# Patient Record
Sex: Male | Born: 1970
Health system: Southern US, Community
[De-identification: ages and names within clinical notes are randomized; demographics above are authoritative.]

## PROBLEM LIST (undated history)

## (undated) DIAGNOSIS — K5909 Other constipation: Secondary | ICD-10-CM

## (undated) DIAGNOSIS — R55 Syncope and collapse: Secondary | ICD-10-CM

## (undated) DIAGNOSIS — M109 Gout, unspecified: Secondary | ICD-10-CM

## (undated) DIAGNOSIS — E785 Hyperlipidemia, unspecified: Secondary | ICD-10-CM

## (undated) DIAGNOSIS — K219 Gastro-esophageal reflux disease without esophagitis: Secondary | ICD-10-CM

## (undated) DIAGNOSIS — G4733 Obstructive sleep apnea (adult) (pediatric): Secondary | ICD-10-CM

## (undated) DIAGNOSIS — Z9109 Other allergy status, other than to drugs and biological substances: Secondary | ICD-10-CM

## (undated) DIAGNOSIS — I10 Essential (primary) hypertension: Secondary | ICD-10-CM

## (undated) DIAGNOSIS — E669 Obesity, unspecified: Secondary | ICD-10-CM

## (undated) HISTORY — DX: Other constipation: K59.09

## (undated) HISTORY — DX: Other allergy status, other than to drugs and biological substances: Z91.09

## (undated) HISTORY — DX: Essential (primary) hypertension: I10

## (undated) HISTORY — DX: Obesity, unspecified: E66.9

## (undated) HISTORY — DX: Gastro-esophageal reflux disease without esophagitis: K21.9

## (undated) HISTORY — DX: Hyperlipidemia, unspecified: E78.5

## (undated) HISTORY — PX: VASECTOMY: SHX75

## (undated) HISTORY — DX: Syncope and collapse: R55

## (undated) HISTORY — DX: Obstructive sleep apnea (adult) (pediatric): G47.33

## (undated) HISTORY — DX: Gout, unspecified: M10.9

## (undated) HISTORY — PX: APPENDECTOMY: SHX54

## (undated) HISTORY — PX: OTHER SURGICAL HISTORY: SHX169

---

## 2005-01-08 ENCOUNTER — Emergency Department (HOSPITAL_COMMUNITY): Admission: EM | Admit: 2005-01-08 | Discharge: 2005-01-08 | Payer: Self-pay | Admitting: Family Medicine

## 2005-05-08 ENCOUNTER — Ambulatory Visit: Payer: Self-pay | Admitting: Internal Medicine

## 2005-07-24 ENCOUNTER — Ambulatory Visit: Payer: Self-pay | Admitting: Internal Medicine

## 2005-10-03 ENCOUNTER — Emergency Department (HOSPITAL_COMMUNITY): Admission: EM | Admit: 2005-10-03 | Discharge: 2005-10-03 | Payer: Self-pay | Admitting: Family Medicine

## 2006-05-11 ENCOUNTER — Ambulatory Visit: Payer: Self-pay | Admitting: Internal Medicine

## 2006-08-31 ENCOUNTER — Ambulatory Visit: Payer: Self-pay | Admitting: Internal Medicine

## 2006-09-16 ENCOUNTER — Emergency Department (HOSPITAL_COMMUNITY): Admission: EM | Admit: 2006-09-16 | Discharge: 2006-09-16 | Payer: Self-pay | Admitting: Emergency Medicine

## 2006-10-25 ENCOUNTER — Ambulatory Visit: Payer: Self-pay | Admitting: Internal Medicine

## 2006-12-16 ENCOUNTER — Emergency Department (HOSPITAL_COMMUNITY): Admission: EM | Admit: 2006-12-16 | Discharge: 2006-12-16 | Payer: Self-pay | Admitting: Family Medicine

## 2007-01-06 ENCOUNTER — Emergency Department (HOSPITAL_COMMUNITY): Admission: EM | Admit: 2007-01-06 | Discharge: 2007-01-06 | Payer: Self-pay | Admitting: Family Medicine

## 2007-06-01 ENCOUNTER — Ambulatory Visit: Payer: Self-pay | Admitting: Family Medicine

## 2007-07-04 ENCOUNTER — Encounter: Payer: Self-pay | Admitting: Internal Medicine

## 2007-07-04 DIAGNOSIS — I10 Essential (primary) hypertension: Secondary | ICD-10-CM | POA: Insufficient documentation

## 2007-07-04 DIAGNOSIS — E785 Hyperlipidemia, unspecified: Secondary | ICD-10-CM | POA: Insufficient documentation

## 2007-07-05 DIAGNOSIS — E669 Obesity, unspecified: Secondary | ICD-10-CM | POA: Insufficient documentation

## 2007-07-05 DIAGNOSIS — K219 Gastro-esophageal reflux disease without esophagitis: Secondary | ICD-10-CM | POA: Insufficient documentation

## 2008-01-09 ENCOUNTER — Ambulatory Visit: Payer: Self-pay | Admitting: Internal Medicine

## 2008-01-09 DIAGNOSIS — J309 Allergic rhinitis, unspecified: Secondary | ICD-10-CM | POA: Insufficient documentation

## 2008-01-11 ENCOUNTER — Telehealth (INDEPENDENT_AMBULATORY_CARE_PROVIDER_SITE_OTHER): Payer: Self-pay | Admitting: Family Medicine

## 2008-01-11 ENCOUNTER — Emergency Department (HOSPITAL_COMMUNITY): Admission: EM | Admit: 2008-01-11 | Discharge: 2008-01-11 | Payer: Self-pay | Admitting: Family Medicine

## 2008-01-19 ENCOUNTER — Emergency Department (HOSPITAL_COMMUNITY): Admission: EM | Admit: 2008-01-19 | Discharge: 2008-01-19 | Payer: Self-pay | Admitting: Emergency Medicine

## 2008-11-20 ENCOUNTER — Ambulatory Visit: Payer: Self-pay | Admitting: Internal Medicine

## 2008-11-20 DIAGNOSIS — R079 Chest pain, unspecified: Secondary | ICD-10-CM | POA: Insufficient documentation

## 2008-11-20 DIAGNOSIS — R0609 Other forms of dyspnea: Secondary | ICD-10-CM | POA: Insufficient documentation

## 2008-11-20 DIAGNOSIS — R0989 Other specified symptoms and signs involving the circulatory and respiratory systems: Secondary | ICD-10-CM

## 2008-11-26 ENCOUNTER — Encounter: Payer: Self-pay | Admitting: Internal Medicine

## 2008-11-30 ENCOUNTER — Ambulatory Visit: Payer: Self-pay

## 2008-11-30 ENCOUNTER — Encounter: Payer: Self-pay | Admitting: Internal Medicine

## 2008-12-01 ENCOUNTER — Emergency Department (HOSPITAL_COMMUNITY): Admission: EM | Admit: 2008-12-01 | Discharge: 2008-12-01 | Payer: Self-pay | Admitting: Emergency Medicine

## 2008-12-01 ENCOUNTER — Telehealth: Payer: Self-pay | Admitting: Internal Medicine

## 2009-02-23 ENCOUNTER — Ambulatory Visit: Payer: Self-pay | Admitting: Internal Medicine

## 2009-02-23 DIAGNOSIS — K5289 Other specified noninfective gastroenteritis and colitis: Secondary | ICD-10-CM | POA: Insufficient documentation

## 2009-02-26 ENCOUNTER — Ambulatory Visit: Payer: Self-pay | Admitting: Internal Medicine

## 2009-02-26 LAB — CONVERTED CEMR LAB
ALT: 39 units/L (ref 0–53)
AST: 24 units/L (ref 0–37)
Albumin: 3.8 g/dL (ref 3.5–5.2)
Alkaline Phosphatase: 59 units/L (ref 39–117)
BUN: 16 mg/dL (ref 6–23)
Basophils Absolute: 0 10*3/uL (ref 0.0–0.1)
Basophils Relative: 0.9 % (ref 0.0–3.0)
Bilirubin Urine: NEGATIVE
Bilirubin, Direct: 0.2 mg/dL (ref 0.0–0.3)
CO2: 30 meq/L (ref 19–32)
Calcium: 9.4 mg/dL (ref 8.4–10.5)
Chloride: 108 meq/L (ref 96–112)
Cholesterol: 170 mg/dL (ref 0–200)
Creatinine, Ser: 1 mg/dL (ref 0.4–1.5)
Eosinophils Absolute: 0.1 10*3/uL (ref 0.0–0.7)
Eosinophils Relative: 1.4 % (ref 0.0–5.0)
GFR calc non Af Amer: 89.3 mL/min (ref >60)
Glucose, Bld: 110 mg/dL — ABNORMAL HIGH (ref 70–99)
HCT: 38 % — ABNORMAL LOW (ref 39.0–52.0)
HDL: 29.5 mg/dL — ABNORMAL LOW (ref >39.00)
Hemoglobin: 13.2 g/dL (ref 13.0–17.0)
Ketones, ur: NEGATIVE mg/dL
LDL Cholesterol: 116 mg/dL — ABNORMAL HIGH (ref 0–99)
Leukocytes, UA: NEGATIVE
Lymphocytes Relative: 28.9 % (ref 12.0–46.0)
Lymphs Abs: 1.3 10*3/uL (ref 0.7–4.0)
MCHC: 34.8 g/dL (ref 30.0–36.0)
MCV: 87.3 fL (ref 78.0–100.0)
Monocytes Absolute: 0.7 10*3/uL (ref 0.1–1.0)
Monocytes Relative: 14.8 % — ABNORMAL HIGH (ref 3.0–12.0)
Neutro Abs: 2.3 10*3/uL (ref 1.4–7.7)
Neutrophils Relative %: 54 % (ref 43.0–77.0)
Nitrite: NEGATIVE
Platelets: 222 10*3/uL (ref 150.0–400.0)
Potassium: 4.3 meq/L (ref 3.5–5.1)
RBC: 4.35 M/uL (ref 4.22–5.81)
RDW: 12.7 % (ref 11.5–14.6)
Sodium: 143 meq/L (ref 135–145)
Specific Gravity, Urine: 1.025 (ref 1.000–1.030)
TSH: 1.8 microintl units/mL (ref 0.35–5.50)
Total Bilirubin: 0.8 mg/dL (ref 0.3–1.2)
Total CHOL/HDL Ratio: 6
Total Protein, Urine: NEGATIVE mg/dL
Total Protein: 7.1 g/dL (ref 6.0–8.3)
Triglycerides: 124 mg/dL (ref 0.0–149.0)
Urine Glucose: NEGATIVE mg/dL
Urobilinogen, UA: 0.2 (ref 0.0–1.0)
VLDL: 24.8 mg/dL (ref 0.0–40.0)
WBC: 4.4 10*3/uL — ABNORMAL LOW (ref 4.5–10.5)
pH: 5.5 (ref 5.0–8.0)

## 2009-05-02 ENCOUNTER — Emergency Department (HOSPITAL_COMMUNITY): Admission: EM | Admit: 2009-05-02 | Discharge: 2009-05-02 | Payer: Self-pay | Admitting: Family Medicine

## 2009-10-13 ENCOUNTER — Ambulatory Visit: Payer: Self-pay | Admitting: Internal Medicine

## 2009-10-13 DIAGNOSIS — M25569 Pain in unspecified knee: Secondary | ICD-10-CM | POA: Insufficient documentation

## 2009-10-24 ENCOUNTER — Ambulatory Visit (HOSPITAL_BASED_OUTPATIENT_CLINIC_OR_DEPARTMENT_OTHER): Admission: RE | Admit: 2009-10-24 | Discharge: 2009-10-24 | Payer: Self-pay | Admitting: Otolaryngology

## 2009-10-31 ENCOUNTER — Ambulatory Visit: Payer: Self-pay | Admitting: Internal Medicine

## 2009-12-27 ENCOUNTER — Ambulatory Visit: Payer: Self-pay | Admitting: Endocrinology

## 2010-04-01 ENCOUNTER — Ambulatory Visit: Payer: Self-pay | Admitting: Internal Medicine

## 2010-04-02 LAB — CONVERTED CEMR LAB
ALT: 40 units/L (ref 0–53)
AST: 25 units/L (ref 0–37)
Albumin: 4 g/dL (ref 3.5–5.2)
Alkaline Phosphatase: 44 units/L (ref 39–117)
BUN: 18 mg/dL (ref 6–23)
Basophils Absolute: 0 10*3/uL (ref 0.0–0.1)
Basophils Relative: 0.5 % (ref 0.0–3.0)
Bilirubin Urine: NEGATIVE
Bilirubin, Direct: 0.2 mg/dL (ref 0.0–0.3)
CO2: 31 meq/L (ref 19–32)
Calcium: 9.6 mg/dL (ref 8.4–10.5)
Chloride: 107 meq/L (ref 96–112)
Cholesterol: 208 mg/dL — ABNORMAL HIGH (ref 0–200)
Creatinine, Ser: 1 mg/dL (ref 0.4–1.5)
Direct LDL: 139.3 mg/dL
Eosinophils Absolute: 0.1 10*3/uL (ref 0.0–0.7)
Eosinophils Relative: 1.5 % (ref 0.0–5.0)
GFR calc non Af Amer: 86.77 mL/min (ref >60)
Glucose, Bld: 113 mg/dL — ABNORMAL HIGH (ref 70–99)
HCT: 38.7 % — ABNORMAL LOW (ref 39.0–52.0)
HDL: 41.7 mg/dL (ref >39.00)
Hemoglobin: 13.1 g/dL (ref 13.0–17.0)
Ketones, ur: NEGATIVE mg/dL
Leukocytes, UA: NEGATIVE
Lymphocytes Relative: 24.8 % (ref 12.0–46.0)
Lymphs Abs: 1.5 10*3/uL (ref 0.7–4.0)
MCHC: 33.9 g/dL (ref 30.0–36.0)
MCV: 86.8 fL (ref 78.0–100.0)
Monocytes Absolute: 0.6 10*3/uL (ref 0.1–1.0)
Monocytes Relative: 8.9 % (ref 3.0–12.0)
Neutro Abs: 4 10*3/uL (ref 1.4–7.7)
Neutrophils Relative %: 64.3 % (ref 43.0–77.0)
Nitrite: NEGATIVE
Platelets: 220 10*3/uL (ref 150.0–400.0)
Potassium: 4.4 meq/L (ref 3.5–5.1)
RBC: 4.45 M/uL (ref 4.22–5.81)
RDW: 13.7 % (ref 11.5–14.6)
Sodium: 143 meq/L (ref 135–145)
Specific Gravity, Urine: >=1.03 (ref 1.000–1.030)
TSH: 1.69 microintl units/mL (ref 0.35–5.50)
Total Bilirubin: 0.9 mg/dL (ref 0.3–1.2)
Total CHOL/HDL Ratio: 5
Total Protein, Urine: NEGATIVE mg/dL
Total Protein: 6.6 g/dL (ref 6.0–8.3)
Triglycerides: 178 mg/dL — ABNORMAL HIGH (ref 0.0–149.0)
Urine Glucose: NEGATIVE mg/dL
Urobilinogen, UA: 0.2 (ref 0.0–1.0)
VLDL: 35.6 mg/dL (ref 0.0–40.0)
WBC: 6.2 10*3/uL (ref 4.5–10.5)
pH: 5.5 (ref 5.0–8.0)

## 2010-04-08 ENCOUNTER — Ambulatory Visit: Payer: Self-pay | Admitting: Internal Medicine

## 2010-04-20 ENCOUNTER — Ambulatory Visit: Payer: Self-pay | Admitting: Internal Medicine

## 2010-07-22 ENCOUNTER — Telehealth: Payer: Self-pay | Admitting: Internal Medicine

## 2010-11-02 ENCOUNTER — Telehealth: Payer: Self-pay | Admitting: Internal Medicine

## 2010-12-08 NOTE — Assessment & Plan Note (Signed)
Summary: back/knee pain/#/cd   Vital Signs:  Patient profile:   40 year old male Height:      74 inches Weight:      267 pounds BMI:     34.40 O2 Sat:      97 % on Room air Temp:     98.9 degrees F oral Pulse rate:   70 / minute BP sitting:   130 / 90  (left arm) Cuff size:   large  Vitals Entered ByZella Ball Ewing (October 13, 2009 2:57 PM)  O2 Flow:  Room air CC: back and knee pain/RE   CC:  back and knee pain/RE.  History of Present Illness: here with gradually worsening now moderate chronic persistent right knee pain medially , worse with stooping and bending and standing;  no swelling , falls , fever or other trauma;  denies seeming unstable, or click or catch;  also c/o one year gradually worsening lower back pain now moderate as well with occasinal radiation to both legs but wihtout LE numbness or weakness or falls;  no bowel or bladder changes , fever, wt loss, night sweats.  Admits he thinks his wt overall up in past years may be an issue with his pain.  Back pain worse to bend, housework and lifting up to 50 lbs.  Also mentions the benicar is expensive and would like change if possible.  Pt denies CP, sob, doe, wheezing, orthopnea, pnd, worsening LE edema, palps, dizziness or syncope   Pt denies other new neuro symptoms such as headache, facial or extremity weakness   Problems Prior to Update: 1)  Knee Pain, Right  (ICD-719.46) 2)  Back Pain  (ICD-724.5) 3)  Gastroenteritis, Acute  (ICD-558.9) 4)  Syncope  (ICD-780.2) 5)  Preventive Health Care  (ICD-V70.0) 6)  Chest Pain  (ICD-786.50) 7)  Snoring  (ICD-786.09) 8)  Allergic Rhinitis  (ICD-477.9) 9)  Gerd  (ICD-530.81) 10)  Obesity  (ICD-278.00) 11)  Hypertension  (ICD-401.9) 12)  Hyperlipidemia  (ICD-272.4)  Medications Prior to Update: 1)  Adult Aspirin Ec Low Strength 81 Mg Tbec (Aspirin) .Marland Kitchen.. 1 Po Once Daily 2)  Benicar 40 Mg Tabs (Olmesartan Medoxomil) .Marland Kitchen.. 1 By Mouth Once Daily 3)  Lansoprazole 30 Mg Cpdr  (Lansoprazole) .Marland Kitchen.. 1 By Mouth Once Daily  Current Medications (verified): 1)  Adult Aspirin Ec Low Strength 81 Mg Tbec (Aspirin) .Marland Kitchen.. 1 Po Once Daily 2)  Losartan Potassium 50 Mg Tabs (Losartan Potassium) .Marland Kitchen.. 1po Once Daily 3)  Lansoprazole 30 Mg Cpdr (Lansoprazole) .Marland Kitchen.. 1 By Mouth Once Daily 4)  Naproxen 500 Mg Tabs (Naproxen) .Marland Kitchen.. 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   Head:  normocephalic and atraumatic.   Eyes:  vision grossly intact, pupils equal, and pupils round.   Ears:  R ear normal and L ear normal.   Nose:  no external deformity and no nasal discharge.   Mouth:  no gingival abnormalities and pharynx pink and moist.   Neck:  supple and no masses.   Lungs:  normal respiratory effort and normal breath sounds.   Heart:  normal rate and regular rhythm.   Msk:  no joint tenderness and no joint swelling., has diffuse lumbar paravertebral tender but no spine tenderness or swelling;  no buttock tender or post thigh tender; no rash or erythema ; right knee with mild joint effusion but NT and FROM, no click or catch apprec but has some crepitus Extremities:  no edema, no erythema    Impression & Recommendations:  Problem # 1:  BACK PAIN (ICD-724.5)  His updated medication list for this problem includes:    Adult Aspirin Ec Low Strength 81 Mg Tbec (Aspirin) .Marland Kitchen... 1 po once daily    Naproxen 500 Mg Tabs (Naproxen) .Marland Kitchen... 1 by mouth two times a day as  needed  Orders: Orthopedic Surgeon Referral (Ortho Surgeon) etiology unclear , treat as above, f/u any worsening signs or symptoms , refer ortho  Problem # 2:  KNEE PAIN, RIGHT (ICD-719.46)  His updated medication list for this problem includes:    Adult Aspirin Ec Low Strength 81 Mg Tbec (Aspirin) .Marland Kitchen... 1 po once daily    Naproxen 500 Mg Tabs (Naproxen) .Marland Kitchen... 1 by mouth two times a day as needed  Orders: Orthopedic Surgeon Referral (Ortho Surgeon) exact eiology unclear; treat as above, f/u any worsening signs or symptoms , refer ortho  Problem # 3:  HYPERTENSION (ICD-401.9)  His updated medication list for this problem includes:    Losartan Potassium 50 Mg Tabs (Losartan potassium) .Marland Kitchen... 1po once daily  BP today: 130/90 Prior BP: 110/70 (02/23/2009)  Labs Reviewed: K+: 4.3 (02/26/2009) Creat: : 1.0 (02/26/2009)   Chol: 170 (02/26/2009)   HDL: 29.50 (02/26/2009)   LDL: 116 (02/26/2009)   TG: 124.0 (02/26/2009) stable overall by hx and exam, ok to continue meds/tx as is   Complete Medication List: 1)  Adult Aspirin Ec Low Strength 81 Mg Tbec (Aspirin) .Marland Kitchen.. 1 po once daily 2)  Losartan Potassium 50 Mg Tabs (Losartan potassium) .Marland Kitchen.. 1po once daily 3)  Lansoprazole 30 Mg Cpdr (Lansoprazole) .Marland Kitchen.. 1 by mouth once daily 4)  Naproxen 500 Mg Tabs (Naproxen) .Marland Kitchen.. 1 by mouth two times a day as needed  Patient Instructions: 1)  change the benicar to losartan 50 mg per day 2)  Please take all new medications as prescribed 3)  Continue all previous medications as before this visit  4)  You will be contacted about the referral(s) to: Orthopedic 5)  Please schedule a follow-up appointment in 6 months with CPX labs Prescriptions: LOSARTAN POTASSIUM 50 MG TABS (LOSARTAN POTASSIUM) 1po once daily  #30 x 11   Entered and Authorized by:   Corwin Levins MD   Signed by:   Corwin Levins MD on 10/13/2009   Method used:   [facility] to        [facility]  [address] [phone][facility] (retail)       [address]       [place], [place]  [address]       Ph: [phone] or [phone]       Fax: [phone]   RxID:   [id number] NAPROXEN 500 MG TABS (NAPROXEN) 1 by mouth two times a day as needed  #60 x 2   Entered and Authorized by:   Corwin Levins MD   Signed by:   Corwin Levins MD on 10/13/2009   Method used:   [facility]  [address] [phone][facility] (retail)       [address]       [place], [place]  [address]       Ph: [phone] or [phone]       Fax: [phone]   RxID:   [id number]

## 2010-12-08 NOTE — Assessment & Plan Note (Signed)
Summary: WIFE STATES HE NEEDS A SLEEP STUDY/NWS   Vital Signs:  Patient Profile:   40 Years Old Male Weight:      270 pounds O2 Sat:      97 % O2 treatment:    Room Air Temp:     97.8 degrees F oral Pulse rate:   78 / minute BP sitting:   138 / 92  (left arm) Cuff size:   large  Vitals Entered By: Payton Spark CMA (November 20, 2008 8:45 AM)                 Chief Complaint:  wife requests sleep study.  History of Present Illness: BP at home and at the drug store usually over 140 to 150; admits to not following diet and non compliant with med but wife says he needs to get back on track with diet, excercise, meds and wt loss (wife is EMT); he has also had some mid and left chest discomfort, pressure, sharp and dull , not clearly worse with exertion and not pleuritic, has some left shoulder pain and not clear if associated; not assoc with sob, doe, diaphoresis, palp's or syncope; has quite a bit of snoring at night but not significant daytime somnolence per pt; intends to start with better diet, excercise and wt loss efforts    Prior Medications Reviewed Using: Patient Recall  Updated Prior Medication List: ADULT ASPIRIN EC LOW STRENGTH 81 MG TBEC (ASPIRIN) 1 po once daily BENICAR 40 MG TABS (OLMESARTAN MEDOXOMIL) 1 by mouth once daily  Current Allergies (reviewed today): No known allergies   Past Medical History:    Reviewed history from 01/09/2008 and no changes required:       Hyperlipidemia       Hypertension       Obesity       GERD       Allergic rhinitis       eczema  Past Surgical History:    Reviewed history from 07/04/2007 and no changes required:       Appendectomy   Family History:    Reviewed history from 01/09/2008 and no changes required:       stroke       DM       heart disease  Social History:    Reviewed history from 01/09/2008 and no changes required:       Married       Former Smoker       Alcohol use-yes       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   Head:     Normocephalic and atraumatic without obvious abnormalities. No apparent alopecia or balding. Eyes:     No corneal or conjunctival inflammation noted. EOMI. Perrla Ears:     External ear exam shows no significant lesions or deformities.  Otoscopic examination reveals clear canals, tympanic membranes are intact bilaterally without bulging, retraction, inflammation or discharge. Hearing is grossly normal bilaterally. Nose:     External nasal examination shows no deformity or inflammation. Nasal mucosa are pink and moist without lesions or exudates. Mouth:     Oral mucosa and oropharynx without lesions or exudates.  Teeth in good repair. Neck:     No deformities, masses, or tenderness noted. Lungs:     Normal respiratory effort, chest expands symmetrically. Lungs are  clear to auscultation, no crackles or wheezes. Heart:     Normal rate and regular rhythm. S1 and S2 normal without gallop, murmur, click, rub or other extra sounds. Abdomen:     Bowel sounds positive,abdomen soft and non-tender without masses, organomegaly or hernias noted. Msk:     no joint tenderness and no joint swelling.   Extremities:     no edema, no ulcers  Neurologic:     cranial nerves II-XII intact and strength normal in all extremities.      Impression & Recommendations:  Problem # 1:  CHEST PAIN (ICD-786.50) atypical pain - will refer for stress test Orders: Misc. Referral (Misc. Ref) EKG w/ Interpretation (93000)   Problem # 2:  SNORING (ICD-786.09) refer ENT - dr Ezzard Standing per pt request Orders: ENT Referral (ENT)   Problem # 3:  HYPERTENSION (ICD-401.9)  The following medications were removed from the medication list:    Benicar 40 Mg Tabs (Olmesartan medoxomil) .Marland Kitchen... Take 1 tablet by mouth once a day  His updated medication list for this  problem includes:    Benicar 40 Mg Tabs (Olmesartan medoxomil) .Marland Kitchen... 1 by mouth once daily to re-start med, check BP at home  Problem # 4:  HYPERLIPIDEMIA (ICD-272.4)  The following medications were removed from the medication list:    Simvastatin 40 Mg Tabs (Simvastatin) .Marland Kitchen... 1 by mouth qhs to hold statin for now, but re-check lipids with next visit  Complete Medication List: 1)  Adult Aspirin Ec Low Strength 81 Mg Tbec (Aspirin) .Marland Kitchen.. 1 po once daily 2)  Benicar 40 Mg Tabs (Olmesartan medoxomil) .Marland Kitchen.. 1 by mouth once daily   Patient Instructions: 1)  You will be contacted about the referral(s) to: ENT, and the stress test 2)  Take an Aspirin every day - 81 mg - 1 per day - COATED only 3)  re-start the benicar40 mg at HALF pill per day, but if blood pressure not downby at least 10 pts on the top number in 1 wk, you should increase to the 40 mg dose 4)  Please schedule a follow-up appointment in 2 months with CPX labs    Prescriptions: BENICAR 40 MG TABS (OLMESARTAN MEDOXOMIL) 1 by mouth once daily  #90 x 3   Entered and Authorized by:   Corwin Levins MD   Signed by:   Corwin Levins MD on 11/20/2008   Method used:   Print then Give to Patient   RxID:   [id number] BENICAR 40 MG TABS (OLMESARTAN MEDOXOMIL) 1 by mouth once daily  #30 x 11   Entered and Authorized by:   Corwin Levins MD   Signed by:   Corwin Levins MD on 11/20/2008   Method used:   [facility]  [address] [phone][facility] (retail)       [address]       [place], [place]  [address]       Ph: [phone] or [phone]       Fax: [phone]   RxID:   [id number]

## 2010-12-08 NOTE — Progress Notes (Signed)
Summary: Rx req  Phone Note Call from Patient Call back at Work Phone [phone]   Caller: Patient Summary of Call: Pt called stating he has hemorrhoids and has tried OTC creams with no relief. Pt is requesting Rx treatment to pharmacy, please advise Initial call taken by: Margaret Pyle, CMA,  November 02, 2010 3:05 PM  Follow-up for Phone Call        done per emr Follow-up by: Corwin Levins MD,  November 02, 2010 3:16 PM  Additional Follow-up for Phone Call Additional follow up Details #1::        pt advised Additional Follow-up by: Margaret Pyle, CMA,  November 02, 2010 3:54 PM    New/Updated Medications: PROCTOFOAM HC 1-1 % FOAM (HYDROCORTISONE ACE-PRAMOXINE) use asd two times a day as needed Prescriptions: PROCTOFOAM HC 1-1 % FOAM (HYDROCORTISONE ACE-PRAMOXINE) use asd two times a day as needed  #1 x 1   Entered and Authorized by:   Corwin Levins MD   Signed by:   Corwin Levins MD on 11/02/2010   Method used:   [facility]  [address] [phone]* (retail)       [address]       [place], [place]  [address]       Ph: [phone] or [phone]       Fax: [phone]   RxID:   [id number]

## 2010-12-08 NOTE — Progress Notes (Signed)
  Phone Note Call from Patient   Caller: Patient Call For: Corwin Levins MD Summary of Call: Patient called on triage at 9:57 am  requesting to get labs scheduled.  He had labs done in May of 2011. His call back number is [phone] and his home number is [phone].  Initial call taken by: Robin Ewing CMA Duncan Dull),  July 22, 2010 10:11 AM  Follow-up for Phone Call        we had not planned on repeat labs, but he did have slight elevate blood sugar and mild elev chol -   is this was he means for labs? Follow-up by: Corwin Levins MD,  July 22, 2010 10:37 AM  Additional Follow-up for Phone Call Additional follow up Details #1::        called pt. at both number left msg. to return call. Additional Follow-up by: Robin Ewing CMA Duncan Dull),  July 22, 2010 11:20 AM    Additional Follow-up for Phone Call Additional follow up Details #2::    called pt. and he is wanting Cholesterol panel and check BS. He has been excising and eating better wanted to have labs checked again. Also would like to schedule an OV after labs are completed to discuss. Follow-up by: Zella Ball Ewing CMA Duncan Dull),  July 22, 2010 12:09 PM  Additional Follow-up for Phone Call Additional follow up Details #3:: Details for Additional Follow-up Action Taken: ok for bmet and hgba1c - 790.2 , adn lipids 272.0 Additional Follow-up by: Corwin Levins MD,  July 22, 2010 1:04 PM   Appended Document:  called patient and scheduled pt. for labs the week of July 25, 2010. Patient will call back to schedule a followup OV for the lab results.

## 2010-12-08 NOTE — Assessment & Plan Note (Signed)
Summary: nausea/ear infection/lb   Vital Signs:  Patient profile:   40 year old male Height:      74 inches (187.96 cm) Weight:      275.38 pounds (125.17 kg) O2 Sat:      95 % on Room air Temp:     100.4 degrees F (38 degrees C) oral Pulse rate:   94 / minute BP sitting:   122 / 72  (left arm) Cuff size:   large  Vitals Entered By: Josph Macho RMA (December 27, 2009 4:05 PM)  O2 Flow:  Room air CC: pt states he is having right knee pain- would like a referral/ pt states he has his medication at home but doesn't take any of it/ pt also states he has sinus pressure, chills, and a little nauseous/ CF Is Patient Diabetic? No   CC:  pt states he is having right knee pain- would like a referral/ pt states he has his medication at home but doesn't take any of it/ pt also states he has sinus pressure, chills, and and a little nauseous/ CF.  History of Present Illness: pt states 1 day of fever and associated pain at left ear.  also has slight nausea. he also reports moderate pain at the right knee.  no recent injury.  it has recurred since dr dr Jonny Ruiz saw it in the past. he does not take bp meds as rx'ed.  Current Medications (verified): 1)  Adult Aspirin Ec Low Strength 81 Mg Tbec (Aspirin) .Marland Kitchen.. 1 Po Once Daily 2)  Losartan Potassium 50 Mg Tabs (Losartan Potassium) .Marland Kitchen.. 1po Once Daily 3)  Lansoprazole 30 Mg Cpdr (Lansoprazole) .Marland Kitchen.. 1 By Mouth Once Daily 4)  Naproxen 500 Mg Tabs (Naproxen) .Marland Kitchen.. 1 By Mouth Two Times A Day As Needed  Allergies (verified): No Known Drug Allergies  Past History:  Past Medical History: Last updated: 01/09/2008 Hyperlipidemia Hypertension Obesity GERD Allergic rhinitis eczema  Review of Systems       he has a slightly prod cough.  no vomiting  Physical Exam  General:  obese.  no distress  Head:  head: no deformity eyes: no periorbital swelling, no proptosis external nose and ears are normal mouth: no lesion seen.  the pharynx is red  and swollen, but the airway is patent Ears:  left tm is very red, and has fluid Neck:  supple Lungs:  Clear to auscultation bilaterally. Normal respiratory effort.  Msk:  right knee: normal, except for tenderness at the inferomedial aspect.   Impression & Recommendations:  Problem # 1:  uri new  Problem # 2:  HYPERTENSION (ICD-401.9) it is difficult to eval this when pt is sick  Problem # 3:  KNEE PAIN, RIGHT (ICD-719.46) recurrent  Medications Added to Medication List This Visit: 1)  Cefuroxime Axetil 250 Mg Tabs (Cefuroxime axetil) .Marland Kitchen.. 1 two times a day 2)  Benzonatate 200 Mg Caps (Benzonatate) .Marland Kitchen.. 1 three times a day as needed for cough  Other Orders: Orthopedic Surgeon Referral (Ortho Surgeon) Est. Patient Level IV ([address])  Patient Instructions: 1)  take tylenol and fluids 2)  cefuroxime 250 mg two times a day 3)  loratadine-d (non-prescription) as needed for congestion 4)  benzonatate 200 mg three times a day as needed cough 5)  refer orthpedics 6)  in view of your current illness, your blood pressure cannot be accurately evaluated.  therefore, stay-off the losartan for now, and have a blood pressure check in 2 weeks. Prescriptions: BENZONATATE 200 MG CAPS (  BENZONATATE) 1 three times a day as needed for cough  #30 x 1   Entered and Authorized by:   Minus Breeding MD   Signed by:   Minus Breeding MD on 12/27/2009   Method used:   [facility]        [facility] [address] [phone]* (retail)       [address]       [place], [place]  [id number]       Ph: [phone] or [phone]       Fax: [phone]   RxID:   [id number] CEFUROXIME AXETIL 250 MG TABS (CEFUROXIME AXETIL) 1 two times a day  #14 x 0   Entered and Authorized by:   Minus Breeding MD   Signed by:   Minus Breeding MD on 12/27/2009   Method used:   [facility]        [facility]  [address] [phone]* (retail)       [address]       [place], [place]  [address]       Ph: [phone] or [phone]       Fax: [phone]   RxID:   [id number]

## 2010-12-08 NOTE — Assessment & Plan Note (Signed)
Summary: FU-MED REFILL-PER PT-STC  Medications Added BENICAR 40 MG TABS (OLMESARTAN MEDOXOMIL) Take 1 tablet by mouth once a day SIMVASTATIN 40 MG  TABS (SIMVASTATIN) 1 by mouth qhs ECOTRIN LOW STRENGTH 81 MG  TBEC (ASPIRIN) 1 by mouth qd      Allergies Added: NKDA  Vital Signs:  Patient Profile:   40 Years Old Male Weight:      279 pounds Temp:     97.8 degrees F oral Pulse rate:   73 / minute BP sitting:   142 / 85  (left arm) Cuff size:   large  Pt. in pain?   no  Vitals Entered By: Maris Berger (January 09, 2008 8:33 AM)                  Chief Complaint:  med refill.  History of Present Illness: overall doing well, no complaints    Updated Prior Medication List: BENICAR 40 MG TABS (OLMESARTAN MEDOXOMIL) Take 1 tablet by mouth once a day SIMVASTATIN 40 MG  TABS (SIMVASTATIN) 1 by mouth qhs ECOTRIN LOW STRENGTH 81 MG  TBEC (ASPIRIN) 1 by mouth qd  Current Allergies (reviewed today): No known allergies   Past Medical History:    Reviewed history from 07/05/2007 and no changes required:       Hyperlipidemia       Hypertension       Obesity       GERD       Allergic rhinitis       eczema  Past Surgical History:    Reviewed history from 07/04/2007 and no changes required:       Appendectomy   Family History:    Reviewed history and no changes required:       stroke       DM       heart disease  Social History:    Reviewed history and no changes required:       Married       Former Smoker       Alcohol use-yes       2 children       Mining engineer   Risk Factors:  Tobacco use:  quit Alcohol use:  yes   Review of Systems  The patient denies anorexia, fever, weight loss, weight gain, vision loss, decreased hearing, hoarseness, chest pain, syncope, dyspnea on exhertion, peripheral edema, prolonged cough, hemoptysis, abdominal pain, melena, hematochezia, severe indigestion/heartburn, hematuria, incontinence, genital sores,  muscle weakness, suspicious skin lesions, transient blindness, difficulty walking, depression, unusual weight change, abnormal bleeding, enlarged lymph nodes, angioedema, breast masses, and testicular masses.     Physical Exam  General:     Well-developed,well-nourished,in no acute distress; alert,appropriate and cooperative throughout examination Head:     Normocephalic and atraumatic without obvious abnormalities. No apparent alopecia or balding. Eyes:     No corneal or conjunctival inflammation noted. EOMI. Perrla. Ears:     External ear exam shows no significant lesions or deformities.  Otoscopic examination reveals clear canals, tympanic membranes are intact bilaterally without bulging, retraction, inflammation or discharge. Hearing is grossly normal bilaterally. Nose:     External nasal examination shows no deformity or inflammation. Nasal mucosa are pink and moist without lesions or exudates. Mouth:     Oral mucosa and oropharynx without lesions or exudates.  Teeth in good repair. Neck:     No deformities, masses, or tenderness noted. Lungs:     Normal respiratory effort, chest expands symmetrically.  Lungs are clear to auscultation, no crackles or wheezes. Heart:     Normal rate and regular rhythm. S1 and S2 normal without gallop, murmur, click, rub or other extra sounds. Abdomen:     Bowel sounds positive,abdomen soft and non-tender without masses, organomegaly or hernias noted. Genitalia:     Testes bilaterally descended without nodularity, tenderness or masses. No scrotal masses or lesions. No penis lesions or urethral discharge. Msk:     No deformity or scoliosis noted of thoracic or lumbar spine.   Extremities:     No clubbing, cyanosis, edema, or deformity noted with normal full range of motion of all joints.   Neurologic:     No cranial nerve deficits noted. Station and gait are normal. Plantar reflexes are down-going bilaterally. DTRs are symmetrical throughout.  Sensory, motor and coordinative functions appear intact.    Impression & Recommendations:  Problem # 1:  Preventive Health Care (ICD-V70.0) Overall doing well, up to date, counseled on routine health concerns for screening and prevention, immunizations up to date or declined, labs ordered   Problem # 2:  HYPERTENSION (ICD-401.9)  His updated medication list for this problem includes:    Benicar 40 Mg Tabs (Olmesartan medoxomil) .Marland Kitchen... Take 1 tablet by mouth once a day  BP today: 142/85 Prior BP: 120/76 (06/01/2007) stable, cont meds as is  Problem # 3:  HYPERLIPIDEMIA (ICD-272.4)  His updated medication list for this problem includes:    Simvastatin 40 Mg Tabs (Simvastatin) .Marland Kitchen... 1 by mouth qhs to start med, f/u labs   Complete Medication List: 1)  Benicar 40 Mg Tabs (Olmesartan medoxomil) .... Take 1 tablet by mouth once a day 2)  Simvastatin 40 Mg Tabs (Simvastatin) .Marland Kitchen.. 1 by mouth qhs 3)  Ecotrin Low Strength 81 Mg Tbec (Aspirin) .Marland Kitchen.. 1 by mouth qd   Patient Instructions: 1)  continue all other medications that you may have been taking previously 2)  please return in 3 weeks for blood work only: 3)  CPX labs - v70.0    Prescriptions: SIMVASTATIN 40 MG  TABS (SIMVASTATIN) 1 by mouth qhs  #90 x 3   Entered and Authorized by:   Corwin Levins MD   Signed by:   Corwin Levins MD on 01/09/2008   Method used:   Print then Give to Patient   RxID:   [id number] BENICAR 40 MG TABS (OLMESARTAN MEDOXOMIL) Take 1 tablet by mouth once a day  #90 x 3   Entered and Authorized by:   Corwin Levins MD   Signed by:   Corwin Levins MD on 01/09/2008   Method used:   Print then Give to Patient   RxID:   [id number] SIMVASTATIN 40 MG  TABS (SIMVASTATIN) 1 by mouth qhs  #30 x 11   Entered and Authorized by:   Corwin Levins MD   Signed by:   Corwin Levins MD on 01/09/2008   Method used:   Electron[facility] sent to ...       [facility] [address] [address] [address] [phone]*       [address]        [place], [place]  [address]       Ph: [phone] or [phone]       Fax: [phone]   RxID:   [id number] BENICAR 40 MG TABS (OLMESARTAN MEDOXOMIL) Take 1 tablet by mouth once a day  #30 x 11  Entered and Authorized by:   Corwin Levins MD   Signed by:   Corwin Levins MD on 01/09/2008   Method used:   Electronically sent to ...       [facility] [address] [phone]*       [address]       [place], [place]  [address]       Ph: [phone] or [phone]       Fax: [phone]   RxID:   [id number]  ]

## 2010-12-08 NOTE — Assessment & Plan Note (Signed)
Summary: physical---stc   Vital Signs:  Patient profile:   40 year old male Height:      74 inches Weight:      273 pounds BMI:     35.18 O2 Sat:      97 % on Room air Temp:     98.4 degrees F oral Pulse rate:   59 / minute BP sitting:   130 / 78  (left arm) Cuff size:   large  Vitals Entered ByMarland Kitchen Zella Ball Ewing (April 08, 2010 10:37 AM)  O2 Flow:  Room air   History of Present Illness: here for general exam,  overall doing well, Pt denies CP, sob, doe, wheezing, orthopnea, pnd, worsening LE edema, palps, dizziness or syncope  Pt denies new neuro symptoms such as headache, facial or extremity weakness  Denies polydipsia, poluria, has been trying to follow better diet, excercsing at the gym four times per wk now, on top of family and work.  No wt loss yet, but pants seem bigger.  No OSA symptoms now with adequate treatment.    Preventive Screening-Counseling & Management      Drug Use:  no.    Problems Prior to Update: 1)  Knee Pain, Right  (ICD-719.46) 2)  Back Pain  (ICD-724.5) 3)  Gastroenteritis, Acute  (ICD-558.9) 4)  Syncope  (ICD-780.2) 5)  Preventive Health Care  (ICD-V70.0) 6)  Chest Pain  (ICD-786.50) 7)  Snoring  (ICD-786.09) 8)  Allergic Rhinitis  (ICD-477.9) 9)  Gerd  (ICD-530.81) 10)  Obesity  (ICD-278.00) 11)  Hypertension  (ICD-401.9) 12)  Hyperlipidemia  (ICD-272.4)  Medications Prior to Update: 1)  Adult Aspirin Ec Low Strength 81 Mg Tbec (Aspirin) .Marland Kitchen.. 1 Po Once Daily 2)  Lansoprazole 30 Mg Cpdr (Lansoprazole) .Marland Kitchen.. 1 By Mouth Once Daily 3)  Naproxen 500 Mg Tabs (Naproxen) .Marland Kitchen.. 1 By Mouth Two Times A Day As Needed 4)  Cefuroxime Axetil 250 Mg Tabs (Cefuroxime Axetil) .Marland Kitchen.. 1 Two Times A Day 5)  Benzonatate 200 Mg Caps (Benzonatate) .Marland Kitchen.. 1 Three Times A Day As Needed For Cough  Current Medications (verified): 1)  Adult Aspirin Ec Low Strength 81 Mg Tbec (Aspirin) .Marland Kitchen.. 1 Po Once Daily 2)  Fish Oil 1000 Mg Caps (Omega-3 Fatty Acids) .... 2 By Mouth Once  Daily 3)  Glucosamine-Chondroitin 500-400 Mg Caps (Glucosamine-Chondroitin) .Marland Kitchen.. 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 Hyperlipidemia Hypertension Obesity GERD Allergic rhinitis eczema  Past Surgical History: Appendectomy s/p right knee arthroscopy - [place] [facility]  Social History: Reviewed history from 01/09/2008 and no changes required. 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         all otherwise negative per pt -    Physical Exam  General:  alert and overweight-appearing.   Head:  normocephalic and atraumatic.   Eyes:  vision grossly intact, pupils equal, and pupils round.   Ears:  R ear normal and L ear normal.   Nose:  no external deformity and no nasal discharge.   Mouth:  no gingival abnormalities and pharynx pink and moist.   Neck:  supple and no masses.   Lungs:  normal respiratory effort and normal breath sounds.   Heart:  normal rate and regular rhythm.   Abdomen:  soft, non-tender, and normal bowel sounds.   Msk:  no joint tenderness and no joint swelling.    Extremities:  no edema, no erythema  Neurologic:  cranial nerves II-XII intact and strength normal in all extremities.   Skin:  color normal and no rashes.   Psych:  memory intact for recent and remote and normally interactive.     Impression & Recommendations:  Problem # 1:  Preventive Health Care (ICD-V70.0) Overall doing well, age appropriate education and counseling updated and referral for appropriate preventive services done unless declined, immunizations up to date or declined, diet counseling done if overweight, urged to quit smoking if smokes , most recent labs reviewed and current ordered if appropriate, ecg reviewed or declined (interpretation per ECG scanned in the EMR if done); information regarding Medicare Prevention requirements given if appropriate; speciality referrals updated as appropriate   Problem # 2:  HYPERTENSION (ICD-401.9)  BP today: 130/78 Prior BP: 122/72 (12/27/2009)  Labs Reviewed: K+: 4.4 (04/01/2010) Creat: : 1.0 (04/01/2010)   Chol: 208 (04/01/2010)   HDL: 41.70 (04/01/2010)   LDL: 116 (02/26/2009)   TG: 178.0 (04/01/2010) stable overall by hx and exam, ok to continue meds/tx as is - no meds needed at this time, cont low salt, better diet, wt loss  Problem # 3:  HYPERLIPIDEMIA (ICD-272.4)  Labs Reviewed: SGOT: 25 (04/01/2010)   SGPT: 40 (04/01/2010)   HDL:41.70 (04/01/2010), 29.50 (02/26/2009)  LDL:116 (02/26/2009)  Chol:208 (04/01/2010), 170 (02/26/2009)  Trig:178.0 (04/01/2010), 124.0 (02/26/2009) stable overall by hx and exam, ok to continue meds/tx as is - Pt to continue diet efforts,  - goal LDL less than 100  Complete Medication List: 1)  Adult Aspirin Ec Low Strength 81 Mg Tbec (Aspirin) .Marland Kitchen.. 1 po once daily 2)  Fish Oil 1000 Mg Caps (Omega-3 fatty acids) .... 2 by mouth once daily 3)  Glucosamine-chondroitin 500-400 Mg Caps (Glucosamine-chondroitin) .Marland Kitchen.. 1po two times a day  Other Orders: Tdap => 74yrs IM (66440) Admin 1st Vaccine  (34742)  Patient Instructions: 1)  Please take all new medications as prescribed 2)  Continue all previous medications as before this visit , as you need 3)  Please schedule a follow-up appointment in 1 year or sooner if needed Prescriptions: GLUCOSAMINE-CHONDROITIN 500-400 MG CAPS (GLUCOSAMINE-CHONDROITIN) 1po two times a day  #180 x 3   Entered and Authorized by:   Corwin Levins MD   Signed by:   Corwin Levins MD on 04/08/2010   Method used:   Print then Give to Patient   RxID:   [id number] FISH OIL 1000 MG CAPS (OMEGA-3 FATTY ACIDS) 2 by mouth once daily  #180 x 3   Entered and Authorized by:   Corwin Levins MD   Signed by:   Corwin Levins MD on 04/08/2010   Method used:   Print then Give to Patient   RxID:   [id number]    Immunizations Administered:  Tetanus Vaccine:    Vaccine Type: Tdap    Site: left deltoid    Mfr: GlaxoSmithKline  Dose: 0.5 ml    Route: IM    Given by: Robin Ewing    Exp. Date: 09/01/2010    Lot #: [id number]    VIS given: 09/24/07 version given April 08, 2010.

## 2010-12-08 NOTE — Assessment & Plan Note (Signed)
Summary: VOMIT'G-DIARRHEA-PASS OUT LAST WK/EMT-$50-STC   Vital Signs:  Patient profile:   41 year old male Height:      73.5 inches Weight:      263.13 pounds BMI:     34.37 O2 Sat:      97 % Temp:     98.0 degrees F oral Pulse rate:   70 / minute BP sitting:   110 / 70  (right arm) Cuff size:   large  Vitals Entered By: Windell Norfolk (February 23, 2009 4:24 PM) CC: pt passed out and had vomiting and diarrhea which have subsided now   CC:  pt passed out and had vomiting and diarrhea which have subsided now.  History of Present Illness: here after an episode of syncope after nausea wave came over him standing smoking 4 days ago, without CP, sob, doe, wheezing, orthopnea, pnd or LE edema, dizziness either prior or since.  Has also for some reason had 24 hours low grade temp, n/v and watery diarrhea that seemed to resolve just prior to coming in for OV today; also has increased reflux symtpoms recently without dysphagia, wt loss.  Problems Prior to Update: 1)  Gastroenteritis, Acute  (ICD-558.9) 2)  Syncope  (ICD-780.2) 3)  Preventive Health Care  (ICD-V70.0) 4)  Chest Pain  (ICD-786.50) 5)  Snoring  (ICD-786.09) 6)  Allergic Rhinitis  (ICD-477.9) 7)  Gerd  (ICD-530.81) 8)  Obesity  (ICD-278.00) 9)  Hypertension  (ICD-401.9) 10)  Hyperlipidemia  (ICD-272.4)  Medications Prior to Update: 1)  Adult Aspirin Ec Low Strength 81 Mg Tbec (Aspirin) .Marland Kitchen.. 1 Po Once Daily 2)  Benicar 40 Mg Tabs (Olmesartan Medoxomil) .Marland Kitchen.. 1 By Mouth Once Daily  Current Medications (verified): 1)  Adult Aspirin Ec Low Strength 81 Mg Tbec (Aspirin) .Marland Kitchen.. 1 Po Once Daily 2)  Benicar 40 Mg Tabs (Olmesartan Medoxomil) .Marland Kitchen.. 1 By Mouth Once Daily 3)  Lansoprazole 30 Mg Cpdr (Lansoprazole) .Marland Kitchen.. 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)  Risk Factors:    Alcohol Use: N/A    >5 drinks/d w/in last 3 months: N/A    Caffeine Use: N/A    Diet: N/A    Exercise: N/A  Risk Factors:    Smoking Status: quit (01/09/2008)    Packs/Day: N/A    Cigars/wk: N/A    Pipe Use/wk: N/A    Cans of tobacco/wk: N/A    Passive Smoke Exposure: N/A  Family History:    Reviewed history from 01/09/2008 and no changes required:       stroke       DM       heart disease  Social History:    Reviewed history from 01/09/2008 and no changes required:       Married       Former Smoker       Alcohol use-yes       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         all otherwise negative   Physical Exam  General:  alert and overweight-appearing.   Head:  normocephalic and atraumatic.   Eyes:  vision grossly intact, pupils equal, and pupils round.   Ears:  R ear normal and L ear normal.   Nose:  no external deformity and no nasal discharge.   Mouth:  pharyngeal erythema and fair dentition.   Neck:  supple and no masses.   Lungs:  normal respiratory effort and normal breath sounds.   Heart:  normal rate, regular rhythm, and no murmur.   Abdomen:  soft and normal bowel sounds.  with mild epigastric tender only, no guarding or rebound Msk:  no  joint tenderness and no joint swelling.   Extremities:  no edema, no ulcers  Neurologic:  cranial nerves II-XII intact and strength normal in all extremities.     Impression & Recommendations:  Problem # 1:  Preventive Health Care (ICD-V70.0)  Overall doing well, up to date, counseled on routine health concerns for screening and prevention, immunizations up to date or declined, labs reviewed, ecg reviewed , for labs later this week (not fasting today)  Orders: EKG w/ Interpretation (93000)  Problem # 2:  SYNCOPE (ICD-780.2) c/w vasovagal, ok to follow  Problem # 3:  GASTROENTERITIS, ACUTE (ICD-558.9) prob viral vs food poisoning  - ok to follow, seems near resolved  Problem # 4:  GERD (ICD-530.81)  treat as above, f/u any worsening signs or symptoms  -   His updated medication list for this problem includes:    Lansoprazole 30 Mg Cpdr (Lansoprazole) .Marland Kitchen... 1 by mouth once daily treat as above, f/u any worsening signs or symptoms   Complete Medication List: 1)  Adult Aspirin Ec Low Strength 81 Mg Tbec (Aspirin) .Marland Kitchen.. 1 po once daily 2)  Benicar 40 Mg Tabs (Olmesartan medoxomil) .Marland Kitchen.. 1 by mouth once daily 3)  Lansoprazole 30 Mg Cpdr (Lansoprazole) .Marland Kitchen.. 1 by mouth once daily  Patient Instructions: 1)  Please take all new medications as prescribed - the generic for prevacid 2)  Continue all medications that you may have been taking previously  3)  please return on Thursday AM for lab work (we will have it arranged in the computer to be done that day) 4)  Please schedule a follow-up appointment in 1 year or sooner if needed 5)  you are given the note for work today Prescriptions: LANSOPRAZOLE 30 MG CPDR (LANSOPRAZOLE) 1 by mouth once daily  #30 x 11   Entered and Authorized by:   Corwin Levins MD   Signed by:   Corwin Levins MD on 02/23/2009   Method used:   Print then Give to Patient   RxID:   [id number]

## 2010-12-08 NOTE — Letter (Signed)
Summary: Out of Work  [facility]  [address] [place], [place] [address]   Phone: [phone]  Fax: [phone]    February 23, 2009   Employee:  [name]    To Whom It May Concern:   For Medical reasons, please excuse the above named employee from work for the following dates:  Start:   February 22, 2009  End:   February 23, 1009   ---  to return to work without restriction February 24, 2009  If you need additional information, please feel free to contact our office.         Sincerely,    Corwin Levins MD

## 2010-12-08 NOTE — Progress Notes (Signed)
  Phone Note Call from Patient   Caller: wife: Luke Hunt Summary of Call: Called stating her husband is complaining of left sided chest pain and fatigue that started today.  She reports her husband has h/o HTN and hyperlipidemia. He took an aspirin today.  I advised she call 911 and have him transfer to the ED. She agreed. 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 Initial call taken by: Leanne Chang MD,  January 11, 2008

## 2010-12-08 NOTE — Consult Note (Signed)
Summary: OSA symptoms/Chris Newman,MD  OSA symptoms/Chris Newman,MD   Imported By: Lester [place] 12/10/2008 10:10:32  _____________________________________________________________________  External Attachment:    Type:   Image     Comment:   External Document

## 2010-12-08 NOTE — Progress Notes (Signed)
  Phone Note Outgoing Call   Summary of Call: LMOPT - labs negative, normal, or stable  - No Acute problem  - stress test normal Initial call taken by: Corwin Levins MD,  December 01, 2008 11:52 AM

## 2010-12-08 NOTE — Miscellaneous (Signed)
Summary: Orders Update  Clinical Lists Changes  Orders: Added new Service order of Est. Patient 18-39 years ([address]) - Signed

## 2010-12-22 ENCOUNTER — Telehealth: Payer: Self-pay | Admitting: Internal Medicine

## 2010-12-28 NOTE — Progress Notes (Signed)
Summary: Rx req  Phone Note Call from Patient Call back at Work Phone [phone]   Caller: Patient Summary of Call: Pt called requesting Rx for Viagra [facility] [address] Initial call taken by: Margaret Pyle, CMA,  December 22, 2010 3:46 PM    New/Updated Medications: VIAGRA 100 MG TABS (SILDENAFIL CITRATE) 1po every other day as needed Prescriptions: VIAGRA 100 MG TABS (SILDENAFIL CITRATE) 1po every other day as needed  #5 x 11   Entered and Authorized by:   Corwin Levins MD   Signed by:   Corwin Levins MD on 12/22/2010   Method used:   [facility]ronically [facility]        [facility] [facility] [address] [phone]* (retail)       [address]       [place], [place]  [id number]       Ph: [phone] or [phone]       Fax: [phone]   RxID:   [id number]  done per emr Corwin Levins MD  December 22, 2010 3:58 PM

## 2011-05-16 ENCOUNTER — Encounter: Payer: Self-pay | Admitting: Internal Medicine

## 2011-05-17 ENCOUNTER — Other Ambulatory Visit: Payer: Self-pay | Admitting: Internal Medicine

## 2011-05-17 ENCOUNTER — Other Ambulatory Visit (INDEPENDENT_AMBULATORY_CARE_PROVIDER_SITE_OTHER): Payer: Self-pay

## 2011-05-17 ENCOUNTER — Other Ambulatory Visit (INDEPENDENT_AMBULATORY_CARE_PROVIDER_SITE_OTHER): Payer: Self-pay | Admitting: Internal Medicine

## 2011-05-17 DIAGNOSIS — Z0000 Encounter for general adult medical examination without abnormal findings: Secondary | ICD-10-CM

## 2011-05-17 LAB — CBC WITH DIFFERENTIAL/PLATELET
Basophils Absolute: 0 10*3/uL (ref 0.0–0.1)
Basophils Relative: 0.6 % (ref 0.0–3.0)
Eosinophils Absolute: 0.1 10*3/uL (ref 0.0–0.7)
Eosinophils Relative: 0.8 % (ref 0.0–5.0)
HCT: 41.9 % (ref 39.0–52.0)
Hemoglobin: 14.5 g/dL (ref 13.0–17.0)
Lymphocytes Relative: 23.3 % (ref 12.0–46.0)
Lymphs Abs: 1.7 10*3/uL (ref 0.7–4.0)
MCHC: 34.7 g/dL (ref 30.0–36.0)
MCV: 87.2 fl (ref 78.0–100.0)
Monocytes Absolute: 0.5 10*3/uL (ref 0.1–1.0)
Monocytes Relative: 6.6 % (ref 3.0–12.0)
Neutro Abs: 5.1 10*3/uL (ref 1.4–7.7)
Neutrophils Relative %: 68.7 % (ref 43.0–77.0)
Platelets: 206 10*3/uL (ref 150.0–400.0)
RBC: 4.81 Mil/uL (ref 4.22–5.81)
RDW: 13.6 % (ref 11.5–14.6)
WBC: 7.4 10*3/uL (ref 4.5–10.5)

## 2011-05-17 LAB — URINALYSIS, ROUTINE W REFLEX MICROSCOPIC
Bilirubin Urine: NEGATIVE
Ketones, ur: NEGATIVE
Leukocytes, UA: NEGATIVE
Nitrite: NEGATIVE
Specific Gravity, Urine: >=1.03 (ref 1.000–1.030)
Total Protein, Urine: NEGATIVE
Urine Glucose: NEGATIVE
Urobilinogen, UA: 0.2 (ref 0.0–1.0)
pH: 5.5 (ref 5.0–8.0)

## 2011-05-17 LAB — BASIC METABOLIC PANEL
BUN: 23 mg/dL (ref 6–23)
CO2: 29 mEq/L (ref 19–32)
Calcium: 9.6 mg/dL (ref 8.4–10.5)
Chloride: 107 mEq/L (ref 96–112)
Creatinine, Ser: 1.2 mg/dL (ref 0.4–1.5)
GFR: 70.16 mL/min (ref >60.00)
Glucose, Bld: 92 mg/dL (ref 70–99)
Potassium: 4.6 mEq/L (ref 3.5–5.1)
Sodium: 140 mEq/L (ref 135–145)

## 2011-05-17 LAB — URINALYSIS
Bilirubin Urine: NEGATIVE
Ketones, ur: NEGATIVE
Leukocytes, UA: NEGATIVE
Nitrite: NEGATIVE
Specific Gravity, Urine: >=1.03 (ref 1.000–1.030)
Total Protein, Urine: NEGATIVE
Urine Glucose: NEGATIVE
Urobilinogen, UA: 0.2 (ref 0.0–1.0)
pH: 5.5 (ref 5.0–8.0)

## 2011-05-17 LAB — HEPATIC FUNCTION PANEL
ALT: 28 U/L (ref 0–53)
AST: 26 U/L (ref 0–37)
Albumin: 4.6 g/dL (ref 3.5–5.2)
Alkaline Phosphatase: 52 U/L (ref 39–117)
Bilirubin, Direct: 0.1 mg/dL (ref 0.0–0.3)
Total Bilirubin: 0.8 mg/dL (ref 0.3–1.2)
Total Protein: 7.5 g/dL (ref 6.0–8.3)

## 2011-05-17 LAB — TSH: TSH: 3.12 u[IU]/mL (ref 0.35–5.50)

## 2011-05-17 LAB — LIPID PANEL
Cholesterol: 187 mg/dL (ref 0–200)
HDL: 45 mg/dL (ref >39.00)
LDL Cholesterol: 124 mg/dL — ABNORMAL HIGH (ref 0–99)
Total CHOL/HDL Ratio: 4
Triglycerides: 89 mg/dL (ref 0.0–149.0)
VLDL: 17.8 mg/dL (ref 0.0–40.0)

## 2011-05-22 ENCOUNTER — Encounter: Payer: Self-pay | Admitting: Internal Medicine

## 2011-05-22 DIAGNOSIS — Z0000 Encounter for general adult medical examination without abnormal findings: Secondary | ICD-10-CM | POA: Insufficient documentation

## 2011-05-23 ENCOUNTER — Encounter: Payer: Self-pay | Admitting: Internal Medicine

## 2011-05-23 ENCOUNTER — Ambulatory Visit (INDEPENDENT_AMBULATORY_CARE_PROVIDER_SITE_OTHER): Payer: BC Managed Care – PPO | Admitting: Internal Medicine

## 2011-05-23 VITALS — BP 120/82 | HR 75 | Temp 98.0°F | Ht 73.0 in | Wt 252.5 lb

## 2011-05-23 DIAGNOSIS — Z0000 Encounter for general adult medical examination without abnormal findings: Secondary | ICD-10-CM

## 2011-05-23 NOTE — Assessment & Plan Note (Signed)

## 2011-05-23 NOTE — Progress Notes (Signed)
Subjective:    Patient ID: Luke Hunt, male    DOB: [birth year], 40 y.o.   MRN: [id number]  HPI  Here for wellness and f/u;  Overall doing ok;  Pt denies CP, worsening SOB, DOE, wheezing, orthopnea, PND, worsening LE edema, palpitations, dizziness or syncope.  Pt denies neurological change such as new Headache, facial or extremity weakness.  Pt denies polydipsia, polyuria, or low sugar symptoms. Pt states overall good compliance with treatment and medications, good tolerability, and trying to follow lower cholesterol diet.  Pt denies worsening depressive symptoms, suicidal ideation or panic. No fever,  night sweats, loss of appetite, or other constitutional symptoms.  Pt states good ability with ADL's, low fall risk, home safety reviewed and adequate, no significant changes in hearing or vision, and has been more active with exercise, lost 21 lbs since last yr. Still uses CPAP nightly with good results. Past Medical History  Diagnosis Date  . ALLERGIC RHINITIS 01/09/2008  . BACK PAIN 10/13/2009  . CHEST PAIN 11/20/2008  . GASTROENTERITIS, ACUTE 02/23/2009  . GERD 07/05/2007  . HYPERLIPIDEMIA 07/04/2007  . HYPERTENSION 07/04/2007  . KNEE PAIN, RIGHT 10/13/2009  . OBESITY 07/05/2007  . SNORING 11/20/2008  . SYNCOPE 02/23/2009   Past Surgical History  Procedure Date  . Appendectomy   . Rigth knee arthroscopy     @ [place] [facility]    reports that he has quit smoking. He does not have any smokeless tobacco history on file. He reports that he drinks alcohol. His drug history not on file. family history includes Diabetes in his other; Heart disease in his other; and Stroke in his other. No Known Allergies No current outpatient prescriptions on file prior to visit.   Review of Systems Review of Systems  Constitutional: Negative for diaphoresis, activity change, appetite change and unexpected weight change.  HENT: Negative for hearing loss, ear pain, facial swelling, mouth sores and neck stiffness.   Eyes:  Negative for pain, redness and visual disturbance.  Respiratory: Negative for shortness of breath and wheezing.   Cardiovascular: Negative for chest pain and palpitations.  Gastrointestinal: Negative for diarrhea, blood in stool, abdominal distention and rectal pain.  Genitourinary: Negative for hematuria, flank pain and decreased urine volume.  Musculoskeletal: Negative for myalgias and joint swelling.  Skin: Negative for color change and wound.  Neurological: Negative for syncope and numbness.  Hematological: Negative for adenopathy.  Psychiatric/Behavioral: Negative for hallucinations, self-injury, decreased concentration and agitation.      Objective:   Physical Exam BP 120/82  Pulse 75  Temp(Src) 98 F (36.7 C) (Oral)  Ht 6\' 1"  (1.854 m)  Wt 252 lb 8 oz (114.533 kg)  BMI 33.31 kg/m2  SpO2 98% Physical Exam  VS noted Constitutional: Pt is oriented to person, place, and time. Appears well-developed and well-nourished.  HENT:  Head: Normocephalic and atraumatic.  Right Ear: External ear normal.  Left Ear: External ear normal.  Nose: Nose normal.  Mouth/Throat: Oropharynx is clear and moist.  Eyes: Conjunctivae and EOM are normal. Pupils are equal, round, and reactive to light.  Neck: Normal range of motion. Neck supple. No JVD present. No tracheal deviation present.  Cardiovascular: Normal rate, regular rhythm, normal heart sounds and intact distal pulses.   Pulmonary/Chest: Effort normal and breath sounds normal.  Abdominal: Soft. Bowel sounds are normal. There is no tenderness.  Musculoskeletal: Normal range of motion. Exhibits no edema.  Lymphadenopathy:  Has no cervical adenopathy.  Neurological: Pt is alert and oriented to person,  place, and time. Pt has normal reflexes. No cranial nerve deficit.  Skin: Skin is warm and dry. No rash noted.  Psychiatric:  Has  normal mood and affect. Behavior is normal. 1+ nervous        Assessment & Plan:

## 2011-05-23 NOTE — Patient Instructions (Signed)
Please continue your efforts at wt loss and diet No new medications today Please return in 1 year for your yearly visit, or sooner if needed, with Lab testing done 3-5 days before

## 2011-06-02 ENCOUNTER — Telehealth: Payer: Self-pay

## 2011-06-02 NOTE — Telephone Encounter (Signed)
Left message on machine for pt to return my call  

## 2011-06-02 NOTE — Telephone Encounter (Signed)
Pt called requesting referral to Dr Kinnie Scales for colonoscopy. Pt specifically is requesting this MD and does not want appt with [facility]

## 2011-06-02 NOTE — Telephone Encounter (Signed)
Colonoscopy not needed for routine screening until age 40

## 2011-06-05 NOTE — Telephone Encounter (Signed)
Left message on machine for pt to return my call  

## 2011-06-06 NOTE — Telephone Encounter (Signed)
Left message on machine for pt to return my call, closing phone note until further contact from pt 

## 2011-07-03 ENCOUNTER — Telehealth: Payer: Self-pay

## 2011-07-03 NOTE — Telephone Encounter (Signed)
Pt called requesting referral to Urologist for discomfort in the testes.

## 2011-07-03 NOTE — Telephone Encounter (Signed)
I think we could probably handle that here,  Ok for OV if he is ok with that  O/w let me know

## 2011-07-04 NOTE — Telephone Encounter (Signed)
Pt advised and agreed, stated he will call back to schedule appt at a more convenient time.

## 2011-07-31 LAB — POCT RAPID STREP A: Streptococcus, Group A Screen (Direct): POSITIVE — AB

## 2011-08-01 ENCOUNTER — Telehealth: Payer: Self-pay

## 2011-08-01 NOTE — Telephone Encounter (Signed)
The patient called and needs a referral to [facility] to schedule a fitting for a new CPAP mask. The patient is currently using a full mask and it is not working.The patient would like.  to be fitted for a mask with nose inserts. Insurance required PCP to send referral to [facility] to order, fax [phone].

## 2011-08-01 NOTE — Telephone Encounter (Signed)
rx hardcopy for this  - to [facility]

## 2011-08-02 NOTE — Telephone Encounter (Signed)
Faxed order for fitting to [facility] and put rx up front for patient to pickup as well. Called the patient informed of information.

## 2011-08-29 ENCOUNTER — Telehealth: Payer: Self-pay

## 2011-08-29 MED ORDER — LOSARTAN POTASSIUM 50 MG PO TABS: 50.0000 mg | ORAL_TABLET | Freq: Every day | ORAL | Status: DC

## 2011-08-29 NOTE — Telephone Encounter (Signed)
Pt called stating that for the last 2 months his BP has been elevated - 160-85. Pt is requesting to restart Losartan 50 he was previously taking. Please advise.

## 2011-08-29 NOTE — Telephone Encounter (Signed)
Called the patient left message of MD's instructions. Sent prescription to pharmacy.

## 2011-08-29 NOTE — Telephone Encounter (Signed)
Ok to re-start- to [facility] [employer]

## 2011-10-09 ENCOUNTER — Telehealth: Payer: Self-pay

## 2011-10-09 NOTE — Telephone Encounter (Signed)
Pt is requesting referral to GI for persistent constipation. Pt was advised to OV with [facility] first but he declined at this time and would prefer GI consult.

## 2011-10-09 NOTE — Telephone Encounter (Signed)
Ok - done per emr 

## 2011-10-13 ENCOUNTER — Ambulatory Visit (INDEPENDENT_AMBULATORY_CARE_PROVIDER_SITE_OTHER): Payer: BC Managed Care – PPO | Admitting: Internal Medicine

## 2011-10-13 ENCOUNTER — Encounter: Payer: Self-pay | Admitting: Internal Medicine

## 2011-10-13 VITALS — BP 130/88 | HR 72 | Temp 97.9°F

## 2011-10-13 DIAGNOSIS — J069 Acute upper respiratory infection, unspecified: Secondary | ICD-10-CM

## 2011-10-13 DIAGNOSIS — H6592 Unspecified nonsuppurative otitis media, left ear: Secondary | ICD-10-CM

## 2011-10-13 DIAGNOSIS — H6590 Unspecified nonsuppurative otitis media, unspecified ear: Secondary | ICD-10-CM

## 2011-10-13 MED ORDER — PROMETHAZINE-CODEINE 6.25-10 MG/5ML PO SYRP: 5.0000 mL | ORAL_SOLUTION | ORAL | Status: AC | PRN

## 2011-10-13 MED ORDER — AMOXICILLIN-POT CLAVULANATE 875-125 MG PO TABS: 1.0000 | ORAL_TABLET | Freq: Two times a day (BID) | ORAL | Status: AC

## 2011-10-13 MED ORDER — PHENYLEPH-PROMETHAZINE-COD 5-6.25-10 MG/5ML PO SYRP: 5.0000 mL | ORAL_SOLUTION | Freq: Four times a day (QID) | ORAL | Status: DC | PRN

## 2011-10-13 NOTE — Patient Instructions (Signed)
It was good to see you today. Augmentin antibiotics twice daily for the next 10 days - also codeine prescription cough syrup as discussed Your prescription(s) have been submitted to your pharmacy. Please take as directed and contact our office if you believe you are having problem(s) with the medication(s). Use Tylenol or DayQuil as discussed, aspirin okay for nasal congestion if needed Keep hydrated, rest and use warm salt water gargle as needed for throat symptoms

## 2011-10-13 NOTE — Progress Notes (Signed)
  Subjective:    HPI  complains of cold symptoms  Onset >1 week ago, wax/wane symptoms  associated with rhinorrhea, sneezing, sore throat, mild headache and low grade fever Also L ear pain, myalgias, sinus pressure and mild-mod head congestion No relief with OTC meds Precipitated by sick contacts  Past Medical History  Diagnosis Date  . ALLERGIC RHINITIS   . GERD   . HYPERLIPIDEMIA   . HYPERTENSION   . OBESITY     Review of Systems Constitutional: No fever or night sweats, no unexpected weight change Pulmonary: No pleurisy or hemoptysis Cardiovascular: No chest pain or palpitations     Objective:   Physical Exam BP 130/88  Pulse 72  Temp(Src) 97.9 F (36.6 C) (Oral)  SpO2 98% GEN: mildly ill appearing and audible head/chest congestion HENT: NCAT, mild sinus tenderness bilaterally, nares with clear discharge, oropharynx mild erythema, no exudate; left red TM with effusion Eyes: Vision grossly intact, no conjunctivitis Lungs: Clear to auscultation but few scattered rhonchi; no wheeze, no increased work of breathing Cardiovascular: Regular rate and rhythm, no bilateral edema      Assessment & Plan:  Viral URI  L OM Cough, postnasal drip related to above    Empiric antibiotics prescribed due to ear symptoms symptom Prescription cough suppression - new prescriptions done Symptomatic care with Tylenol or Advil, hydration and rest -  salt gargle advised as needed

## 2011-11-15 ENCOUNTER — Encounter: Payer: Self-pay | Admitting: Internal Medicine

## 2011-11-15 ENCOUNTER — Ambulatory Visit (INDEPENDENT_AMBULATORY_CARE_PROVIDER_SITE_OTHER): Payer: BC Managed Care – PPO | Admitting: Internal Medicine

## 2011-11-15 DIAGNOSIS — I10 Essential (primary) hypertension: Secondary | ICD-10-CM

## 2011-11-15 DIAGNOSIS — H6690 Otitis media, unspecified, unspecified ear: Secondary | ICD-10-CM

## 2011-11-15 DIAGNOSIS — Z0000 Encounter for general adult medical examination without abnormal findings: Secondary | ICD-10-CM

## 2011-11-15 DIAGNOSIS — H6692 Otitis media, unspecified, left ear: Secondary | ICD-10-CM | POA: Insufficient documentation

## 2011-11-15 DIAGNOSIS — J309 Allergic rhinitis, unspecified: Secondary | ICD-10-CM

## 2011-11-15 MED ORDER — LEVOFLOXACIN 250 MG PO TABS: 250.0000 mg | ORAL_TABLET | Freq: Every day | ORAL | Status: AC

## 2011-11-15 NOTE — Assessment & Plan Note (Signed)
Ok for otc allegra prn,  to f/u any worsening symptoms or concerns 

## 2011-11-15 NOTE — Assessment & Plan Note (Signed)
stable overall by hx and exam, most recent data reviewed with pt, and pt to continue medical treatment as before  BP Readings from Last 3 Encounters:  11/15/11 132/82  10/13/11 130/88  05/23/11 120/82

## 2011-11-15 NOTE — Patient Instructions (Signed)
Take all new medications as prescribed Continue all other medications as before You can also take Delsym OTC for cough, and/or Mucinex (or it's generic off brand) for congestion or ear pressure

## 2011-11-15 NOTE — Progress Notes (Signed)
  Subjective:    Patient ID: Luke Hunt, male    DOB: August 19, 1971, 41 y.o.   MRN: [id number]  HPI  Here with acute onset 3-4 days worsening left ear pain, fever, ST, HA, general weakness and malaise, rare nonprod cough, and Pt denies chest pain, increased sob or doe, wheezing, orthopnea, PND, increased LE swelling, palpitations, dizziness or syncope.  Pt denies new neurological symptoms such as new headache, or facial or extremity weakness or numbness   Pt denies polydipsia, polyuria.  Does have several wks ongoing nasal allergy symptoms with clear congestion, itch and sneeze, without fever, pain, ST, cough or wheezing. Past Medical History  Diagnosis Date  . ALLERGIC RHINITIS   . GERD   . HYPERLIPIDEMIA   . HYPERTENSION   . OBESITY    Past Surgical History  Procedure Date  . Appendectomy   . Rigth knee arthroscopy     @ [place] [facility]    reports that he has quit smoking. He does not have any smokeless tobacco history on file. He reports that he drinks alcohol. His drug history not on file. family history includes Diabetes in his other; Heart disease in his other; and Stroke in his other. No Known Allergies Current Outpatient Prescriptions on File Prior to Visit  Medication Sig Dispense Refill  . losartan (COZAAR) 50 MG tablet Take 1 tablet (50 mg total) by mouth daily.  30 tablet  11   Review of Systems Review of Systems  Constitutional: Negative for diaphoresis and unexpected weight change.  HENT: Negative for drooling and tinnitus.   Eyes: Negative for photophobia and visual disturbance.  Respiratory: Negative for choking and stridor.   Gastrointestinal: Negative for vomiting and blood in stool.  Genitourinary: Negative for hematuria and decreased urine volume.     Objective:   Physical Exam BP 132/82  Pulse 58  Temp(Src) 97.8 F (36.6 C) (Oral)  Ht 6\' 2"  (1.88 m)  Wt 254 lb 8 oz (115.44 kg)  BMI 32.68 kg/m2  SpO2 98% Physical Exam  VS noted,mild ill  appaering Constitutional: Pt appears well-developed and well-nourished.  HENT: Head: Normocephalic.  Right Ear: External ear normal.  Left Ear: External ear normal.  Right tm's mild erythema, left TM severe erythema, mild bulging.  Sinus nontender.  Pharynx mild erythema Eyes: Conjunctivae and EOM are normal. Pupils are equal, round, and reactive to light.  Neck: Normal range of motion. Neck supple.  Cardiovascular: Normal rate and regular rhythm.   Pulmonary/Chest: Effort normal and breath sounds normal.  Neurological: Pt is alert. No cranial nerve deficit.  Skin: Skin is warm. No erythema.  Psychiatric: Pt behavior is normal. Thought content normal.     Assessment & Plan:

## 2011-11-15 NOTE — Assessment & Plan Note (Signed)
Mod to severe, for antibx course,  to f/u any worsening symptoms or concerns,mucinex prn

## 2012-02-09 ENCOUNTER — Encounter: Payer: Self-pay | Admitting: Internal Medicine

## 2012-02-09 ENCOUNTER — Ambulatory Visit (INDEPENDENT_AMBULATORY_CARE_PROVIDER_SITE_OTHER): Payer: BC Managed Care – PPO | Admitting: Internal Medicine

## 2012-02-09 VITALS — BP 120/70 | HR 54 | Temp 97.7°F | Ht 73.0 in | Wt 258.0 lb

## 2012-02-09 DIAGNOSIS — J019 Acute sinusitis, unspecified: Secondary | ICD-10-CM

## 2012-02-09 DIAGNOSIS — J309 Allergic rhinitis, unspecified: Secondary | ICD-10-CM

## 2012-02-09 DIAGNOSIS — I10 Essential (primary) hypertension: Secondary | ICD-10-CM

## 2012-02-09 MED ORDER — LEVOFLOXACIN 250 MG PO TABS: 250.0000 mg | ORAL_TABLET | Freq: Every day | ORAL | Status: AC

## 2012-02-09 NOTE — Assessment & Plan Note (Signed)
Mild to mod, for antibx course,  to f/u any worsening symptoms or concerns 

## 2012-02-09 NOTE — Assessment & Plan Note (Signed)
Ok for otc allegra prn,  to f/u any worsening symptoms or concerns 

## 2012-02-09 NOTE — Assessment & Plan Note (Signed)
stable overall by hx and exam, most recent data reviewed with pt, and pt to continue medical treatment as before BP Readings from Last 3 Encounters:  02/09/12 120/70  11/15/11 132/82  10/13/11 130/88

## 2012-02-09 NOTE — Progress Notes (Signed)
  Subjective:    Patient ID: Luke Hunt, male    DOB: 09-21-1971, 41 y.o.   MRN: [id number]  HPI   Here with 2 days acute onset fever, facial pain, pressure, general weakness and malaise, and greenish d/c, with slight ST, but little to no cough and Pt denies chest pain, increased sob or doe, wheezing, orthopnea, PND, increased LE swelling, palpitations, dizziness or syncope.  Pt denies new neurological symptoms such as new headache, or facial or extremity weakness or numbness.  Pt denies polydipsia, polyuria.  Does have several wks ongoing nasal allergy symptoms with clear congestion, itch and sneeze, without fever, pain, ST, cough or wheezing.  BP at home has been < 140/90 consistently, tolerating med well. Past Medical History  Diagnosis Date  . ALLERGIC RHINITIS   . GERD   . HYPERLIPIDEMIA   . HYPERTENSION   . OBESITY    Past Surgical History  Procedure Date  . Appendectomy   . Rigth knee arthroscopy     @ [place] [facility]    reports that he has quit smoking. He does not have any smokeless tobacco history on file. He reports that he drinks alcohol. His drug history not on file. family history includes Diabetes in his other; Heart disease in his other; and Stroke in his other. No Known Allergies Current Outpatient Prescriptions on File Prior to Visit  Medication Sig Dispense Refill  . losartan (COZAAR) 50 MG tablet Take 1 tablet (50 mg total) by mouth daily.  30 tablet  11   Review of Systems Review of Systems  Constitutional: Negative for diaphoresis and unexpected weight change.  HENT: Negative for drooling and tinnitus.   Eyes: Negative for photophobia and visual disturbance.  Respiratory: Negative for choking and stridor.   Gastrointestinal: Negative for vomiting and blood in stool.  Genitourinary: Negative for hematuria and decreased urine volume.  Neurological: Negative for tremors and numbness.  Psychiatric/Behavioral: Negative for decreased concentration. The patient is not  hyperactive.       Objective:   Physical Exam BP 120/70  Pulse 54  Temp(Src) 97.7 F (36.5 C) (Oral)  Ht 6\' 1"  (1.854 m)  Wt 258 lb (117.028 kg)  BMI 34.04 kg/m2  SpO2 97% Physical Exam  VS noted, mild ill Constitutional: Pt appears well-developed and well-nourished.  HENT: Head: Normocephalic.  Right Ear: External ear normal.  Left Ear: External ear normal.  Bilat tm's mild erythema.  Sinus tender.  Pharynx mild erythema Eyes: Conjunctivae and EOM are normal. Pupils are equal, round, and reactive to light.  Neck: Normal range of motion. Neck supple.  Cardiovascular: Normal rate and regular rhythm.   Pulmonary/Chest: Effort normal and breath sounds normal.  Skin: Skin is warm. No erythema.  Psychiatric: Pt behavior is normal. Thought content normal.        Assessment & Plan:

## 2012-02-09 NOTE — Patient Instructions (Signed)
Take all new medications as prescribed Continue all other medications as before You can also take Delsym OTC for cough, and/or Mucinex (or it's generic off brand) for congestion Please have the pharmacy call with any refills you may need. Please return in 3-4 mo with Lab testing done 3-5 days before

## 2012-05-20 ENCOUNTER — Other Ambulatory Visit (INDEPENDENT_AMBULATORY_CARE_PROVIDER_SITE_OTHER): Payer: BC Managed Care – PPO

## 2012-05-20 DIAGNOSIS — Z0000 Encounter for general adult medical examination without abnormal findings: Secondary | ICD-10-CM

## 2012-05-20 LAB — CBC WITH DIFFERENTIAL/PLATELET
Basophils Absolute: 0.1 10*3/uL (ref 0.0–0.1)
Basophils Relative: 0.8 % (ref 0.0–3.0)
Eosinophils Absolute: 0.1 10*3/uL (ref 0.0–0.7)
Eosinophils Relative: 1.5 % (ref 0.0–5.0)
HCT: 40.1 % (ref 39.0–52.0)
Hemoglobin: 13.4 g/dL (ref 13.0–17.0)
Lymphocytes Relative: 29.1 % (ref 12.0–46.0)
Lymphs Abs: 1.9 10*3/uL (ref 0.7–4.0)
MCHC: 33.3 g/dL (ref 30.0–36.0)
MCV: 87.6 fl (ref 78.0–100.0)
Monocytes Absolute: 0.5 10*3/uL (ref 0.1–1.0)
Monocytes Relative: 8 % (ref 3.0–12.0)
Neutro Abs: 3.9 10*3/uL (ref 1.4–7.7)
Neutrophils Relative %: 60.6 % (ref 43.0–77.0)
Platelets: 211 10*3/uL (ref 150.0–400.0)
RBC: 4.57 Mil/uL (ref 4.22–5.81)
RDW: 13.1 % (ref 11.5–14.6)
WBC: 6.4 10*3/uL (ref 4.5–10.5)

## 2012-05-20 LAB — LIPID PANEL
Cholesterol: 192 mg/dL (ref 0–200)
HDL: 45.8 mg/dL (ref >39.00)
LDL Cholesterol: 127 mg/dL — ABNORMAL HIGH (ref 0–99)
Total CHOL/HDL Ratio: 4
Triglycerides: 97 mg/dL (ref 0.0–149.0)
VLDL: 19.4 mg/dL (ref 0.0–40.0)

## 2012-05-20 LAB — HEPATIC FUNCTION PANEL
ALT: 28 U/L (ref 0–53)
AST: 19 U/L (ref 0–37)
Albumin: 3.8 g/dL (ref 3.5–5.2)
Alkaline Phosphatase: 47 U/L (ref 39–117)
Bilirubin, Direct: 0.1 mg/dL (ref 0.0–0.3)
Total Bilirubin: 0.6 mg/dL (ref 0.3–1.2)
Total Protein: 6.6 g/dL (ref 6.0–8.3)

## 2012-05-20 LAB — URINALYSIS, ROUTINE W REFLEX MICROSCOPIC
Bilirubin Urine: NEGATIVE
Ketones, ur: NEGATIVE
Leukocytes, UA: NEGATIVE
Nitrite: NEGATIVE
Specific Gravity, Urine: >=1.03 (ref 1.000–1.030)
Total Protein, Urine: NEGATIVE
Urine Glucose: NEGATIVE
Urobilinogen, UA: 0.2 (ref 0.0–1.0)
pH: 5.5 (ref 5.0–8.0)

## 2012-05-20 LAB — TSH: TSH: 2.34 u[IU]/mL (ref 0.35–5.50)

## 2012-05-20 LAB — BASIC METABOLIC PANEL
BUN: 21 mg/dL (ref 6–23)
CO2: 31 mEq/L (ref 19–32)
Calcium: 9.3 mg/dL (ref 8.4–10.5)
Chloride: 104 mEq/L (ref 96–112)
Creatinine, Ser: 1.2 mg/dL (ref 0.4–1.5)
GFR: 70.46 mL/min (ref >60.00)
Glucose, Bld: 116 mg/dL — ABNORMAL HIGH (ref 70–99)
Potassium: 4.3 mEq/L (ref 3.5–5.1)
Sodium: 141 mEq/L (ref 135–145)

## 2012-05-20 LAB — PSA: PSA: 0.38 ng/mL (ref 0.10–4.00)

## 2012-05-20 LAB — HEMOGLOBIN A1C: Hgb A1c MFr Bld: 5.9 % (ref 4.6–6.5)

## 2012-05-23 ENCOUNTER — Encounter: Payer: Self-pay | Admitting: Internal Medicine

## 2012-05-23 ENCOUNTER — Ambulatory Visit (INDEPENDENT_AMBULATORY_CARE_PROVIDER_SITE_OTHER): Payer: BC Managed Care – PPO | Admitting: Internal Medicine

## 2012-05-23 DIAGNOSIS — Z0000 Encounter for general adult medical examination without abnormal findings: Secondary | ICD-10-CM

## 2012-05-23 DIAGNOSIS — I10 Essential (primary) hypertension: Secondary | ICD-10-CM

## 2012-05-23 DIAGNOSIS — G4733 Obstructive sleep apnea (adult) (pediatric): Secondary | ICD-10-CM

## 2012-05-23 DIAGNOSIS — E785 Hyperlipidemia, unspecified: Secondary | ICD-10-CM

## 2012-05-23 HISTORY — DX: Obstructive sleep apnea (adult) (pediatric): G47.33

## 2012-05-23 MED ORDER — LOSARTAN POTASSIUM 25 MG PO TABS: 25.0000 mg | ORAL_TABLET | Freq: Every day | ORAL | Status: DC

## 2012-05-23 MED ORDER — ASPIRIN 81 MG PO TBEC: 81.0000 mg | DELAYED_RELEASE_TABLET | Freq: Every day | ORAL | Status: AC

## 2012-05-23 NOTE — Progress Notes (Signed)
Subjective:    Patient ID: Luke Hunt, male    DOB: 21-Nov-1970, 41 y.o.   MRN: [id number]  HPI  Here for wellness and f/u;  Overall doing ok;  Pt denies CP, worsening SOB, DOE, wheezing, orthopnea, PND, worsening LE edema, palpitations, dizziness or syncope.  Pt denies neurological change such as new Headache, facial or extremity weakness.  Pt denies polydipsia, polyuria, or low sugar symptoms. Pt states overall good compliance with treatment and medications, good tolerability, and trying to follow lower cholesterol diet.  Pt denies worsening depressive symptoms, suicidal ideation or panic. No fever, wt loss, night sweats, loss of appetite, or other constitutional symptoms.  Pt states good ability with ADL's, low fall risk, home safety reviewed and adequate, no significant changes in hearing or vision, and occasionally active with exercise - really none at all.  Essentially no change in diet.  Despite gaining a few lbs, BP has been ok, and only taking the cozaar 50 mg qod.   Past Medical History  Diagnosis Date  . ALLERGIC RHINITIS   . GERD   . HYPERLIPIDEMIA   . HYPERTENSION   . OBESITY    Past Surgical History  Procedure Date  . Appendectomy   . Rigth knee arthroscopy     @ [place] [facility]    reports that he has quit smoking. He does not have any smokeless tobacco history on file. He reports that he drinks alcohol. His drug history not on file. family history includes Diabetes in his other; Heart disease in his other; and Stroke in his other. No Known Allergies Current Outpatient Prescriptions on File Prior to Visit  Medication Sig Dispense Refill  . losartan (COZAAR) 50 MG tablet Take 1 tablet (50 mg total) by mouth daily.  30 tablet  11   Review of Systems Review of Systems  Constitutional: Negative for diaphoresis, activity change, appetite change and unexpected weight change.  HENT: Negative for hearing loss, ear pain, facial swelling, mouth sores and neck stiffness.   Eyes: Negative  for pain, redness and visual disturbance.  Respiratory: Negative for shortness of breath and wheezing.   Cardiovascular: Negative for chest pain and palpitations.  Gastrointestinal: Negative for diarrhea, blood in stool, abdominal distention and rectal pain.  Genitourinary: Negative for hematuria, flank pain and decreased urine volume.  Musculoskeletal: Negative for myalgias and joint swelling.  Skin: Negative for color change and wound.  Neurological: Negative for syncope and numbness.  Hematological: Negative for adenopathy.  Psychiatric/Behavioral: Negative for hallucinations, self-injury, decreased concentration and agitation.      Objective:   Physical Exam There were no vitals taken for this visit. Physical Exam  VS noted Constitutional: Pt is oriented to person, place, and time. Appears well-developed and well-nourished.  HENT:  Head: Normocephalic and atraumatic.  Right Ear: External ear normal.  Left Ear: External ear normal.  Nose: Nose normal.  Mouth/Throat: Oropharynx is clear and moist.  Eyes: Conjunctivae and EOM are normal. Pupils are equal, round, and reactive to light.  Neck: Normal range of motion. Neck supple. No JVD present. No tracheal deviation present.  Cardiovascular: Normal rate, regular rhythm, normal heart sounds and intact distal pulses.   Pulmonary/Chest: Effort normal and breath sounds normal.  Abdominal: Soft. Bowel sounds are normal. There is no tenderness.  Musculoskeletal: Normal range of motion. Exhibits no edema.  Lymphadenopathy:  Has no cervical adenopathy.  Neurological: Pt is alert and oriented to person, place, and time. Pt has normal reflexes. No cranial nerve deficit.  Skin: Skin is warm and dry. No rash noted.  Psychiatric:  Has  normal mood and affect. Behavior is normal.      Assessment & Plan:

## 2012-05-23 NOTE — Patient Instructions (Addendum)
Decrease the losartan to 25 mg per day Start the Aspirin 81 mg - 1 per day - Enteric Coated only (only) Please continue your efforts at being more active, low cholesterol diet, and weight control. You are otherwise up to date with prevention Please return in 1 year for your yearly visit, or sooner if needed, with Lab testing done 3-5 days before

## 2012-05-23 NOTE — Assessment & Plan Note (Signed)
Ok to decrease the losartan to 25 mg per day,  to f/u any worsening symptoms or concerns

## 2012-05-23 NOTE — Assessment & Plan Note (Signed)

## 2012-05-23 NOTE — Assessment & Plan Note (Signed)
Stable, to start asa 81 mg per day, decliens statin, cont lower chol diet

## 2012-10-07 ENCOUNTER — Telehealth: Payer: Self-pay

## 2012-10-07 MED ORDER — LOSARTAN POTASSIUM 100 MG PO TABS: 100.0000 mg | ORAL_TABLET | Freq: Every day | ORAL | Status: DC

## 2012-10-07 NOTE — Telephone Encounter (Signed)
Called left message to call back 

## 2012-10-07 NOTE — Telephone Encounter (Signed)
Pt called stating that reduced dosage of Losartan is not controlling blood pressure. Pt reports average readings 150/80. Pt is requesting medication increase, please advise.

## 2012-10-07 NOTE — Telephone Encounter (Signed)
Please verify he is taking 25 mg losartan  If so, ok to increase to 100 mg qd - done erx

## 2012-10-08 NOTE — Telephone Encounter (Signed)
Patient did confirm he is taking the losartan 25 mg.  Informed to MD sent in Losartan 100 mg to [facility].  He agreed to start.

## 2012-10-14 ENCOUNTER — Ambulatory Visit (INDEPENDENT_AMBULATORY_CARE_PROVIDER_SITE_OTHER): Payer: BC Managed Care – PPO | Admitting: Internal Medicine

## 2012-10-14 ENCOUNTER — Encounter: Payer: Self-pay | Admitting: Internal Medicine

## 2012-10-14 VITALS — BP 110/76 | HR 52 | Temp 97.4°F | Ht 73.0 in | Wt 247.0 lb

## 2012-10-14 DIAGNOSIS — J309 Allergic rhinitis, unspecified: Secondary | ICD-10-CM

## 2012-10-14 NOTE — Progress Notes (Signed)
HPI  Pt presents to the clinic with 1 week history of post nasal drip, dry cough and ear pressure. He does have a history of allergies and feels like his allergies are flaring up. H e denies, fever, chills, facial pain or pressure or headache. He did have a slight productive cough this morning but it has resolved. It was productive of thick yellow sputum but has only occurred once. He has not taken anything OTC for relief. Nothing really makes it worse, it just wont go away.  Review of Systems    Past Medical History  Diagnosis Date  . ALLERGIC RHINITIS   . GERD   . HYPERLIPIDEMIA   . HYPERTENSION   . OBESITY   . OSA (obstructive sleep apnea) 05/23/2012    Family History  Problem Relation Age of Onset  . Stroke Other   . Diabetes Other   . Heart disease Other     History   Social History  . Marital Status: Married    Spouse Name: N/A    Number of Children: N/A  . Years of Education: N/A   Occupational History  . Not on file.   Social History Main Topics  . Smoking status: Former Games developer  . Smokeless tobacco: Not on file  . Alcohol Use: Yes  . Drug Use:   . Sexually Active:    Other Topics Concern  . Not on file   Social History Narrative  . No narrative on file    No Known Allergies   Constitutional: Denies headache, fatigue, fever or abrupt weight changes.  HEENT:  Positive ear pressure. Denies eye redness,eye pain, pressure behind the eyes, facial pain, nasal congestion and sore throat. ear pain, ringing in the ears, wax buildup, runny nose or bloody nose. Respiratory: Positive cough. Denies difficulty breathing or shortness of breath.  Cardiovascular: Denies chest pain, chest tightness, palpitations or swelling in the hands or feet.   No other specific complaints in a complete review of systems (except as listed in HPI above).  Objective:    General: Appears his stated age, well developed, well nourished in NAD. HEENT: Head: normal shape and size; Eyes:  sclera white, no icterus, conjunctiva pink, PERRLA and EOMs intact; Ears: Tm's gray and intact, normal light reflex; Nose: mucosa pink and moist, septum midline; Throat/Mouth: + PND. Teeth present, mucosa pink and moist, no exudate noted, no lesions or ulcerations noted.  Neck: No cervical lymphadenopathy. Neck supple, trachea midline. No massses, lumps or thyromegaly present.  Cardiovascular: Normal rate and rhythm. S1,S2 noted.  No murmur, rubs or gallops noted. No JVD or BLE edema. No carotid bruits noted. Pulmonary/Chest: Normal effort and positive vesicular breath sounds. No respiratory distress. No wheezes, rales or ronchi noted.      Assessment & Plan:   Allergic Rhinosinusitis  Can use a Neti Pot which can be purchased from your local drug store. Flonase 2 sprays each nostril for 3 days and then as needed. OTC Zyrtec  Let me know if not better in 2 days, will prescribe Azithromax 250 mg x 5 days  RTC as needed or if symptoms persist.

## 2012-10-14 NOTE — Patient Instructions (Signed)
Allergic Rhinitis  Allergic rhinitis is when the mucous membranes in the nose respond to allergens. Allergens are particles in the air that cause your body to have an allergic reaction. This causes you to release allergic antibodies. Through a chain of events, these eventually cause you to release histamine into the blood stream (hence the use of antihistamines). Although meant to be protective to the body, it is this release that causes your discomfort, such as frequent sneezing, congestion and an itchy runny nose.    CAUSES    The pollen allergens may come from grasses, trees, and weeds. This is seasonal allergic rhinitis, or "hay fever." Other allergens cause year-round allergic rhinitis (perennial allergic rhinitis) such as house dust mite allergen, pet dander and mold spores.    SYMPTOMS     Nasal stuffiness (congestion).   Runny, itchy nose with sneezing and tearing of the eyes.   There is often an itching of the mouth, eyes and ears.  It cannot be cured, but it can be controlled with medications.  DIAGNOSIS    If you are unable to determine the offending allergen, skin or blood testing may find it.  TREATMENT     Avoid the allergen.   Medications and allergy shots (immunotherapy) can help.   Hay fever may often be treated with antihistamines in pill or nasal spray forms. Antihistamines block the effects of histamine. There are over-the-counter medicines that may help with nasal congestion and swelling around the eyes. Check with your caregiver before taking or giving this medicine.  If the treatment above does not work, there are many new medications your caregiver can prescribe. Stronger medications may be used if initial measures are ineffective. Desensitizing injections can be used if medications and avoidance fails. Desensitization is when a patient is given ongoing shots until the body becomes less sensitive to the allergen. Make sure you follow up with your caregiver if problems continue.   SEEK MEDICAL CARE IF:     You develop fever (more than 100.5 F (38.1 C).   You develop a cough that does not stop easily (persistent).   You have shortness of breath.   You start wheezing.   Symptoms interfere with normal daily activities.  Document Released: 07/18/2001 Document Revised: 01/15/2012 Document Reviewed: 01/27/2009  Exit[employer] Patient Information 2013 [employer].

## 2012-10-15 ENCOUNTER — Telehealth: Payer: Self-pay | Admitting: Internal Medicine

## 2012-10-15 MED ORDER — AZITHROMYCIN 250 MG PO TABS: ORAL_TABLET | ORAL | Status: DC

## 2012-10-15 NOTE — Telephone Encounter (Signed)
Done erx 

## 2012-10-15 NOTE — Telephone Encounter (Signed)
Called Luke Hunt and lvm on his cell phone letting him know that Dr Jonny Ruiz sent an rx for azithromycin to the pharmacy.

## 2012-10-15 NOTE — Telephone Encounter (Signed)
Caller: Alim/Patient; Phone: [phone]; Reason for Call: Pt was seen yesterday at the office.  He is trying the antihistamine as prescribed.  Pt was to call back if he worsened for an antibiotic.  Pt gets yearly ear infections.  He just found out he needs to leave for [place] tomorrow and is worried about the flight and being out of town with no abx if it worsens.  He wants to request the antibiotic be called in.  Pt uses [facility]/[place].  Pt is asking for office to call him back when the med has been called in.

## 2013-05-26 ENCOUNTER — Encounter: Payer: BC Managed Care – PPO | Admitting: Internal Medicine

## 2013-06-30 ENCOUNTER — Telehealth: Payer: Self-pay

## 2013-06-30 ENCOUNTER — Other Ambulatory Visit (INDEPENDENT_AMBULATORY_CARE_PROVIDER_SITE_OTHER): Payer: BC Managed Care – PPO

## 2013-06-30 DIAGNOSIS — E785 Hyperlipidemia, unspecified: Secondary | ICD-10-CM

## 2013-06-30 DIAGNOSIS — Z0000 Encounter for general adult medical examination without abnormal findings: Secondary | ICD-10-CM

## 2013-06-30 LAB — BASIC METABOLIC PANEL
BUN: 14 mg/dL (ref 6–23)
CO2: 28 mEq/L (ref 19–32)
Calcium: 9.1 mg/dL (ref 8.4–10.5)
Chloride: 105 mEq/L (ref 96–112)
Creatinine, Ser: 1.1 mg/dL (ref 0.4–1.5)
GFR: 77.41 mL/min (ref >60.00)
Glucose, Bld: 118 mg/dL — ABNORMAL HIGH (ref 70–99)
Potassium: 4.2 mEq/L (ref 3.5–5.1)
Sodium: 139 mEq/L (ref 135–145)

## 2013-06-30 LAB — PSA: PSA: 0.41 ng/mL (ref 0.10–4.00)

## 2013-06-30 LAB — URINALYSIS, ROUTINE W REFLEX MICROSCOPIC
Bilirubin Urine: NEGATIVE
Ketones, ur: NEGATIVE
Leukocytes, UA: NEGATIVE
Nitrite: NEGATIVE
Specific Gravity, Urine: >=1.03 (ref 1.000–1.030)
Total Protein, Urine: NEGATIVE
Urine Glucose: NEGATIVE
Urobilinogen, UA: 0.2 (ref 0.0–1.0)
pH: 6 (ref 5.0–8.0)

## 2013-06-30 LAB — CBC WITH DIFFERENTIAL/PLATELET
Basophils Absolute: 0 10*3/uL (ref 0.0–0.1)
Basophils Relative: 0.5 % (ref 0.0–3.0)
Eosinophils Absolute: 0.1 10*3/uL (ref 0.0–0.7)
Eosinophils Relative: 1.4 % (ref 0.0–5.0)
HCT: 37.5 % — ABNORMAL LOW (ref 39.0–52.0)
Hemoglobin: 12.9 g/dL — ABNORMAL LOW (ref 13.0–17.0)
Lymphocytes Relative: 26.2 % (ref 12.0–46.0)
Lymphs Abs: 1.5 10*3/uL (ref 0.7–4.0)
MCHC: 34.3 g/dL (ref 30.0–36.0)
MCV: 87.2 fl (ref 78.0–100.0)
Monocytes Absolute: 0.4 10*3/uL (ref 0.1–1.0)
Monocytes Relative: 7.5 % (ref 3.0–12.0)
Neutro Abs: 3.8 10*3/uL (ref 1.4–7.7)
Neutrophils Relative %: 64.4 % (ref 43.0–77.0)
Platelets: 197 10*3/uL (ref 150.0–400.0)
RBC: 4.3 Mil/uL (ref 4.22–5.81)
RDW: 14 % (ref 11.5–14.6)
WBC: 5.8 10*3/uL (ref 4.5–10.5)

## 2013-06-30 LAB — HEPATIC FUNCTION PANEL
ALT: 24 U/L (ref 0–53)
AST: 26 U/L (ref 0–37)
Albumin: 3.7 g/dL (ref 3.5–5.2)
Alkaline Phosphatase: 43 U/L (ref 39–117)
Bilirubin, Direct: 0.1 mg/dL (ref 0.0–0.3)
Total Bilirubin: 0.7 mg/dL (ref 0.3–1.2)
Total Protein: 6.4 g/dL (ref 6.0–8.3)

## 2013-06-30 LAB — LIPID PANEL
Cholesterol: 211 mg/dL — ABNORMAL HIGH (ref 0–200)
HDL: 53.6 mg/dL (ref >39.00)
Total CHOL/HDL Ratio: 4
Triglycerides: 75 mg/dL (ref 0.0–149.0)
VLDL: 15 mg/dL (ref 0.0–40.0)

## 2013-06-30 LAB — LDL CHOLESTEROL, DIRECT: Direct LDL: 141.9 mg/dL

## 2013-06-30 LAB — TSH: TSH: 2.28 u[IU]/mL (ref 0.35–5.50)

## 2013-06-30 NOTE — Telephone Encounter (Signed)
CPX labs entered  

## 2013-07-01 ENCOUNTER — Ambulatory Visit: Payer: BC Managed Care – PPO

## 2013-07-01 DIAGNOSIS — R7309 Other abnormal glucose: Secondary | ICD-10-CM

## 2013-07-01 LAB — HEMOGLOBIN A1C: Hgb A1c MFr Bld: 5.8 % (ref 4.6–6.5)

## 2013-07-08 ENCOUNTER — Encounter: Payer: Self-pay | Admitting: Internal Medicine

## 2013-07-08 ENCOUNTER — Ambulatory Visit (INDEPENDENT_AMBULATORY_CARE_PROVIDER_SITE_OTHER): Payer: BC Managed Care – PPO | Admitting: Internal Medicine

## 2013-07-08 VITALS — BP 140/82 | HR 56 | Temp 98.3°F | Ht 73.0 in | Wt 232.5 lb

## 2013-07-08 DIAGNOSIS — Z0000 Encounter for general adult medical examination without abnormal findings: Secondary | ICD-10-CM

## 2013-07-08 DIAGNOSIS — I10 Essential (primary) hypertension: Secondary | ICD-10-CM

## 2013-07-08 DIAGNOSIS — R7309 Other abnormal glucose: Secondary | ICD-10-CM

## 2013-07-08 DIAGNOSIS — R7302 Impaired glucose tolerance (oral): Secondary | ICD-10-CM

## 2013-07-08 MED ORDER — LOSARTAN POTASSIUM 50 MG PO TABS: 50.0000 mg | ORAL_TABLET | Freq: Every day | ORAL | Status: DC

## 2013-07-08 NOTE — Assessment & Plan Note (Addendum)

## 2013-07-08 NOTE — Patient Instructions (Signed)
OK to take the losartan at 50 mg per day Please continue all other medications as before, and refills have been done if requested. Please continue your efforts at being more active, low cholesterol diet, and weight control. You are otherwise up to date with prevention measures today. Your left ear was irrigated of wax today  Please remember to sign up for My Chart if you have not done so, as this will be important to you in the future with finding out test results, communicating by private email, and scheduling acute appointments online when needed.  Please return in 1 year for your yearly visit, or sooner if needed, with Lab testing done 3-5 days before

## 2013-07-08 NOTE — Progress Notes (Signed)
Subjective:    Patient ID: Luke Hunt, male    DOB: 11-01-1971, 42 y.o.   MRN: [id number]  HPI  Here for wellness and f/u;  Overall doing ok;  Pt denies CP, worsening SOB, DOE, wheezing, orthopnea, PND, worsening LE edema, palpitations, dizziness or syncope.  Pt denies neurological change such as new headache, facial or extremity weakness.  Pt denies polydipsia, polyuria, or low sugar symptoms. Pt states overall good compliance with treatment and medications, good tolerability, and has been trying to follow lower cholesterol diet.  Pt denies worsening depressive symptoms, suicidal ideation or panic. No fever, night sweats, wt loss, loss of appetite, or other constitutional symptoms.  Pt states good ability with ADL's, has low fall risk, home safety reviewed and adequate, no other significant changes in hearing or vision, and very active with exercise 5 times per wk.  Due for surgury soon for torn left rot cuff.  Drinks 1 pint /wk total.  Past Medical History  Diagnosis Date  . ALLERGIC RHINITIS   . GERD   . HYPERLIPIDEMIA   . HYPERTENSION   . OBESITY   . OSA (obstructive sleep apnea) 05/23/2012   Past Surgical History  Procedure Laterality Date  . Appendectomy    . Rigth knee arthroscopy      @ [facility]    reports that he has quit smoking. He does not have any smokeless tobacco history on file. He reports that  drinks alcohol. His drug history is not on file. family history includes Diabetes in his other; Heart disease in his other; Stroke in his other. No Known Allergies Current Outpatient Prescriptions on File Prior to Visit  Medication Sig Dispense Refill  . losartan (COZAAR) 100 MG tablet Take 1 tablet (100 mg total) by mouth daily.  90 tablet  3   No current facility-administered medications on file prior to visit.    Review of Systems Constitutional: Negative for diaphoresis, activity change, appetite change or unexpected weight change.  HENT: Negative for hearing loss, ear  pain, facial swelling, mouth sores and neck stiffness.   Eyes: Negative for pain, redness and visual disturbance.  Respiratory: Negative for shortness of breath and wheezing.   Cardiovascular: Negative for chest pain and palpitations.  Gastrointestinal: Negative for diarrhea, blood in stool, abdominal distention or other pain Genitourinary: Negative for hematuria, flank pain or change in urine volume.  Musculoskeletal: Negative for myalgias and joint swelling.  Skin: Negative for color change and wound.  Neurological: Negative for syncope and numbness. other than noted Hematological: Negative for adenopathy.  Psychiatric/Behavioral: Negative for hallucinations, self-injury, decreased concentration and agitation.      Objective:   Physical Exam BP 140/82  Pulse 56  Temp(Src) 98.3 F (36.8 C) (Oral)  Ht 6\' 1"  (1.854 m)  Wt 232 lb 8 oz (105.461 kg)  BMI 30.68 kg/m2  SpO2 97% VS noted,  Constitutional: Pt is oriented to person, place, and time. Appears well-developed and well-nourished.  Head: Normocephalic and atraumatic.  Right Ear: External ear normal.  Left Ear: External ear normal.  Nose: Nose normal.  Mouth/Throat: Oropharynx is clear and moist.  Eyes: Conjunctivae and EOM are normal. Pupils are equal, round, and reactive to light.  Neck: Normal range of motion. Neck supple. No JVD present. No tracheal deviation present.  Cardiovascular: Normal rate, regular rhythm, normal heart sounds and intact distal pulses.   Pulmonary/Chest: Effort normal and breath sounds normal.  Abdominal: Soft. Bowel sounds are normal. There is no tenderness. No HSM  Musculoskeletal: Normal range of motion. Exhibits no edema.  Lymphadenopathy:  Has no cervical adenopathy.  Neurological: Pt is alert and oriented to person, place, and time. Pt has normal reflexes. No cranial nerve deficit.  Skin: Skin is warm and dry. No rash noted.  Psychiatric:  Has  normal mood and affect. Behavior is normal.      Assessment & Plan:

## 2013-07-08 NOTE — Assessment & Plan Note (Signed)
Ok for losartan 50 mg qd (has not been taking recnetly, BP mild elev tody and has lost some wt)

## 2013-07-08 NOTE — Assessment & Plan Note (Signed)
a1c normal, cont wt control and diet

## 2013-08-18 ENCOUNTER — Encounter: Payer: BC Managed Care – PPO | Admitting: Internal Medicine

## 2013-10-16 ENCOUNTER — Ambulatory Visit: Payer: BC Managed Care – PPO | Admitting: Internal Medicine

## 2013-10-24 ENCOUNTER — Encounter: Payer: Self-pay | Admitting: Internal Medicine

## 2013-10-24 ENCOUNTER — Other Ambulatory Visit (INDEPENDENT_AMBULATORY_CARE_PROVIDER_SITE_OTHER): Payer: BC Managed Care – PPO

## 2013-10-24 ENCOUNTER — Ambulatory Visit (INDEPENDENT_AMBULATORY_CARE_PROVIDER_SITE_OTHER)
Admission: RE | Admit: 2013-10-24 | Discharge: 2013-10-24 | Disposition: A | Discharge status: Home / Self Care | Payer: BC Managed Care – PPO | Source: Ambulatory Visit | Attending: Internal Medicine | Admitting: Internal Medicine

## 2013-10-24 ENCOUNTER — Ambulatory Visit (INDEPENDENT_AMBULATORY_CARE_PROVIDER_SITE_OTHER): Payer: BC Managed Care – PPO | Admitting: Internal Medicine

## 2013-10-24 VITALS — BP 112/68 | HR 65 | Temp 99.3°F | Ht 72.0 in | Wt 253.0 lb

## 2013-10-24 DIAGNOSIS — I10 Essential (primary) hypertension: Secondary | ICD-10-CM

## 2013-10-24 DIAGNOSIS — J340 Abscess, furuncle and carbuncle of nose: Secondary | ICD-10-CM

## 2013-10-24 DIAGNOSIS — R109 Unspecified abdominal pain: Secondary | ICD-10-CM

## 2013-10-24 DIAGNOSIS — R7302 Impaired glucose tolerance (oral): Secondary | ICD-10-CM

## 2013-10-24 DIAGNOSIS — R7309 Other abnormal glucose: Secondary | ICD-10-CM

## 2013-10-24 DIAGNOSIS — L0201 Cutaneous abscess of face: Secondary | ICD-10-CM

## 2013-10-24 LAB — URINALYSIS, ROUTINE W REFLEX MICROSCOPIC
Bilirubin Urine: NEGATIVE
Hgb urine dipstick: NEGATIVE
Ketones, ur: NEGATIVE
Leukocytes, UA: NEGATIVE
Nitrite: NEGATIVE
Specific Gravity, Urine: 1.02 (ref 1.000–1.030)
Total Protein, Urine: NEGATIVE
Urine Glucose: NEGATIVE
Urobilinogen, UA: 0.2 (ref 0.0–1.0)
pH: 6 (ref 5.0–8.0)

## 2013-10-24 LAB — BASIC METABOLIC PANEL
BUN: 14 mg/dL (ref 6–23)
CO2: 28 mEq/L (ref 19–32)
Calcium: 9.4 mg/dL (ref 8.4–10.5)
Chloride: 108 mEq/L (ref 96–112)
Creatinine, Ser: 1.2 mg/dL (ref 0.4–1.5)
GFR: 72.02 mL/min (ref >60.00)
Glucose, Bld: 105 mg/dL — ABNORMAL HIGH (ref 70–99)
Potassium: 4.3 mEq/L (ref 3.5–5.1)
Sodium: 141 mEq/L (ref 135–145)

## 2013-10-24 LAB — CBC WITH DIFFERENTIAL/PLATELET
Basophils Absolute: 0 10*3/uL (ref 0.0–0.1)
Basophils Relative: 0.3 % (ref 0.0–3.0)
Eosinophils Absolute: 0 10*3/uL (ref 0.0–0.7)
Eosinophils Relative: 0.3 % (ref 0.0–5.0)
HCT: 38.8 % — ABNORMAL LOW (ref 39.0–52.0)
Hemoglobin: 13.1 g/dL (ref 13.0–17.0)
Lymphocytes Relative: 19.1 % (ref 12.0–46.0)
Lymphs Abs: 1.7 10*3/uL (ref 0.7–4.0)
MCHC: 33.8 g/dL (ref 30.0–36.0)
MCV: 87.3 fl (ref 78.0–100.0)
Monocytes Absolute: 0.6 10*3/uL (ref 0.1–1.0)
Monocytes Relative: 6.7 % (ref 3.0–12.0)
Neutro Abs: 6.4 10*3/uL (ref 1.4–7.7)
Neutrophils Relative %: 73.6 % (ref 43.0–77.0)
Platelets: 222 10*3/uL (ref 150.0–400.0)
RBC: 4.45 Mil/uL (ref 4.22–5.81)
RDW: 14 % (ref 11.5–14.6)
WBC: 8.7 10*3/uL (ref 4.5–10.5)

## 2013-10-24 LAB — HEPATIC FUNCTION PANEL
ALT: 22 U/L (ref 0–53)
AST: 22 U/L (ref 0–37)
Albumin: 4 g/dL (ref 3.5–5.2)
Alkaline Phosphatase: 42 U/L (ref 39–117)
Bilirubin, Direct: 0.2 mg/dL (ref 0.0–0.3)
Total Bilirubin: 1.3 mg/dL — ABNORMAL HIGH (ref 0.3–1.2)
Total Protein: 6.8 g/dL (ref 6.0–8.3)

## 2013-10-24 LAB — LIPASE: Lipase: 32 U/L (ref 11.0–59.0)

## 2013-10-24 MED ORDER — MUPIROCIN CALCIUM 2 % NA OINT: 1.0000 "application " | TOPICAL_OINTMENT | Freq: Two times a day (BID) | NASAL | Status: DC

## 2013-10-24 MED ORDER — HYDROCORTISONE ACETATE 25 MG RE SUPP: 25.0000 mg | Freq: Two times a day (BID) | RECTAL | Status: DC

## 2013-10-24 MED ORDER — DOXYCYCLINE HYCLATE 100 MG PO TABS: 100.0000 mg | ORAL_TABLET | Freq: Two times a day (BID) | ORAL | Status: DC

## 2013-10-24 NOTE — Assessment & Plan Note (Signed)
With hx of constipation, ? Mild distension, feeling of obstruction/difficult to pass, ? Hemorrhoid, no BRBPR or wt loss or other constitutional symptoms, per pt request will be urgent GI referral, cont meds as is, anusol HC supp prn, for labs and film - r/o obstruction, and if wbc elevated would ocnsider CT r/o diverticulitis (but doubt fever today related to abd)

## 2013-10-24 NOTE — Progress Notes (Signed)
Subjective:    Patient ID: Luke Hunt, male    DOB: 04-Feb-1971, 42 y.o.   MRN: [id number]  HPI  Here with acute - c/o sores to the inside of the left nose, worse for 2 mo, seems better then worse, has low grade temp today but typically no fever. Works as Production designer, theatre/television/film for [employer]. No known hx of MRSA or other. Has actually been off and on for 1 yr. No other abscess or skin problems.  Does have occas cold sore to the lips but this is different.    Also and more concenrning to him is BM difficulty, for approx 1 yr seems progressivly worse with difficulty passing stool, only way to seem to have BM in the past wk is mult med - stool softner, laxitative and enema at the same time; just started benefiber 3 days ago, miserable with anal and lower abd pain recurring, thinks may have hemorrhoid, no blood, has low grade temp today, some slight nausea occas, no vomiting, appetite ok, has gained wt - 20 lbs in 3 mo, ? Maybe less active but still able to go to gym 4 times per wk. Has ongoing right lower back pain, but does not think related. No prior colonoscopy. Denies urinary symptoms such as dysuria, frequency, urgency, flank pain, hematuria or n/v, fever, chills. Lifts wts despite need for left rot cuff tear, o/w unusual joint pain or swelling (only knees, shoulder and right back)  No OTC supplements. S/p appy. Has some abd distension with recent constipt and wt gain, but no worsening reflux, does use occas tums.  Drinks 5th liquor per wk, hard to stop.  Separated from wife x 6 mo. Past Medical History  Diagnosis Date  . ALLERGIC RHINITIS   . GERD   . HYPERLIPIDEMIA   . HYPERTENSION   . OBESITY   . OSA (obstructive sleep apnea) 05/23/2012   Past Surgical History  Procedure Laterality Date  . Appendectomy    . Rigth knee arthroscopy      @ [facility]    reports that he has quit smoking. He does not have any smokeless tobacco history on file. He reports that he drinks alcohol. His  drug history is not on file. family history includes Diabetes in his other; Heart disease in his other; Stroke in his other. No Known Allergies Current Outpatient Prescriptions on File Prior to Visit  Medication Sig Dispense Refill  . losartan (COZAAR) 50 MG tablet Take 1 tablet (50 mg total) by mouth daily.  30 tablet  11   No current facility-administered medications on file prior to visit.   Review of Systems  Constitutional: Negative for unexpected weight change, or unusual diaphoresis  HENT: Negative for tinnitus.   Eyes: Negative for photophobia and visual disturbance.  Respiratory: Negative for choking and stridor.   Gastrointestinal: Negative for vomiting and blood in stool.  Genitourinary: Negative for hematuria and decreased urine volume.  Musculoskeletal: Negative for acute joint swelling Skin: Negative for color change and wound.  Neurological: Negative for tremors and numbness other than noted  Psychiatric/Behavioral: Negative for decreased concentration or  hyperactivity.       Objective:   Physical Exam BP 112/68  Pulse 65  Temp(Src) 99.3 F (37.4 C) (Oral)  Ht 6' (1.829 m)  Wt 253 lb (114.76 kg)  BMI 34.31 kg/m2  SpO2 96% VS noted,  Constitutional: Pt appears well-developed and well-nourished.  HENT: Head: NCAT.  Right Ear: External ear normal.  Left  Ear: External ear normal.  Eyes: Conjunctivae and EOM are normal. Pupils are equal, round, and reactive to light.  Nose with 1 cm area red/tender/swelling, no drainage Neck: Normal range of motion. Neck supple.  Cardiovascular: Normal rate and regular rhythm.   Pulmonary/Chest: Effort normal and breath sounds normal.  Abd:  Soft, ? Mild distended, + BS, diffuse lower tender left > right but no guarding/rebound Neurological: Pt is alert. Not confused  Skin: Skin is warm. No erythema.  Psychiatric: Pt behavior is normal. Thought content normal.     Assessment & Plan:

## 2013-10-24 NOTE — Assessment & Plan Note (Signed)
Cant r/o mrsa, ok for doxy course and bactroban asd

## 2013-10-24 NOTE — Patient Instructions (Signed)
Please take all new medication as prescribed - the suppository, and antibiotics Please continue all other medications as before, and refills have been done if requested. Please have the pharmacy call with any other refills you may need. Please go to the XRAY Department in the Basement (go straight as you get off the elevator) for the x-ray testing Please go to the LAB in the Basement (turn left off the elevator) for the tests to be done today You will be contacted regarding the referral for: GI  Please remember to sign up for My Chart if you have not done so, as this will be important to you in the future with finding out test results, communicating by private email, and scheduling acute appointments online when needed.

## 2013-10-24 NOTE — Progress Notes (Signed)
Pre-visit discussion using our clinic review tool. No additional management support is needed unless otherwise documented below in the visit note.  

## 2013-10-29 NOTE — Assessment & Plan Note (Signed)
stable overall by history and exam, recent data reviewed with pt, and pt to continue medical treatment as before,  to f/u any worsening symptoms or concerns Lab Results  Component Value Date   HGBA1C 5.8 07/01/2013

## 2013-10-29 NOTE — Assessment & Plan Note (Signed)
stable overall by history and exam, recent data reviewed with pt, and pt to continue medical treatment as before,  to f/u any worsening symptoms or concerns BP Readings from Last 3 Encounters:  10/24/13 112/68  07/08/13 140/82  10/14/12 110/76

## 2013-12-05 ENCOUNTER — Telehealth: Payer: Self-pay | Admitting: Internal Medicine

## 2013-12-05 MED ORDER — HYDROCORTISONE ACETATE 25 MG RE SUPP: 25.0000 mg | Freq: Two times a day (BID) | RECTAL | Status: DC

## 2013-12-05 NOTE — Telephone Encounter (Signed)
Patient is calling to request a new prescription for hydrocortisone (ANUSOL-HC) 25 MG suppository to be sent to [facility] on [address]. States that it worked really well for him last time Dr. Jonny RuizJohn prescribed it. Please advise.

## 2014-02-12 ENCOUNTER — Ambulatory Visit (INDEPENDENT_AMBULATORY_CARE_PROVIDER_SITE_OTHER): Payer: BC Managed Care – PPO | Admitting: Internal Medicine

## 2014-02-12 VITALS — BP 112/62 | HR 57 | Temp 98.7°F | Resp 15 | Ht 74.0 in | Wt 259.6 lb

## 2014-02-12 DIAGNOSIS — J069 Acute upper respiratory infection, unspecified: Secondary | ICD-10-CM

## 2014-02-12 MED ORDER — AMOXICILLIN 500 MG PO CAPS: 500.0000 mg | ORAL_CAPSULE | Freq: Three times a day (TID) | ORAL | Status: DC

## 2014-02-12 NOTE — Patient Instructions (Addendum)
Plain Mucinex (NOT D) for thick secretions ;force NON dairy fluids .   Nasal cleansing in the shower as discussed with lather of mild shampoo.After 10 seconds wash off lather while  exhaling through nostrils. Make sure that all residual soap is removed to prevent irritation.  Flonase OR Nasacort AQ 1 spray in each nostril twice a day as needed. Use the "crossover" technique into opposite nostril spraying toward opposite ear @ 45 degree angle, not straight up into nostril.  Use a Neti pot daily only  as needed for significant sinus congestion; going from open side to congested side . Plain Allegra (NOT D )  160 daily , Loratidine 10 mg , OR Zyrtec 10 mg @ bedtime  as needed for itchy eyes & sneezing. Fill the  prescription for antibiotic it there is not dramatic improvement in the next 72 hours. Minimal Blood Pressure Goal= AVERAGE < 140/90;  Ideal is an AVERAGE < 135/85. This AVERAGE should be calculated from @ least 5-7 BP readings taken @ different times of day on different days of week. You should not respond to isolated BP readings , but rather the AVERAGE for that week .Please bring your  blood pressure cuff to office visits to verify that it is reliable.It  can also be checked against the blood pressure device at the pharmacy. Finger or wrist cuffs are not dependable; an arm cuff is. 

## 2014-02-12 NOTE — Progress Notes (Signed)
   Subjective:    Patient ID: Luke StageBryan T. Hunt, male    DOB: 05/02/1971, 43 y.o.   MRN: [id number]  HPI  Symptoms began 02/10/14 as rhinitis and head congestion. His daughter had been ill with an upper respiratory tract infection.  He had a nonproductive cough as well as itchy, watery eyes and sneezing. He had night sweats on one occasion  Symptoms were treated with DayQuil, Alka-Seltzer day and night, and Afrin with partial response  He persists in having frontal headache, facial pain,  some wheezing as well as shortness of breath.   Review of Systems  He denies purulent nasal secretions, dental pain, sore throat, otic discharge, or significant myalgias/arthralgias.    Objective:   Physical Exam General appearance:good health ;well nourished; no acute distress or increased work of breathing is present.  No  lymphadenopathy about the head, neck, or axilla noted.   Eyes: No conjunctival inflammation or lid edema is present. There is no scleral icterus.  Ears:  External ear exam shows no significant lesions or deformities.  Otoscopic examination reveals clear canals, tympanic membranes are intact bilaterally without bulging, retraction, inflammation or discharge.  Nose:  External nasal examination shows no deformity or inflammation. Nasal mucosa are pink and moist without lesions or exudates. Rightward septal  Deviation.Hyponasal speech.Some R nare obstruction to airflow.   Oral exam: Dental hygiene is good; lips and gums are healthy appearing.There is no oropharyngeal erythema or exudate noted.   Neck:  No deformities, masses, or tenderness noted.   Supple with full range of motion without pain.   Heart:  Normal rate and regular rhythm. S1 and S2 normal without gallop, murmur, click, rub or other extra sounds.   Lungs:Chest clear to auscultation; no wheezes, rhonchi,rales ,or rubs present.No increased work of breathing.    Extremities:  No cyanosis, edema, or clubbing  noted    Skin:  Warm & dry w/o jaundice or tenting.         Assessment & Plan:  #1 URI w/o definite sinusitis See orders

## 2014-03-31 ENCOUNTER — Telehealth: Payer: Self-pay | Admitting: Internal Medicine

## 2014-03-31 MED ORDER — HYDROCORTISONE ACETATE 25 MG RE SUPP: 25.0000 mg | Freq: Two times a day (BID) | RECTAL | Status: DC

## 2014-03-31 NOTE — Telephone Encounter (Signed)
Done erx 

## 2014-03-31 NOTE — Telephone Encounter (Signed)
Patient is calling to request a prescription for hydrocortisone (ANUSOL-HC) 25 MG suppository to [facility] [address]. Please advise. Patient can be reached back at his work number on file during the day.

## 2014-03-31 NOTE — Telephone Encounter (Signed)
Patient informed. 

## 2014-05-16 ENCOUNTER — Emergency Department (HOSPITAL_COMMUNITY)
Admission: EM | Admit: 2014-05-16 | Discharge: 2014-05-17 | Disposition: A | Discharge status: Home / Self Care | Payer: BC Managed Care – PPO | Attending: Emergency Medicine | Admitting: Emergency Medicine

## 2014-05-16 ENCOUNTER — Encounter (HOSPITAL_COMMUNITY): Payer: Self-pay | Admitting: Emergency Medicine

## 2014-05-16 ENCOUNTER — Emergency Department (HOSPITAL_COMMUNITY): Payer: BC Managed Care – PPO

## 2014-05-16 DIAGNOSIS — Z8719 Personal history of other diseases of the digestive system: Secondary | ICD-10-CM | POA: Insufficient documentation

## 2014-05-16 DIAGNOSIS — Z87891 Personal history of nicotine dependence: Secondary | ICD-10-CM | POA: Insufficient documentation

## 2014-05-16 DIAGNOSIS — I10 Essential (primary) hypertension: Secondary | ICD-10-CM | POA: Insufficient documentation

## 2014-05-16 DIAGNOSIS — E669 Obesity, unspecified: Secondary | ICD-10-CM | POA: Insufficient documentation

## 2014-05-16 DIAGNOSIS — M7989 Other specified soft tissue disorders: Secondary | ICD-10-CM | POA: Insufficient documentation

## 2014-05-16 DIAGNOSIS — R0602 Shortness of breath: Secondary | ICD-10-CM | POA: Insufficient documentation

## 2014-05-16 DIAGNOSIS — R059 Cough, unspecified: Secondary | ICD-10-CM | POA: Insufficient documentation

## 2014-05-16 DIAGNOSIS — R05 Cough: Secondary | ICD-10-CM | POA: Insufficient documentation

## 2014-05-16 DIAGNOSIS — R0789 Other chest pain: Secondary | ICD-10-CM | POA: Insufficient documentation

## 2014-05-16 DIAGNOSIS — Z8639 Personal history of other endocrine, nutritional and metabolic disease: Secondary | ICD-10-CM | POA: Insufficient documentation

## 2014-05-16 DIAGNOSIS — Z862 Personal history of diseases of the blood and blood-forming organs and certain disorders involving the immune mechanism: Secondary | ICD-10-CM | POA: Insufficient documentation

## 2014-05-16 DIAGNOSIS — Z8709 Personal history of other diseases of the respiratory system: Secondary | ICD-10-CM | POA: Insufficient documentation

## 2014-05-16 LAB — COMPREHENSIVE METABOLIC PANEL
ALT: 29 U/L (ref 0–53)
AST: 29 U/L (ref 0–37)
Albumin: 3.9 g/dL (ref 3.5–5.2)
Alkaline Phosphatase: 60 U/L (ref 39–117)
Anion gap: 16 — ABNORMAL HIGH (ref 5–15)
BUN: 15 mg/dL (ref 6–23)
CO2: 25 mEq/L (ref 19–32)
Calcium: 10.7 mg/dL — ABNORMAL HIGH (ref 8.4–10.5)
Chloride: 99 mEq/L (ref 96–112)
Creatinine, Ser: 0.99 mg/dL (ref 0.50–1.35)
GFR calc Af Amer: >90 mL/min (ref >90)
GFR calc non Af Amer: >90 mL/min (ref >90)
Glucose, Bld: 115 mg/dL — ABNORMAL HIGH (ref 70–99)
Potassium: 4.2 mEq/L (ref 3.7–5.3)
Sodium: 140 mEq/L (ref 137–147)
Total Bilirubin: 0.5 mg/dL (ref 0.3–1.2)
Total Protein: 7.1 g/dL (ref 6.0–8.3)

## 2014-05-16 LAB — CBC
HCT: 39.5 % (ref 39.0–52.0)
Hemoglobin: 13.2 g/dL (ref 13.0–17.0)
MCH: 29.1 pg (ref 26.0–34.0)
MCHC: 33.4 g/dL (ref 30.0–36.0)
MCV: 87.2 fL (ref 78.0–100.0)
Platelets: 199 10*3/uL (ref 150–400)
RBC: 4.53 MIL/uL (ref 4.22–5.81)
RDW: 13.7 % (ref 11.5–15.5)
WBC: 5.8 10*3/uL (ref 4.0–10.5)

## 2014-05-16 LAB — I-STAT TROPONIN, ED: Troponin i, poc: 0.01 ng/mL (ref 0.00–0.08)

## 2014-05-16 LAB — D-DIMER, QUANTITATIVE (NOT AT ARMC): D-Dimer, Quant: 0.27 ug/mL-FEU (ref 0.00–0.48)

## 2014-05-16 LAB — PRO B NATRIURETIC PEPTIDE: Pro B Natriuretic peptide (BNP): 17.4 pg/mL (ref 0–125)

## 2014-05-16 NOTE — ED Provider Notes (Signed)
CSN: [id number]     Arrival date & time 05/16/14  2135 History   First MD Initiated Contact with Patient 05/16/14 2215     Chief Complaint  Patient presents with  . Chest Pain  . Shortness of Breath  . Leg Swelling     (Consider location/radiation/quality/duration/timing/severity/associated sxs/prior Treatment) HPI Comments: Patient complains of chest tightness that onset around 1 PM has been constant. He just returned this evening from travel to [place] and had a 10 hour flight followed by another 2 hour flight and a drive from [place]. He feels tightness in his chest feels like bronchitis". He endorses shortness of breath and dry cough. He complains of bilateral foot and ankle swelling he noticed this evening as well. No history of blood clots that is his concern. He denies any history of heart problems or lung problems. He has a history of hypertension. His mother has had blood clots in the past.  The history is provided by the patient.    Past Medical History  Diagnosis Date  . ALLERGIC RHINITIS   . GERD   . HYPERLIPIDEMIA   . HYPERTENSION   . OBESITY   . OSA (obstructive sleep apnea) 05/23/2012   Past Surgical History  Procedure Laterality Date  . Appendectomy    . Rigth knee arthroscopy      @ [facility]   Family History  Problem Relation Age of Onset  . Stroke Other   . Diabetes Other   . Heart disease Other    History  Substance Use Topics  . Smoking status: Former Games developer  . Smokeless tobacco: Not on file  . Alcohol Use: Yes    Review of Systems  Constitutional: Negative for fever, activity change and appetite change.  Respiratory: Positive for cough, chest tightness and shortness of breath.   Cardiovascular: Positive for chest pain and leg swelling.  Gastrointestinal: Negative for nausea, vomiting and abdominal pain.  Genitourinary: Negative for dysuria, hematuria and testicular pain.  Musculoskeletal: Positive for joint swelling. Negative for arthralgias  and myalgias.  Skin: Negative for rash.  Neurological: Negative for dizziness, weakness and headaches.  A complete 10 system review of systems was obtained and all systems are negative except as noted in the HPI and PMH.      Allergies  Review of patient's allergies indicates no known allergies.  Home Medications   Prior to Admission medications   Not on File   BP 132/74  Pulse 57  Temp(Src) 98 F (36.7 C) (Oral)  Resp 20  SpO2 96% Physical Exam  Nursing note and vitals reviewed. Constitutional: He is oriented to person, place, and time. He appears well-developed and well-nourished. No distress.  Speaking in full sentences  HENT:  Head: Normocephalic and atraumatic.  Mouth/Throat: Oropharynx is clear and moist. No oropharyngeal exudate.  Eyes: Conjunctivae and EOM are normal. Pupils are equal, round, and reactive to light.  Neck: Normal range of motion. Neck supple.  No meningismus.  Cardiovascular: Normal rate, regular rhythm, normal heart sounds and intact distal pulses.   No murmur heard. Pulmonary/Chest: Effort normal and breath sounds normal. No respiratory distress.  Abdominal: Soft. There is no tenderness. There is no rebound and no guarding.  Musculoskeletal: Normal range of motion. He exhibits tenderness. He exhibits no edema.  Trace pedal edema and dorsal foot edema bilaterally. Distal pulses. No calf asymmetry or swelling.  Neurological: He is alert and oriented to person, place, and time. No cranial nerve deficit. He exhibits normal muscle tone.  Coordination normal.  No ataxia on finger to nose bilaterally. No pronator drift. 5/5 strength throughout. CN 2-12 intact. Negative Romberg. Equal grip strength. Sensation intact. Gait is normal.   Skin: Skin is warm.  Psychiatric: He has a normal mood and affect. His behavior is normal.    ED Course  Procedures (including critical care time) Labs Review Labs Reviewed  COMPREHENSIVE METABOLIC PANEL - Abnormal;  Notable for the following:    Glucose, Bld 115 (*)    Calcium 10.7 (*)    Anion gap 16 (*)    All other components within normal limits  CBC  PRO B NATRIURETIC PEPTIDE  D-DIMER, QUANTITATIVE  I-STAT TROPOININ, ED    Imaging Review Dg Chest 2 View  05/16/2014   CLINICAL DATA:  Chest pain and shortness of breath. Bilateral lower leg swelling.  EXAM: CHEST  2 VIEW  COMPARISON:  10/24/2013  FINDINGS: The heart size and mediastinal contours are within normal limits. Both lungs are clear. The visualized skeletal structures are unremarkable.  IMPRESSION: No active cardiopulmonary disease.   Electronically Signed   By: Burman NievesWilliam  Stevens M.D.   On: 05/16/2014 23:56     EKG Interpretation   Date/Time:  Saturday May 16 2014 21:40:40 EDT Ventricular Rate:  60 PR Interval:  160 QRS Duration: 106 QT Interval:  402 QTC Calculation: 402 R Axis:   56 Text Interpretation:  Normal sinus rhythm Possible Lateral infarct , age  undetermined Abnormal ECG No significant change was found Confirmed by  Manus GunningANCOUR  MD, Eleen Litz [phone]) on 05/16/2014 10:27:18 PM      MDM   Final diagnoses:  Atypical chest pain  Leg swelling   Chest tightness, shortness of breath, leg swelling after plane travel. EKG normal sinus rhythm.  Chest pain is atypical for ACS. Sinus rhythm  Chest x-ray is negative. Troponin negative. D-dimer negative.  PE especially unlikely in setting of negative d-dimer, no hypoxia or tachycardia. Leg swelling likely secondary to venous stasis and prolonged periods of sitting on the airplane.  Patient and family still concerned for a blood clot and will return for ultrasound tomorrow despite negative d-dimer. Given low likelihood of DVT, will not dose lovenox tonight.   Return to the ED with worsening CP, SOB, or any other concerns.  BP 132/74  Pulse 57  Temp(Src) 98 F (36.7 C) (Oral)  Resp 20  SpO2 96%     Glynn OctaveStephen Zakaree Mcclenahan, MD 05/17/14 [phone]

## 2014-05-16 NOTE — ED Notes (Signed)
Pt just got home at 7pm tonite and now he has swelling in BLE and tightness in calves.  Pt states tightness in chest and having sob started around 1pm today.  Pt was flying today for 13 hours and then 2 hour drive home.  Pt concerned for blood clot

## 2014-05-16 NOTE — ED Notes (Signed)
Pt taken to xray 

## 2014-05-17 ENCOUNTER — Ambulatory Visit (HOSPITAL_COMMUNITY): Payer: BC Managed Care – PPO | Attending: Emergency Medicine

## 2014-05-17 NOTE — Discharge Instructions (Signed)
Chest Pain (Nonspecific) There is no evidence of heart attack or blood clot in the lung. Keep your legs elevated.  Return tomorrow for ultrasounds of your legs tomorrow if your swelling persists. Return to the ED if you develop worsening chest pain, shortness of breath, or any other symptoms. It is often hard to give a specific diagnosis for the cause of chest pain. There is always a chance that your pain could be related to something serious, such as a heart attack or a blood clot in the lungs. You need to follow up with your health care provider for further evaluation. CAUSES   Heartburn.  Pneumonia or bronchitis.  Anxiety or stress.  Inflammation around your heart (pericarditis) or lung (pleuritis or pleurisy).  A blood clot in the lung.  A collapsed lung (pneumothorax). It can develop suddenly on its own (spontaneous pneumothorax) or from trauma to the chest.  Shingles infection (herpes zoster virus). The chest wall is composed of bones, muscles, and cartilage. Any of these can be the source of the pain.  The bones can be bruised by injury.  The muscles or cartilage can be strained by coughing or overwork.  The cartilage can be affected by inflammation and become sore (costochondritis). DIAGNOSIS  Lab tests or other studies may be needed to find the cause of your pain. Your health care provider may have you take a test called an ambulatory electrocardiogram (ECG). An ECG records your heartbeat patterns over a 24-hour period. You may also have other tests, such as:  Transthoracic echocardiogram (TTE). During echocardiography, sound waves are used to evaluate how blood flows through your heart.  Transesophageal echocardiogram (TEE).  Cardiac monitoring. This allows your health care provider to monitor your heart rate and rhythm in real time.  Holter monitor. This is a portable device that records your heartbeat and can help diagnose heart arrhythmias. It allows your health care  provider to track your heart activity for several days, if needed.  Stress tests by exercise or by giving medicine that makes the heart beat faster. TREATMENT   Treatment depends on what may be causing your chest pain. Treatment may include:  Acid blockers for heartburn.  Anti-inflammatory medicine.  Pain medicine for inflammatory conditions.  Antibiotics if an infection is present.  You may be advised to change lifestyle habits. This includes stopping smoking and avoiding alcohol, caffeine, and chocolate.  You may be advised to keep your head raised (elevated) when sleeping. This reduces the chance of acid going backward from your stomach into your esophagus. Most of the time, nonspecific chest pain will improve within 2-3 days with rest and mild pain medicine.  HOME CARE INSTRUCTIONS   If antibiotics were prescribed, take them as directed. Finish them even if you start to feel better.  For the next few days, avoid physical activities that bring on chest pain. Continue physical activities as directed.  Do not use any tobacco products, including cigarettes, chewing tobacco, or electronic cigarettes.  Avoid drinking alcohol.  Only take medicine as directed by your health care provider.  Follow your health care provider's suggestions for further testing if your chest pain does not go away.  Keep any follow-up appointments you made. If you do not go to an appointment, you could develop lasting (chronic) problems with pain. If there is any problem keeping an appointment, call to reschedule. SEEK MEDICAL CARE IF:   Your chest pain does not go away, even after treatment.  You have a rash with blisters  on your chest.  You have a fever. SEEK IMMEDIATE MEDICAL CARE IF:   You have increased chest pain or pain that spreads to your arm, neck, jaw, back, or abdomen.  You have shortness of breath.  You have an increasing cough, or you cough up blood.  You have severe back or  abdominal pain.  You feel nauseous or vomit.  You have severe weakness.  You faint.  You have chills. This is an emergency. Do not wait to see if the pain will go away. Get medical help at once. Call your local emergency services (911 in [place] Do not drive yourself to the hospital. MAKE SURE YOU:   Understand these instructions.  Will watch your condition.  Will get help right away if you are not doing well or get worse. Document Released: 08/02/2005 Document Revised: 10/28/2013 Document Reviewed: 05/28/2008 [facility] Patient Information 2015 [place], [place]. This information is not intended to replace advice given to you by your health care provider. Make sure you discuss any questions you have with your health care provider.

## 2014-05-20 ENCOUNTER — Encounter: Payer: Self-pay | Admitting: Internal Medicine

## 2014-05-20 ENCOUNTER — Ambulatory Visit (INDEPENDENT_AMBULATORY_CARE_PROVIDER_SITE_OTHER): Payer: BC Managed Care – PPO | Admitting: Internal Medicine

## 2014-05-20 VITALS — BP 130/84 | HR 51 | Temp 98.2°F | Wt 255.0 lb

## 2014-05-20 DIAGNOSIS — R0609 Other forms of dyspnea: Secondary | ICD-10-CM

## 2014-05-20 DIAGNOSIS — J310 Chronic rhinitis: Secondary | ICD-10-CM

## 2014-05-20 DIAGNOSIS — R609 Edema, unspecified: Secondary | ICD-10-CM

## 2014-05-20 DIAGNOSIS — R0600 Dyspnea, unspecified: Secondary | ICD-10-CM

## 2014-05-20 DIAGNOSIS — R0989 Other specified symptoms and signs involving the circulatory and respiratory systems: Secondary | ICD-10-CM

## 2014-05-20 DIAGNOSIS — J209 Acute bronchitis, unspecified: Secondary | ICD-10-CM

## 2014-05-20 DIAGNOSIS — R0789 Other chest pain: Secondary | ICD-10-CM

## 2014-05-20 MED ORDER — AMOXICILLIN 500 MG PO CAPS: 500.0000 mg | ORAL_CAPSULE | Freq: Three times a day (TID) | ORAL | Status: DC

## 2014-05-20 MED ORDER — HYDROCODONE-HOMATROPINE 5-1.5 MG/5ML PO SYRP: 5.0000 mL | ORAL_SOLUTION | Freq: Four times a day (QID) | ORAL | Status: DC | PRN

## 2014-05-20 NOTE — Progress Notes (Signed)
Pre visit review using our clinic review tool, if applicable. No additional management support is needed unless otherwise documented below in the visit note. 

## 2014-05-20 NOTE — Patient Instructions (Signed)
Plain Mucinex (NOT D) for thick secretions ;force NON dairy fluids .   Nasal cleansing in the shower as discussed with lather of mild shampoo.After 10 seconds wash off lather while  exhaling through nostrils. Make sure that all residual soap is removed to prevent irritation.  Flonase OR Nasacort AQ 1 spray in each nostril twice a day as needed. Use the "crossover" technique into opposite nostril spraying toward opposite ear @ 45 degree angle, not straight up into nostril.  Use a Neti pot daily only  as needed for significant sinus congestion; going from open side to congested side . Plain Allegra (NOT D )  160 daily , Loratidine 10 mg , OR Zyrtec 10 mg @ bedtime  as needed for itchy eyes & sneezing.   When flying please wear support hose and tennis shoes. Perform isometric exercises at least hourly and ambulate as much is possible in flight to prevent clots in the legs. Coated Aspirin 325 mg daily with flights

## 2014-05-20 NOTE — Progress Notes (Signed)
   Subjective:    Patient ID: Luke Hunt, male    DOB: 09/21/1971, 43 y.o.   MRN: [id number]  HPI   He was seen in the emergency room 05/17/14 for chest pain, shortness breath, & swelling of legs in the context of [place] from [place] This was followed [place] 2 [place] and 2 drive from [place]. He described chest tightness as "bronchitis-like". He was having shortness of breath and nonproductive cough. The foot and ankle swelling began the evening of the ER visit.  Normal studies included chest x-ray, troponin, and d-dimer. EKG was interpreted as possibly showing lateral infarct. This was manifested as a Q wave in the inferior & lateral V. Leads (EKG personally reviewed). There were no associated ischemic changes.  He did not go for ultrasound of the lower extremities as the edema has resolved. He has some residual discomfort in the shins.  He now has developed a cough productive of yellow phlegm. He also has rhinitis with clear drainage. He describes pain in the left supra-orbital and maxillary areas.  Past medical history is positive for allergic rhinitis as well as obstructive sleep apnea. He is on CPAP. He does not smoke.      Review of Systems  He denies fever, chills, or sweats  He does have some extrinsic symptoms of itchy, watery eyes, and sneezing  He denies nasal purulence. He is not having sore throat or dental pain  He has pressure in ears without discharge.  No associated wheezing or shortness of breath with  cough. The cough does impair sleep.     Objective:   Physical Exam General appearance:good health ;well nourished; no acute distress or increased work of breathing is present.  No  lymphadenopathy about the head, neck, or axilla noted.   Eyes: No conjunctival inflammation or lid edema is present. There is no scleral icterus.  Ears:  External ear exam shows no significant lesions or deformities.  Otoscopic  examination reveals clear canals, tympanic membranes are intact bilaterally without bulging, retraction, inflammation or discharge.  Nose:  External nasal examination shows no deformity or inflammation. Nasal mucosa are erythematous & dry without lesions or exudates. Erythema  & slight edema of septum with decreased airflow.   Oral exam: Dental hygiene is good; lips and gums are healthy appearing.There is no oropharyngeal erythema or exudate noted.   Neck:  No deformities, thyromegaly, masses, or tenderness noted.   Supple with full range of motion without pain.   Heart:  Normal rate and regular rhythm. S1 and S2 normal without gallop, murmur, click, rub or other extra sounds.   Lungs:Chest clear to auscultation; no wheezes, rhonchi,rales ,or rubs present.No increased work of breathing.    Extremities:  No cyanosis, edema, or clubbing  noted .Homan's negative.   Skin: Warm & dry w/o jaundice or tenting.         Assessment & Plan:  #1 acute bronchitis w/o bronchospasm #2rhinitis #3 chest pain , dyspnea & edema in context of international travel ; all resolved See orders & AVS Plan: See orders and recommendations

## 2014-07-28 ENCOUNTER — Ambulatory Visit (INDEPENDENT_AMBULATORY_CARE_PROVIDER_SITE_OTHER): Payer: BC Managed Care – PPO | Admitting: Family Medicine

## 2014-07-28 ENCOUNTER — Encounter: Payer: Self-pay | Admitting: Family Medicine

## 2014-07-28 VITALS — BP 120/82 | HR 61 | Temp 97.7°F | Ht 74.0 in | Wt 271.0 lb

## 2014-07-28 DIAGNOSIS — J020 Streptococcal pharyngitis: Secondary | ICD-10-CM

## 2014-07-28 LAB — POCT RAPID STREP A (OFFICE): Rapid Strep A Screen: NEGATIVE

## 2014-07-28 NOTE — Progress Notes (Signed)
No chief complaint on file.   HPI:  -started: 2 days ago -symptoms:nasal congestion, sore throat, cough -but he has had strep in the past so worried about strep -denies:fever, SOB, NVD, tooth pain, sinus pain, rash -has tried: nothing -sick contacts/travel/risks: denies flu exposure, tick exposure, strep exposure or Ebola risks -Hx of: allergies ROS: See pertinent positives and negatives per HPI.  Past Medical History  Diagnosis Date  . ALLERGIC RHINITIS   . GERD   . HYPERLIPIDEMIA   . HYPERTENSION   . OBESITY   . OSA (obstructive sleep apnea) 05/23/2012    Past Surgical History  Procedure Laterality Date  . Appendectomy    . Rigth knee arthroscopy      @ [facility]    Family History  Problem Relation Age of Onset  . Stroke Other   . Diabetes Other   . Heart disease Other     History   Social History  . Marital Status: Married    Spouse Name: N/A    Number of Children: N/A  . Years of Education: N/A   Social History Main Topics  . Smoking status: Former Games developer  . Smokeless tobacco: None  . Alcohol Use: Yes  . Drug Use: No  . Sexual Activity: None   Other Topics Concern  . None   Social History Narrative  . None    No current outpatient prescriptions on file.  EXAM:  Filed Vitals:   07/28/14 1617  BP: 120/82  Pulse: 61  Temp: 97.7 F (36.5 C)    Body mass index is 34.78 kg/(m^2).  GENERAL: vitals reviewed and listed above, alert, oriented, appears well hydrated and in no acute distress  HEENT: atraumatic, conjunttiva clear, no obvious abnormalities on inspection of external nose and ears, normal appearance of ear canals and TMs, clear nasal congestion, mild post oropharyngeal erythema with PND, no tonsillar edema or exudate, no sinus TTP  NECK: no obvious masses on inspection  LUNGS: clear to auscultation bilaterally, no wheezes, rales or rhonchi, good air movement  CV: HRRR, no peripheral edema  MS: moves all extremities without  noticeable abnormality  PSYCH: pleasant and cooperative, no obvious depression or anxiety  ASSESSMENT AND PLAN:  Discussed the following assessment and plan:  Streptococcal sore throat - Plan: POC Rapid Strep A  -given HPI and exam findings today, a serious infection or illness is unlikely. We discussed potential etiologies, with VURI being most likely, and advised supportive care and monitoring. We discussed treatment side effects, likely course, antibiotic misuse, transmission, and signs of developing a serious illness. -of course, we advised to return or notify a doctor immediately if symptoms worsen or persist or new concerns arise.    Patient Instructions  -afrin twice daily for 3 days - then STOP  -strep culture pending and we will call you if this is positive  -nasocort and claritin daily for 21 days  -follow up as needed      Mossie Gilder R.

## 2014-07-28 NOTE — Patient Instructions (Signed)
-  afrin twice daily for 3 days - then STOP  -strep culture pending and we will call you if this is positive  -nasocort and claritin daily for 21 days  -follow up as needed

## 2014-07-28 NOTE — Progress Notes (Signed)
Pre visit review using our clinic review tool, if applicable. No additional management support is needed unless otherwise documented below in the visit note. 

## 2014-07-30 LAB — CULTURE, GROUP A STREP: Organism ID, Bacteria: NORMAL

## 2014-08-03 ENCOUNTER — Encounter: Payer: Self-pay | Admitting: Family Medicine

## 2014-08-03 ENCOUNTER — Ambulatory Visit (INDEPENDENT_AMBULATORY_CARE_PROVIDER_SITE_OTHER): Payer: BC Managed Care – PPO | Admitting: Family Medicine

## 2014-08-03 VITALS — BP 118/80 | HR 63 | Temp 98.3°F | Ht 74.0 in | Wt 270.0 lb

## 2014-08-03 DIAGNOSIS — R059 Cough, unspecified: Secondary | ICD-10-CM

## 2014-08-03 DIAGNOSIS — J310 Chronic rhinitis: Secondary | ICD-10-CM

## 2014-08-03 DIAGNOSIS — J329 Chronic sinusitis, unspecified: Secondary | ICD-10-CM

## 2014-08-03 DIAGNOSIS — R05 Cough: Secondary | ICD-10-CM

## 2014-08-03 MED ORDER — HYDROCODONE-HOMATROPINE 5-1.5 MG/5ML PO SYRP: 5.0000 mL | ORAL_SOLUTION | Freq: Three times a day (TID) | ORAL | Status: DC | PRN

## 2014-08-03 NOTE — Progress Notes (Signed)
Pre visit review using our clinic review tool, if applicable. No additional management support is needed unless otherwise documented below in the visit note. 

## 2014-08-03 NOTE — Progress Notes (Addendum)
No chief complaint on file.   HPI:  -started: 8 days ago -seen here 9/22 with signs and symptoms that indicated AR vs VURI -symptoms:nasal congestion, sore throat, cough - this all has resolved, now has just cough and wants cough medication -denies:fever, SOB, NVD, tooth pain -has tried: OTC products and tx from last visit -sick contacts/travel/risks: denies flu exposure, tick exposure or or Ebola risks -Hx of: allergies -he reports PCP not available and he is going out of town and wants abx  ROS: See pertinent positives and negatives per HPI.  Past Medical History  Diagnosis Date  . ALLERGIC RHINITIS   . GERD   . HYPERLIPIDEMIA   . HYPERTENSION   . OBESITY   . OSA (obstructive sleep apnea) 05/23/2012    Past Surgical History  Procedure Laterality Date  . Appendectomy    . Rigth knee arthroscopy      @ [facility]    Family History  Problem Relation Age of Onset  . Stroke Other   . Diabetes Other   . Heart disease Other     History   Social History  . Marital Status: Married    Spouse Name: N/A    Number of Children: N/A  . Years of Education: N/A   Social History Main Topics  . Smoking status: Former Games developer  . Smokeless tobacco: None  . Alcohol Use: Yes  . Drug Use: No  . Sexual Activity: None   Other Topics Concern  . None   Social History Narrative  . None    Current outpatient prescriptions:AMOXICILLIN PO, Take by mouth., Disp: , Rfl: ;  HYDROcodone-homatropine (HYCODAN) 5-1.5 MG/5ML syrup, Take 5 mLs by mouth every 8 (eight) hours as needed for cough., Disp: 120 mL, Rfl: 0  EXAM:  Filed Vitals:   08/03/14 1522  BP: 118/80  Pulse: 63  Temp: 98.3 F (36.8 C)  O2 sats: 97% on RA  Body mass index is 34.65 kg/(m^2).  GENERAL: vitals reviewed and listed above, alert, oriented, appears well hydrated and in no acute distress  HEENT: atraumatic, conjunttiva clear, no obvious abnormalities on inspection of external nose and ears, normal appearance  of ear canals and TMs, clear nasal congestion, mild post oropharyngeal erythema with PND, no tonsillar edema or exudate, no sinus TTP  NECK: no obvious masses on inspection  LUNGS: clear to auscultation bilaterally, no wheezes, rales or rhonchi, good air movement  CV: HRRR, no peripheral edema  MS: moves all extremities without noticeable abnormality  PSYCH: pleasant and cooperative, no obvious depression or anxiety  ASSESSMENT AND PLAN:  Discussed the following assessment and plan:  Cough - Plan: HYDROcodone-homatropine (HYCODAN) 5-1.5 MG/5ML syrup  Rhinosinusitis  -given HPI and exam findings today, a serious infection or illness is unlikely. We discussed potential etiologies, with VURI being most likely, and advised supportive care and monitoring. We discussed treatment side effects, likely course, antibiotic misuse, transmission, and signs of developing a serious illness. -we discussed prednisone for post viral cough, but after discussion of risks benefits opted not to do this -of course, we advised to return or notify a doctor immediately if symptoms worsen or persist or new concerns arise.    There are no Patient Instructions on file for this visit.   Kriste Basque R.

## 2014-08-03 NOTE — Patient Instructions (Signed)
-  As we discussed, we have prescribed a new medication for you at this appointment. We discussed the common and serious potential adverse effects of this medication and you can review these and more with the pharmacist when you pick up your medication.  Please follow the instructions for use carefully and notify us immediately if you have any problems taking this medication.  -follow up as needed and if worsening or not improving as expected

## 2014-08-04 ENCOUNTER — Ambulatory Visit: Payer: BC Managed Care – PPO | Admitting: Internal Medicine

## 2014-08-28 ENCOUNTER — Encounter: Payer: BC Managed Care – PPO | Admitting: Internal Medicine

## 2014-09-08 ENCOUNTER — Telehealth: Payer: Self-pay | Admitting: Internal Medicine

## 2014-09-08 NOTE — Telephone Encounter (Signed)
Ok. Need new pt visit.

## 2014-09-08 NOTE — Telephone Encounter (Signed)
Pt would like to switch to dr kim. Pt is having a hard time getting in with dr Jonny Ruizjohn

## 2014-09-09 NOTE — Telephone Encounter (Signed)
lmom for pt to sch appt °

## 2014-09-14 NOTE — Telephone Encounter (Signed)
lmom for pt to sh appt

## 2014-09-15 NOTE — Telephone Encounter (Signed)
Pt has been sch

## 2014-10-12 ENCOUNTER — Other Ambulatory Visit: Payer: Self-pay | Admitting: Internal Medicine

## 2014-10-27 ENCOUNTER — Encounter: Payer: Self-pay | Admitting: Family Medicine

## 2014-10-27 ENCOUNTER — Ambulatory Visit (INDEPENDENT_AMBULATORY_CARE_PROVIDER_SITE_OTHER): Payer: BC Managed Care – PPO | Admitting: Family Medicine

## 2014-10-27 VITALS — BP 120/80 | HR 68 | Temp 98.0°F | Ht 74.0 in | Wt 264.9 lb

## 2014-10-27 DIAGNOSIS — E785 Hyperlipidemia, unspecified: Secondary | ICD-10-CM

## 2014-10-27 DIAGNOSIS — I10 Essential (primary) hypertension: Secondary | ICD-10-CM

## 2014-10-27 DIAGNOSIS — K5900 Constipation, unspecified: Secondary | ICD-10-CM

## 2014-10-27 DIAGNOSIS — E669 Obesity, unspecified: Secondary | ICD-10-CM

## 2014-10-27 DIAGNOSIS — Z0000 Encounter for general adult medical examination without abnormal findings: Secondary | ICD-10-CM

## 2014-10-27 DIAGNOSIS — K219 Gastro-esophageal reflux disease without esophagitis: Secondary | ICD-10-CM

## 2014-10-27 DIAGNOSIS — G4733 Obstructive sleep apnea (adult) (pediatric): Secondary | ICD-10-CM

## 2014-10-27 NOTE — Progress Notes (Signed)
HPI:  Luke Hunt is here to establish care. Reports he is feeling well, transferring care as his daughter comes here. Wants his basic labs.   Has the following chronic problems that require follow up and concerns today:  HTN: -on losartan 50mg  -denies: CP, SOB, DOE, swelling and palpitations  GERD: -resolved with exercise and diet -denies: dysphagia  OSA: -on cpap -denies: SOB, DOE, somnolence  Chronic intermittent constipation: -for many years  Declined flu shot  Diet and exercise: reports works out and does heavy lifting and cardiovascular exercise on a regular basis; reports diet is an issue - does avoid red meat, but eats a lot of carbs and drinks too much per his report  ROS negative for unless reported above: fevers, unintentional weight loss, hearing or vision loss, chest pain, palpitations, struggling to breath, hemoptysis, melena, hematochezia, hematuria, falls, loc, si, thoughts of self harm  Past Medical History  Diagnosis Date  . ALLERGIC RHINITIS   . GERD   . HYPERLIPIDEMIA   . HYPERTENSION   . OBESITY   . OSA (obstructive sleep apnea) 05/23/2012  . Syncope     related to blood draws, stitches    Past Surgical History  Procedure Laterality Date  . Appendectomy    . Rigth knee arthroscopy      @ [facility]    Family History  Problem Relation Age of Onset  . Stroke Other   . Diabetes Other   . Heart disease Other     History   Social History  . Marital Status: Married    Spouse Name: N/A    Number of Children: N/A  . Years of Education: N/A   Social History Main Topics  . Smoking status: Former Games developermoker  . Smokeless tobacco: None  . Alcohol Use: Yes  . Drug Use: No  . Sexual Activity: None   Other Topics Concern  . None   Social History Narrative   Work or School: Oncologistpackaging company manager      Home Situation: lives with wife and daughter and son      Spiritual Beliefs: Christian      Lifestyle: regular exercise - heavy  lifter; diet poor          Current outpatient prescriptions: losartan (COZAAR) 50 MG tablet, TAKE 1 TABLET BY MOUTH DAILY, Disp: 30 tablet, Rfl: 5  EXAM:  Filed Vitals:   10/27/14 1613  BP: 120/80  Pulse: 68  Temp: 98 F (36.7 C)    Body mass index is 34 kg/(m^2).  GENERAL: vitals reviewed and listed above, alert, oriented, appears well hydrated and in no acute distress  HEENT: atraumatic, conjunttiva clear, no obvious abnormalities on inspection of external nose and ears  NECK: no obvious masses on inspection  LUNGS: clear to auscultation bilaterally, no wheezes, rales or rhonchi, good air movement  CV: HRRR, no peripheral edema  ABD: BS+, soft, NTTP  GU: declined  MS: moves all extremities without noticeable abnormality  PSYCH: pleasant and cooperative, no obvious depression or anxiety  ASSESSMENT AND PLAN:  Discussed the following assessment and plan:  Visit for preventive health examination  Essential hypertension - Plan: Basic metabolic panel -cont current treatment  Hyperlipemia - Plan: Lipid Panel  Gastroesophageal reflux disease, esophagitis presence not specified -resolved  Obesity - Plan: Hemoglobin A1c  OSA (obstructive sleep apnea)  Constipation, unspecified constipation type - chronic -advised healthy diet with increased fiber, daily fiber supplement mirilax prn and further eval if persists, worsens, blood in  stools or other concerns  -We reviewed the PMH, PSH, FH, SH, Meds and Allergies. -We provided refills for any medications we will prescribe as needed. -We addressed current concerns per orders and patient instructions. -We have asked for records for pertinent exams, studies, vaccines and notes from previous providers. -We have advised patient to follow up per instructions below.   -Patient advised to return or notify a doctor immediately if symptoms worsen or persist or new concerns arise.  Patient Instructions  BEFORE YOU  LEAVE: -schedule lab appointment for fasting labs  -We have ordered labs or studies at this visit. It can take up to 1-2 weeks for results and processing. We will contact you with instructions IF your results are abnormal. Normal results will be released to your [facility]. If you have not heard from us or can not find your results in [facility] in 2 weeks please contact our office.  -PLEASE SIGN UP FOR MYCHART TODAY   We recommend the following healthy lifestyle measures: - eat a healthy diet consisting of lots of vegetables, fruits, beans, nuts, seeds, healthy meats such as white chicken and fish and whole grains if carbs.  - avoid fried foods, fast food, processed foods, sodas, red meet and other fattening foods.  - get a least 150 minutes of aerobic exercise per week.       Kriste BasqueKIM, [name] Hunt.

## 2014-10-27 NOTE — Patient Instructions (Addendum)
BEFORE YOU LEAVE: -schedule lab appointment for fasting labs  -We have ordered labs or studies at this visit. It can take up to 1-2 weeks for results and processing. We will contact you with instructions IF your results are abnormal. Normal results will be released to your [facility]. If you have not heard from us or can not find your results in [facility] in 2 weeks please contact our office.  -PLEASE SIGN UP FOR MYCHART TODAY   We recommend the following healthy lifestyle measures: - eat a healthy diet consisting of lots of vegetables, fruits, beans, nuts, seeds, healthy meats such as white chicken and fish and whole grains if carbs.  - avoid fried foods, fast food, processed foods, sodas, red meet and other fattening foods.  - get a least 150 minutes of aerobic exercise per week.

## 2014-10-27 NOTE — Progress Notes (Signed)
Pre visit review using our clinic review tool, if applicable. No additional management support is needed unless otherwise documented below in the visit note. 

## 2014-10-28 ENCOUNTER — Telehealth: Payer: Self-pay | Admitting: Family Medicine

## 2014-10-28 NOTE — Telephone Encounter (Signed)
emmi emailed °

## 2014-11-05 ENCOUNTER — Other Ambulatory Visit (INDEPENDENT_AMBULATORY_CARE_PROVIDER_SITE_OTHER): Payer: BC Managed Care – PPO

## 2014-11-05 DIAGNOSIS — I10 Essential (primary) hypertension: Secondary | ICD-10-CM

## 2014-11-05 DIAGNOSIS — E785 Hyperlipidemia, unspecified: Secondary | ICD-10-CM

## 2014-11-05 DIAGNOSIS — E669 Obesity, unspecified: Secondary | ICD-10-CM

## 2014-11-05 LAB — BASIC METABOLIC PANEL
BUN: 19 mg/dL (ref 6–23)
CO2: 29 mEq/L (ref 19–32)
Calcium: 10.5 mg/dL (ref 8.4–10.5)
Chloride: 105 mEq/L (ref 96–112)
Creatinine, Ser: 1.2 mg/dL (ref 0.4–1.5)
GFR: 67.68 mL/min (ref >60.00)
Glucose, Bld: 110 mg/dL — ABNORMAL HIGH (ref 70–99)
Potassium: 4.9 mEq/L (ref 3.5–5.1)
Sodium: 139 mEq/L (ref 135–145)

## 2014-11-05 LAB — LIPID PANEL
Cholesterol: 272 mg/dL — ABNORMAL HIGH (ref 0–200)
HDL: 52.1 mg/dL (ref >39.00)
LDL Cholesterol: 186 mg/dL — ABNORMAL HIGH (ref 0–99)
NonHDL: 219.9
Total CHOL/HDL Ratio: 5
Triglycerides: 172 mg/dL — ABNORMAL HIGH (ref 0.0–149.0)
VLDL: 34.4 mg/dL (ref 0.0–40.0)

## 2014-11-05 LAB — HEMOGLOBIN A1C: Hgb A1c MFr Bld: 6.2 % (ref 4.6–6.5)

## 2014-12-03 ENCOUNTER — Encounter: Payer: Self-pay | Admitting: Family Medicine

## 2014-12-03 ENCOUNTER — Ambulatory Visit (INDEPENDENT_AMBULATORY_CARE_PROVIDER_SITE_OTHER): Payer: BLUE CROSS/BLUE SHIELD | Admitting: Family Medicine

## 2014-12-03 DIAGNOSIS — R300 Dysuria: Secondary | ICD-10-CM

## 2014-12-03 LAB — POCT URINALYSIS DIPSTICK
Bilirubin, UA: NEGATIVE
Glucose, UA: NEGATIVE
Ketones, UA: NEGATIVE
Leukocytes, UA: NEGATIVE
Nitrite, UA: NEGATIVE
Protein, UA: NEGATIVE
Spec Grav, UA: 1.01
Urobilinogen, UA: 0.2
pH, UA: 6.5

## 2014-12-03 LAB — URINALYSIS, MICROSCOPIC ONLY

## 2014-12-03 MED ORDER — DOXYCYCLINE HYCLATE 100 MG PO CAPS: 100.0000 mg | ORAL_CAPSULE | Freq: Two times a day (BID) | ORAL | Status: DC

## 2014-12-03 NOTE — Patient Instructions (Signed)

## 2014-12-03 NOTE — Progress Notes (Signed)
Pre visit review using our clinic review tool, if applicable. No additional management support is needed unless otherwise documented below in the visit note. 

## 2014-12-03 NOTE — Progress Notes (Signed)
   Subjective:    Patient ID: Luke Hunt, male    DOB: 01/14/1971, 44 y.o.   MRN: [id number]  HPI Acute visit. Dysuria for 2 days. Mild burning with urination and occasional frequency. No gross hematuria. No penile discharge. Denies any prior history of STD. He states his wife was just diagnosed with what sounds like acute cystitis. Denies any fever or chills. Denies any penile rash.  Past Medical History  Diagnosis Date  . ALLERGIC RHINITIS   . GERD   . HYPERLIPIDEMIA   . HYPERTENSION   . OBESITY   . OSA (obstructive sleep apnea) 05/23/2012  . Syncope     related to blood draws, stitches   Past Surgical History  Procedure Laterality Date  . Appendectomy    . Rigth knee arthroscopy      @ [facility]    reports that he has quit smoking. He does not have any smokeless tobacco history on file. He reports that he drinks alcohol. He reports that he does not use illicit drugs. family history includes Diabetes in his other; Heart disease in his other; Stroke in his other. No Known Allergies    Review of Systems  Constitutional: Negative for fever and chills.  Genitourinary: Positive for dysuria. Negative for frequency and hematuria.  Skin: Negative for rash.  Hematological: Negative for adenopathy.       Objective:   Physical Exam  Constitutional: He appears well-developed and well-nourished.  Cardiovascular: Normal rate and regular rhythm.   Pulmonary/Chest: Effort normal and breath sounds normal. No respiratory distress. He has no wheezes. He has no rales.          Assessment & Plan:  Dysuria. Doubt UTI. Urine dipstick reveals trace blood otherwise negative. No history of stones. Check urine micro-. Send urine probe for chlamydia and GC. Doubt infectious urethritis but cover with doxycycline 100 mg twice a day for 7 days pending chlamydia culture

## 2014-12-05 LAB — GC/CHLAMYDIA PROBE AMP, URINE
Chlamydia, Swab/Urine, PCR: NEGATIVE
GC Probe Amp, Urine: NEGATIVE

## 2014-12-07 ENCOUNTER — Telehealth: Payer: Self-pay

## 2014-12-07 NOTE — Telephone Encounter (Signed)
Pt states that he is still having dysuria. He does not think the medication is helping. Pt will take his last pill today.

## 2014-12-08 NOTE — Telephone Encounter (Signed)
His GC and chlamydia cultures were negative so should be OK to stop the Doxycycline.  If dysuria persists would consider having him see Urologist since there is no obvious infection.

## 2014-12-08 NOTE — Telephone Encounter (Signed)
Left message for patient to return call.

## 2014-12-09 NOTE — Telephone Encounter (Signed)
Pt informed

## 2014-12-11 ENCOUNTER — Encounter: Payer: Self-pay | Admitting: Family Medicine

## 2014-12-11 ENCOUNTER — Ambulatory Visit (INDEPENDENT_AMBULATORY_CARE_PROVIDER_SITE_OTHER): Payer: BLUE CROSS/BLUE SHIELD | Admitting: Family Medicine

## 2014-12-11 VITALS — BP 118/80 | HR 54 | Temp 98.0°F | Ht 74.0 in | Wt 266.5 lb

## 2014-12-11 DIAGNOSIS — M109 Gout, unspecified: Secondary | ICD-10-CM

## 2014-12-11 MED ORDER — NAPROXEN 500 MG PO TABS: 500.0000 mg | ORAL_TABLET | Freq: Two times a day (BID) | ORAL | Status: DC

## 2014-12-11 NOTE — Progress Notes (Signed)
Pre visit review using our clinic review tool, if applicable. No additional management support is needed unless otherwise documented below in the visit note. 

## 2014-12-11 NOTE — Patient Instructions (Signed)

## 2014-12-11 NOTE — Progress Notes (Signed)
HPI:  Gout: -sudden redness, swelling and pain in L great toe -FH gout in father -hx similar swelling in R great to that resolved with NSAIDs -this started suddenly 2 days ago - better with ibuprofen -denies: increasing redness and swelling, fevers, malaise  ROS: See pertinent positives and negatives per HPI.  Past Medical History  Diagnosis Date  . ALLERGIC RHINITIS   . GERD   . HYPERLIPIDEMIA   . HYPERTENSION   . OBESITY   . OSA (obstructive sleep apnea) 05/23/2012  . Syncope     related to blood draws, stitches    Past Surgical History  Procedure Laterality Date  . Appendectomy    . Rigth knee arthroscopy      @ [facility] [facility]    Family History  Problem Relation Age of Onset  . Stroke Other   . Diabetes Other   . Heart disease Other     History   Social History  . Marital Status: Married    Spouse Name: N/A    Number of Children: N/A  . Years of Education: N/A   Social History Main Topics  . Smoking status: Former Games developermoker  . Smokeless tobacco: None  . Alcohol Use: Yes  . Drug Use: No  . Sexual Activity: None   Other Topics Concern  . None   Social History Narrative   Work or School: Oncologistpackaging company manager      Home Situation: lives with wife and daughter and son      Spiritual Beliefs: Christian      Lifestyle: regular exercise - heavy lifter; diet poor           Current outpatient prescriptions:  .  losartan (COZAAR) 50 MG tablet, TAKE 1 TABLET BY MOUTH DAILY, Disp: 30 tablet, Rfl: 5 .  Omega-3 Fatty Acids (FISH OIL PO), Take by mouth., Disp: , Rfl:  .  naproxen (NAPROSYN) 500 MG tablet, Take 1 tablet (500 mg total) by mouth 2 (two) times daily with a meal., Disp: 30 tablet, Rfl: 1  EXAM:  Filed Vitals:   12/11/14 0908  BP: 118/80  Pulse: 54  Temp: 98 F (36.7 C)    Body mass index is 34.2 kg/(m^2).  GENERAL: vitals reviewed and listed above, alert, oriented, appears well hydrated and in no acute distress  HEENT: atraumatic,  conjunttiva clear, no obvious abnormalities on inspection of external nose and ears  NECK: no obvious masses on inspection  LUNGS: clear to auscultation bilaterally, no wheezes, rales or rhonchi, good air movement  CV: HRRR, no peripheral edema  MS: moves all extremities without noticeable abnormality Erythema, tenderness, swelling L first great toe  PSYCH: pleasant and cooperative, no obvious depression or anxiety  ASSESSMENT AND PLAN:  Discussed the following assessment and plan:  Gout of left foot, unspecified cause, unspecified chronicity - Plan: Uric Acid, naproxen (NAPROSYN) 500 MG tablet  -we discussed possible serious and likely etiologies, workup and treatment, treatment risks and return precautions - likely gout given hx, opted for NSAIDs since responding well to this and no contraidications -after this discussion, Judie GrieveBryan opted for nsaids, check uric aacid in 2 weeks, follow up with ortho if worsening or not improving for tap -of course, we advised Judie GrieveBryan  to return or notify a doctor immediately if symptoms worsen or persist or new concerns arise.  .  -Patient advised to return or notify a doctor immediately if symptoms worsen or persist or new concerns arise.  There are no Patient Instructions on file  for this visit.   Colin Benton R.

## 2015-01-05 ENCOUNTER — Ambulatory Visit (INDEPENDENT_AMBULATORY_CARE_PROVIDER_SITE_OTHER): Payer: BLUE CROSS/BLUE SHIELD | Admitting: Family Medicine

## 2015-01-05 ENCOUNTER — Encounter: Payer: Self-pay | Admitting: Family Medicine

## 2015-01-05 VITALS — BP 122/86 | HR 67 | Temp 98.3°F | Ht 74.0 in | Wt 266.4 lb

## 2015-01-05 DIAGNOSIS — M109 Gout, unspecified: Secondary | ICD-10-CM

## 2015-01-05 DIAGNOSIS — E785 Hyperlipidemia, unspecified: Secondary | ICD-10-CM

## 2015-01-05 DIAGNOSIS — I10 Essential (primary) hypertension: Secondary | ICD-10-CM

## 2015-01-05 DIAGNOSIS — E669 Obesity, unspecified: Secondary | ICD-10-CM

## 2015-01-05 LAB — LIPID PANEL
Cholesterol: 199 mg/dL (ref 0–200)
HDL: 50.3 mg/dL (ref >39.00)
LDL Cholesterol: 132 mg/dL — ABNORMAL HIGH (ref 0–99)
NonHDL: 148.7
Total CHOL/HDL Ratio: 4
Triglycerides: 86 mg/dL (ref 0.0–149.0)
VLDL: 17.2 mg/dL (ref 0.0–40.0)

## 2015-01-05 LAB — URIC ACID: Uric Acid, Serum: 6.8 mg/dL (ref 4.0–7.8)

## 2015-01-05 NOTE — Progress Notes (Signed)
Pre visit review using our clinic review tool, if applicable. No additional management support is needed unless otherwise documented below in the visit note. 

## 2015-01-05 NOTE — Progress Notes (Signed)
HPI:  HLD: -new dx -advised healthy diet, regular CV exercise and this follow up to discuss medications -reports: no changes in diet and poor with exercise today -denies: skin changes, nausea, vomiting, hx statin intolerance -he is really against taking a medication he wants to recheck lipids today as he feel slab was eschewed due to a really poor diet a the time   Prediabetes: -advised healthy diet and regular exercise -denies: polyuria, polydipsia, vision changes  HTN: -on losartan 50mg  -denies: CP, SOB, DOE, swelling and palpitations  GERD: -resolved with exercise and diet -denies: dysphagia  OSA: -on cpap -denies: SOB, DOE, somnolence  Chronic intermittent constipation: -for many years -denies: change in bowel, blood in stools, weight loss  Gout: -2 episodes of acute swelling and pain of MCP jts in thepast -resolved with NSAIDs -last flare 12/2014 -denies: swelling of jts, pain in joints today  Obesity: -is a heavy lifter at the gym, poor diet - but not exercising lately   ROS: See pertinent positives and negatives per HPI.  Past Medical History  Diagnosis Date  . ALLERGIC RHINITIS   . GERD   . HYPERLIPIDEMIA   . HYPERTENSION   . OBESITY   . OSA (obstructive sleep apnea) 05/23/2012  . Syncope     related to blood draws, stitches    Past Surgical History  Procedure Laterality Date  . Appendectomy    . Rigth knee arthroscopy      @ [facility] [facility]    Family History  Problem Relation Age of Onset  . Stroke Other   . Diabetes Other   . Heart disease Other     History   Social History  . Marital Status: Married    Spouse Name: N/A  . Number of Children: N/A  . Years of Education: N/A   Social History Main Topics  . Smoking status: Former Games developermoker  . Smokeless tobacco: Not on file  . Alcohol Use: Yes  . Drug Use: No  . Sexual Activity: Not on file   Other Topics Concern  . 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  losartan (COZAAR) 50 MG tablet, TAKE 1 TABLET BY MOUTH DAILY, Disp: 30 tablet, Rfl: 5 .  Omega-3 Fatty Acids (FISH OIL PO), Take by mouth., Disp: , Rfl:   EXAM:  Filed Vitals:   01/05/15 0859  BP: 122/86  Pulse: 67  Temp: 98.3 F (36.8 C)    Body mass index is 34.19 kg/(m^2).  GENERAL: vitals reviewed and listed above, alert, oriented, appears well hydrated and in no acute distress  HEENT: atraumatic, conjunttiva clear, no obvious abnormalities on inspection of external nose and ears  NECK: no obvious masses on inspection  LUNGS: clear to auscultation bilaterally, no wheezes, rales or rhonchi, good air movement  CV: HRRR, no peripheral edema  MS: moves all extremities without noticeable abnormality  PSYCH: pleasant and cooperative, no obvious depression or anxiety  ASSESSMENT AND PLAN:  Discussed the following assessment and plan:  Hyperlipemia - Plan: Lipid Panel  Obesity  Essential hypertension  Gout without tophus, unspecified cause, unspecified chronicity, unspecified site - Plan: Uric Acid  -lifestyle recs discussed at length -advised  statin and discussed risks - he declined and wants to do lifestyle for three months; he wants to recheck lipids today and does not care about cost -follow up 3 months -Patient advised to return or notify a doctor immediately if symptoms worsen or persist or new concerns arise.  There are no Patient Instructions on file for this visit.   Kriste Basque R.

## 2015-01-06 ENCOUNTER — Telehealth: Payer: Self-pay | Admitting: Family Medicine

## 2015-01-06 NOTE — Telephone Encounter (Signed)
emmi emailed °

## 2015-02-18 ENCOUNTER — Other Ambulatory Visit: Payer: Self-pay | Admitting: Internal Medicine

## 2015-04-07 ENCOUNTER — Ambulatory Visit: Payer: BLUE CROSS/BLUE SHIELD | Admitting: Family Medicine

## 2015-04-29 ENCOUNTER — Ambulatory Visit (INDEPENDENT_AMBULATORY_CARE_PROVIDER_SITE_OTHER): Payer: Self-pay | Admitting: Family Medicine

## 2015-04-29 ENCOUNTER — Encounter: Payer: Self-pay | Admitting: Family Medicine

## 2015-04-29 DIAGNOSIS — R69 Illness, unspecified: Secondary | ICD-10-CM

## 2015-04-29 NOTE — Progress Notes (Signed)
NO SHOW  Pre-vist review of chart: HLD: -new dx -advised healthy diet, regular CV exercise and  Statin -reports: no changes in diet and poor with exercise today -denies: skin changes, nausea, vomiting, hx statin intolerance -he is really against taking a medication he wants to recheck lipids   Prediabetes: -advised healthy diet and regular exercise -denies: polyuria, polydipsia, vision changes  HTN: -on losartan 50mg  -denies: CP, SOB, DOE, swelling and palpitations  OSA: -on cpap -denies: SOB, DOE, somnolence  Obesity: -is a heavy lifter at the gym, poor diet - but not exercising lately

## 2015-08-24 ENCOUNTER — Encounter: Payer: Self-pay | Admitting: Adult Health

## 2015-08-24 ENCOUNTER — Ambulatory Visit (INDEPENDENT_AMBULATORY_CARE_PROVIDER_SITE_OTHER): Payer: BLUE CROSS/BLUE SHIELD | Admitting: Adult Health

## 2015-08-24 VITALS — BP 120/82 | HR 66 | Temp 98.6°F | Ht 74.0 in | Wt 269.0 lb

## 2015-08-24 DIAGNOSIS — J029 Acute pharyngitis, unspecified: Secondary | ICD-10-CM

## 2015-08-24 LAB — POCT RAPID STREP A (OFFICE): Rapid Strep A Screen: NEGATIVE

## 2015-08-24 MED ORDER — MAGIC MOUTHWASH W/LIDOCAINE
5.0000 mL | Freq: Four times a day (QID) | ORAL | Status: DC | PRN
Start: 2015-08-24 — End: 2016-02-16

## 2015-08-24 NOTE — Progress Notes (Signed)
  Brantley StageBryan T. Blitzer is a 44 y.o. male presenting with a sore throat for 3-4 days.  Associated symptoms include:  fever, chills, headache, ear fullness and muscle aches.  Symptoms are constant.  Home treatment thus far includes:  rest, hydration, NSAIDS/acetaminophen and lozenges.  Known sick contacts with similar symptoms.(wife had sore throat last week but was not strep)  There is no history of of similar symptoms.  Exam:  BP 120/82 mmHg  Pulse 66  Temp(Src) 98.6 F (37 C) (Oral)  Ht 6\' 2"  (1.88 m)  Wt 269 lb (122.018 kg)  BMI 34.52 kg/m2  Constitutional; Alert and oriented. Does not appear in any acute distress HEENT: TM's visualized. Tonsils red and swollen without exudate.  Neck: Swollen lymph nodes (submandibular) Heart Normal rate and rhythm. No murmurs, gallops or rubs Lungs: Clear to auscultation in full fields Skin: warm and dry  Centor Score 3

## 2015-08-24 NOTE — Progress Notes (Signed)
Pre visit review using our clinic review tool, if applicable. No additional management support is needed unless otherwise documented below in the visit note. 

## 2015-08-24 NOTE — Patient Instructions (Addendum)
Sore Throat A sore throat is pain, burning, irritation, or scratchiness of the throat. There is often pain or tenderness when swallowing or talking. A sore throat may be accompanied by other symptoms, such as coughing, sneezing, fever, and swollen neck glands. A sore throat is often the first sign of another sickness, such as a cold, flu, strep throat, or mononucleosis (commonly known as mono). Most sore throats go away without medical treatment. CAUSES  The most common causes of a sore throat include:  A viral infection, such as a cold, flu, or mono.  A bacterial infection, such as strep throat, tonsillitis, or whooping cough.  Seasonal allergies.  Dryness in the air.  Irritants, such as smoke or pollution.  Gastroesophageal reflux disease (GERD). HOME CARE INSTRUCTIONS   Only take over-the-counter medicines as directed by your caregiver.  Drink enough fluids to keep your urine clear or pale yellow.  Rest as needed.  Try using throat sprays, lozenges, or sucking on hard candy to ease any pain (if older than 4 years or as directed).  Sip warm liquids, such as broth, herbal tea, or warm water with honey to relieve pain temporarily. You may also eat or drink cold or frozen liquids such as frozen ice pops.  Gargle with salt water (mix 1 tsp salt with 8 oz of water).  Do not smoke and avoid secondhand smoke.  Put a cool-mist humidifier in your bedroom at night to moisten the air. You can also turn on a hot shower and sit in the bathroom with the door closed for 5-10 minutes. SEEK IMMEDIATE MEDICAL CARE IF:  You have difficulty breathing.  You are unable to swallow fluids, soft foods, or your saliva.  You have increased swelling in the throat.  Your sore throat does not get better in 7 days.  You have nausea and vomiting.  You have a fever or persistent symptoms for more than 2-3 days.  You have a fever and your symptoms suddenly get worse. MAKE SURE YOU:   Understand  these instructions.  Will watch your condition.  Will get help right away if you are not doing well or get worse.   This information is not intended to replace advice given to you by your health care provider. Make sure you discuss any questions you have with your health care provider.   Document Released: 11/30/2004 Document Revised: 11/13/2014 Document Reviewed: 06/30/2012 Elsevier Interactive Patient Education 2016 Elsevier Inc.  Sore Throat A sore throat is pain, burning, irritation, or scratchiness of the throat. There is often pain or tenderness when swallowing or talking. A sore throat may be accompanied by other symptoms, such as coughing, sneezing, fever, and swollen neck glands. A sore throat is often the first sign of another sickness, such as a cold, flu, strep throat, or mononucleosis (commonly known as mono). Most sore throats go away without medical treatment. CAUSES  The most common causes of a sore throat include:  A viral infection, such as a cold, flu, or mono.  A bacterial infection, such as strep throat, tonsillitis, or whooping cough.  Seasonal allergies.  Dryness in the air.  Irritants, such as smoke or pollution.  Gastroesophageal reflux disease (GERD). HOME CARE INSTRUCTIONS   Only take over-the-counter medicines as directed by your caregiver.  Drink enough fluids to keep your urine clear or pale yellow.  Rest as needed.  Try using throat sprays, lozenges, or sucking on hard candy to ease any pain (if older than 4 years or as  directed).  Sip warm liquids, such as broth, herbal tea, or warm water with honey to relieve pain temporarily. You may also eat or drink cold or frozen liquids such as frozen ice pops.  Gargle with salt water (mix 1 tsp salt with 8 oz of water).  Do not smoke and avoid secondhand smoke.  Put a cool-mist humidifier in your bedroom at night to moisten the air. You can also turn on a hot shower and sit in the bathroom with the  door closed for 5-10 minutes. SEEK IMMEDIATE MEDICAL CARE IF:  You have difficulty breathing.  You are unable to swallow fluids, soft foods, or your saliva.  You have increased swelling in the throat.  Your sore throat does not get better in 7 days.  You have nausea and vomiting.  You have a fever or persistent symptoms for more than 2-3 days.  You have a fever and your symptoms suddenly get worse. MAKE SURE YOU:   Understand these instructions.  Will watch your condition.  Will get help right away if you are not doing well or get worse.   This information is not intended to replace advice given to you by your health care provider. Make sure you discuss any questions you have with your health care provider.   Document Released: 11/30/2004 Document Revised: 11/13/2014 Document Reviewed: 06/30/2012 Elsevier Interactive Patient Education [employer]

## 2015-08-26 LAB — CULTURE, GROUP A STREP: Organism ID, Bacteria: NORMAL

## 2015-08-31 ENCOUNTER — Ambulatory Visit (INDEPENDENT_AMBULATORY_CARE_PROVIDER_SITE_OTHER): Payer: BLUE CROSS/BLUE SHIELD | Admitting: Family Medicine

## 2015-08-31 ENCOUNTER — Encounter: Payer: Self-pay | Admitting: Family Medicine

## 2015-08-31 VITALS — BP 122/86 | HR 67 | Temp 97.8°F | Ht 74.0 in | Wt 267.1 lb

## 2015-08-31 DIAGNOSIS — R1031 Right lower quadrant pain: Secondary | ICD-10-CM

## 2015-08-31 NOTE — Progress Notes (Signed)
HPI: Acute visit for:  ? Hernia or strain: -felt "twinge" in LLQ when pressed heavier then usual weights a few days ago, noticed pain or discomfort in this region with certain movements a few times since -denies: bulge, persistent pain, bowel changes, dysuria, fevers, malaise  ROS: See pertinent positives and negatives per HPI.  Past Medical History  Diagnosis Date  . Hypertension   . GERD   . Hyperlipemia   . Obesity   . Gout   . OSA (obstructive sleep apnea) 05/23/2012    on CPAP  . Syncope     related to blood draws, stitches  . Environmental allergies   . Chronic constipation     intermittent    Past Surgical History  Procedure Laterality Date  . Appendectomy    . Rigth knee arthroscopy      @ [facility] [facility]    Family History  Problem Relation Age of Onset  . Stroke Other   . Diabetes Other   . Heart disease Other     Social History   Social History  . Marital Status: Married    Spouse Name: N/A  . Number of Children: N/A  . Years of Education: N/A   Social History Main Topics  . Smoking status: Former Games developermoker  . Smokeless tobacco: None  . Alcohol Use: Yes  . Drug Use: No  . Sexual Activity: Not Asked   Other Topics Concern  . None   Social History Narrative   Work or School: Oncologistpackaging company manager      Home Situation: lives with wife and daughter and son      Spiritual Beliefs: Christian      Lifestyle: regular exercise - heavy lifter; diet poor           Current outpatient prescriptions:  .  losartan (COZAAR) 50 MG tablet, TAKE 1 TABLET BY MOUTH DAILY, Disp: 30 tablet, Rfl: 3 .  magic mouthwash w/lidocaine SOLN, Take 5 mLs by mouth 4 (four) times daily as needed for mouth pain. Dukes Magic Mouthwash.   Gargle and spit, Disp: 180 mL, Rfl: 0 .  Omega-3 Fatty Acids (FISH OIL PO), Take by mouth., Disp: , Rfl:   EXAM:  Filed Vitals:   08/31/15 1347  BP: 122/86  Pulse: 67  Temp: 97.8 F (36.6 C)    Body mass index is 34.28  kg/(m^2).  GENERAL: vitals reviewed and listed above, alert, oriented, appears well hydrated and in no acute distress  HEENT: atraumatic, conjunttiva clear, no obvious abnormalities on inspection of external nose and ears  NECK: no obvious masses on inspection  LUNGS: clear to auscultation bilaterally, no wheezes, rales or rhonchi, good air movement  ABD: BS+, soft, NTTP except for minimal TTP in abd wall muscles RLQ with superficial palpation when these muscles are activated, no defect or hernia appreciated  CV: HRRR, no peripheral edema  MS: moves all extremities without noticeable abnormality  PSYCH: pleasant and cooperative, no obvious depression or anxiety  ASSESSMENT AND PLAN:  Discussed the following assessment and plan:  Right lower quadrant abdominal pain  -we discussed possible serious and likely etiologies, workup and treatment, treatment risks and return precautions -suspect minor muscle strain, do not appreciate hernia though discuss possibility of small hernia and diagnostic and tx options -opted for modification of activities, gradual return to activities and observation -he declined AVS -Patient advised to return or notify a doctor immediately if symptoms worsen or persist or new concerns arise.  There are no Patient  Instructions on file for this visit.   Colin Benton R.

## 2015-08-31 NOTE — Progress Notes (Signed)
Pre visit review using our clinic review tool, if applicable. No additional management support is needed unless otherwise documented below in the visit note. 

## 2015-09-11 ENCOUNTER — Other Ambulatory Visit: Payer: Self-pay | Admitting: Family Medicine

## 2015-09-23 ENCOUNTER — Other Ambulatory Visit: Payer: Self-pay

## 2015-09-23 MED ORDER — LOSARTAN POTASSIUM 50 MG PO TABS: 50.0000 mg | ORAL_TABLET | Freq: Every day | ORAL | Status: DC

## 2015-12-30 ENCOUNTER — Telehealth: Payer: Self-pay | Admitting: Family Medicine

## 2015-12-30 NOTE — Telephone Encounter (Signed)
I called the pt and informed him of the message below and he stated he will check his schedule and call back tomorrow for an appt.

## 2015-12-30 NOTE — Telephone Encounter (Signed)
We have not seen this patient in several months. Could you please help him to schedule an appointment as it appears he is due for a follow-up and we can place any needed referrals then after evaluation. Thank you.

## 2015-12-30 NOTE — Telephone Encounter (Signed)
Patient would like to be referred to [facility] [phone]

## 2016-01-07 ENCOUNTER — Ambulatory Visit (INDEPENDENT_AMBULATORY_CARE_PROVIDER_SITE_OTHER): Payer: BLUE CROSS/BLUE SHIELD | Admitting: Family Medicine

## 2016-01-07 ENCOUNTER — Encounter: Payer: Self-pay | Admitting: Family Medicine

## 2016-01-07 VITALS — BP 130/84 | HR 76 | Temp 99.0°F | Ht 74.0 in | Wt 265.0 lb

## 2016-01-07 DIAGNOSIS — E785 Hyperlipidemia, unspecified: Secondary | ICD-10-CM

## 2016-01-07 DIAGNOSIS — L989 Disorder of the skin and subcutaneous tissue, unspecified: Secondary | ICD-10-CM

## 2016-01-07 DIAGNOSIS — R194 Change in bowel habit: Secondary | ICD-10-CM | POA: Diagnosis not present

## 2016-01-07 DIAGNOSIS — R195 Other fecal abnormalities: Secondary | ICD-10-CM

## 2016-01-07 DIAGNOSIS — I10 Essential (primary) hypertension: Secondary | ICD-10-CM

## 2016-01-07 DIAGNOSIS — K5900 Constipation, unspecified: Secondary | ICD-10-CM | POA: Diagnosis not present

## 2016-01-07 MED ORDER — DOXYCYCLINE HYCLATE 100 MG PO TABS: 100.0000 mg | ORAL_TABLET | Freq: Two times a day (BID) | ORAL | Status: DC

## 2016-01-07 MED ORDER — HYDROCORTISONE 2.5 % RE CREA: 1.0000 "application " | TOPICAL_CREAM | Freq: Two times a day (BID) | RECTAL | Status: DC

## 2016-01-07 NOTE — Progress Notes (Signed)
Pre visit review using our clinic review tool, if applicable. No additional management support is needed unless otherwise documented below in the visit note. 

## 2016-01-07 NOTE — Patient Instructions (Signed)
Before you leave: -Schedule follow-up in 2 weeks for the skin lesion  For the constipation: -Fiber supplement such as Metamucil or Citrucel daily in the morning and water prior to breakfast -MiraLAX daily for 5-7 days at the first signs of being stopped up -Align probiotic -We placed a referral for you as discussed to the gastroenterologist. It usually takes about 1-2 weeks to process and schedule this referral. If you have not heard from us regarding this appointment in 2 weeks please contact our office.   For the skin lesion: -Please take the antibiotic as prescribed -Hydrocortisone cream as needed for itching -Follow up in 2 weeks to recheck, sooner if worsening or new symptoms

## 2016-01-07 NOTE — Progress Notes (Signed)
HPI:  Luke Hunt is a pleasant 45 yo with a PMH significant for obesity, HTN, HLD, GERD and constant constipation here for an acute visit for several issues:  #1) Change in bowel habits: -hx of chronic intermittent constipation for at least 5 years -reports worsening of this over the last year with change in diameter of stools to small flat "shoe string" stools -report he has to use a suppository to have BMs -will go 4-5 days without BM, then has straining and feels back up and uses suppositories, fiber and stool softeners -also reports hx of hemorrhoids, wants a prescription hemorrhoid cream -denies: fevers, abd pain, malaise, unexplained weight loss, diarrhea, melena, blood on or in stools, nausea, vomiting or fatigue  #2)Skin lesion: -L thigh -started our as small "pimple", he thought it was an infected hair follicle so treated with sugar, undiluted clorox and hydrogen peroxide -it worsened to a patch of "ulcer" and now seems to be healing some; was painful, now more itchy -denies pus, fevers, malaise  ROS: See pertinent positives and negatives per HPI.  Past Medical History  Diagnosis Date  . Hypertension   . GERD   . Hyperlipemia   . Obesity   . Gout   . OSA (obstructive sleep apnea) 05/23/2012    on CPAP  . Syncope     related to blood draws, stitches  . Environmental allergies   . Chronic constipation     intermittent    Past Surgical History  Procedure Laterality Date  . Appendectomy    . Rigth knee arthroscopy      @ [place] [facility]    Family History  Problem Relation Age of Onset  . Stroke Other   . Diabetes Other   . Heart disease Other     Social History   Social History  . Marital Status: Married    Spouse Name: N/A  . Number of Children: N/A  . Years of Education: N/A   Social History Main Topics  . Smoking status: Former Games developer  . Smokeless tobacco: None  . Alcohol Use: Yes  . Drug Use: No  . Sexual Activity: Not Asked   Other Topics  Concern  . 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  losartan (COZAAR) 50 MG tablet, Take 1 tablet (50 mg total) by mouth daily., Disp: 90 tablet, Rfl: 3 .  magic mouthwash w/lidocaine SOLN, Take 5 mLs by mouth 4 (four) times daily as needed for mouth pain. Dukes Magic Mouthwash.   Gargle and spit, Disp: 180 mL, Rfl: 0 .  NON FORMULARY, [employer], Disp: , Rfl:  .  Omega-3 Fatty Acids (FISH OIL PO), Take by mouth., Disp: , Rfl:  .  doxycycline (VIBRA-TABS) 100 MG tablet, Take 1 tablet (100 mg total) by mouth 2 (two) times daily., Disp: 14 tablet, Rfl: 0 .  hydrocortisone (ANUSOL-HC) 2.5 % rectal cream, Place 1 application rectally 2 (two) times daily., Disp: 30 g, Rfl: 0  EXAM:  Filed Vitals:   01/07/16 1539  BP: 130/84  Pulse: 76  Temp: 99 F (37.2 C)    Body mass index is 34.01 kg/(m^2).  GENERAL: vitals reviewed and listed  above, alert, oriented, appears well hydrated and in no acute distress  HEENT: atraumatic, conjunttiva clear, no obvious abnormalities on inspection of external nose and ears  NECK: no obvious masses on inspection  LUNGS: clear to auscultation bilaterally, no wheezes, rales or rhonchi, good air movement  CV: HRRR, no peripheral edema  ABD: BS+, soft, NTTP  RECTUM: deferred  SKIN: 2 cm in diameter raised erythematous patch with overlying scab on L lateral thigh, mild induration, no fluctuance appreciated  MS: moves all extremities without noticeable abnormality  PSYCH: pleasant and cooperative, no obvious depression or anxiety  ASSESSMENT AND PLAN:  Discussed the following assessment and plan:  Constipation, unspecified constipation type - Plan: Ambulatory referral to Gastroenterology Change in stool  caliber - Plan: Ambulatory referral to Gastroenterology -suspect IBS most likey, but change in stool caliber warrants GI evaluation with colonoscopy, he opted to defer rectal exam to GI. Will add TSH, cbc, CMP and celiac testing to next set of labs. Daily fiber supplement, mirilax and anusol in interim.  Skin lesion -suspect furuncle with cellulitis most likely - difficult to assess given harsh home treatments could have caused some irritation and local reaction -opted to treat with abx with follow up in 2 weeks to recheck.  Needs routine fasting lab work and labs listed above.  -Patient advised to return or notify a doctor immediately if symptoms worsen or persist or new concerns arise.  Patient Instructions  Before you leave: -Schedule follow-up in 2 weeks for the skin lesion  For the constipation: -Fiber supplement such as Metamucil or Citrucel daily in the morning and water prior to breakfast -MiraLAX daily for 5-7 days at the first signs of being stopped up -Align probiotic -We placed a referral for you as discussed to the gastroenterologist. It usually takes about 1-2 weeks to process and schedule this referral. If you have not heard from us regarding this appointment in 2 weeks please contact our office.   For the skin lesion: -Please take the antibiotic as prescribed -Hydrocortisone cream as needed for itching -Follow up in 2 weeks to recheck, sooner if worsening or new symptoms     KIM, HANNAH R.

## 2016-01-11 ENCOUNTER — Encounter: Payer: Self-pay | Admitting: Gastroenterology

## 2016-01-12 ENCOUNTER — Telehealth: Payer: Self-pay | Admitting: *Deleted

## 2016-01-12 NOTE — Telephone Encounter (Signed)
I called the pt and informed him of the message below and he stated he will call back for an appt.

## 2016-01-12 NOTE — Telephone Encounter (Signed)
-----   Message from Terressa KoyanagiHannah R Kim, DO sent at 01/07/2016  8:59 PM EST ----- Ronnald CollumJo Anne,   Can you please call Judie GrieveBryan. Looks like he did not schedule 2 week follow up and he is going to need fasting labs. He can come fasting that day for his follow up or can do them next week at a lab only visit if he prefers. Can you help him set this up? Thanks!

## 2016-01-19 ENCOUNTER — Telehealth: Payer: Self-pay | Admitting: Family Medicine

## 2016-01-19 NOTE — Telephone Encounter (Signed)
Pt states he has finished his ABX and his issue has not cleared up as much as he would have liked. Pt states he was to supposed to follow up in 2 weeks, but Dr Selena BattenKim is out out next week. Pt doesn't want to wait until his appointment  on 3/30. Would like an ABX.  [facility] [address]  Pt aware dr Selena Battenkim isout this afternoon and will return on Thursday am.

## 2016-01-20 NOTE — Telephone Encounter (Signed)
I called the pt and informed him of the message below and he stated he is not able to come in today-appt scheduled for Friday.

## 2016-01-20 NOTE — Telephone Encounter (Signed)
Please call to assist him, needs appt. Can see me today or tomorrow or when I return or another provider if he prefers while I am out. He needs labs too.

## 2016-01-21 ENCOUNTER — Encounter: Payer: Self-pay | Admitting: Family Medicine

## 2016-01-21 ENCOUNTER — Ambulatory Visit (INDEPENDENT_AMBULATORY_CARE_PROVIDER_SITE_OTHER): Payer: BLUE CROSS/BLUE SHIELD | Admitting: Family Medicine

## 2016-01-21 VITALS — BP 116/82 | HR 66 | Temp 98.5°F | Ht 74.0 in | Wt 277.9 lb

## 2016-01-21 DIAGNOSIS — R21 Rash and other nonspecific skin eruption: Secondary | ICD-10-CM

## 2016-01-21 NOTE — Patient Instructions (Signed)
Please cancel any follow-up that is currently scheduled and schedule a follow-up appointment in 4 weeks.  Apply clotrimazole antifungal cream twice daily for 4 weeks or until lesion is gone for 5 days.  Call in 2 weeks to let us know how this is going, call sooner if the rash is worsening or you have new symptoms.

## 2016-01-21 NOTE — Progress Notes (Signed)
Pre visit review using our clinic review tool, if applicable. No additional management support is needed unless otherwise documented below in the visit note. 

## 2016-01-21 NOTE — Progress Notes (Signed)
HPI:  Luke Hunt is a pleasant 45 year old here for a follow-up visit regarding a skin rash. This started several weeks ago as a possible infected hair follicle and improved but now has itchy patch and another nearby itchy patch of skin. No fevers, malaise, prior skin rash or other rashes.   ROS: See pertinent positives and negatives per HPI.  Past Medical History  Diagnosis Date  . Hypertension   . GERD   . Hyperlipemia   . Obesity   . Gout   . OSA (obstructive sleep apnea) 05/23/2012    on CPAP  . Syncope     related to blood draws, stitches  . Environmental allergies   . Chronic constipation     intermittent    Past Surgical History  Procedure Laterality Date  . Appendectomy    . Rigth knee arthroscopy      @ [facility] [facility]    Family History  Problem Relation Age of Onset  . Stroke Other   . Diabetes Other   . Heart disease Other     Social History   Social History  . Marital Status: Married    Spouse Name: N/A  . Number of Children: N/A  . Years of Education: N/A   Social History Main Topics  . Smoking status: Former Games developermoker  . Smokeless tobacco: None  . Alcohol Use: Yes  . Drug Use: No  . Sexual Activity: Not Asked   Other Topics Concern  . None   Social History Narrative   Work or School: Oncologistpackaging company manager      Home Situation: lives with wife and daughter and son      Spiritual Beliefs: Christian      Lifestyle: regular exercise - heavy lifter; diet poor           Current outpatient prescriptions:  .  doxycycline (VIBRA-TABS) 100 MG tablet, Take 1 tablet (100 mg total) by mouth 2 (two) times daily., Disp: 14 tablet, Rfl: 0 .  hydrocortisone (ANUSOL-HC) 2.5 % rectal cream, Place 1 application rectally 2 (two) times daily., Disp: 30 g, Rfl: 0 .  losartan (COZAAR) 50 MG tablet, Take 1 tablet (50 mg total) by mouth daily., Disp: 90 tablet, Rfl: 3 .  magic mouthwash w/lidocaine SOLN, Take 5 mLs by mouth 4 (four) times daily as needed  for mouth pain. Dukes Magic Mouthwash.   Gargle and spit, Disp: 180 mL, Rfl: 0 .  NON FORMULARY, Soma Biotics, Disp: , Rfl:  .  Omega-3 Fatty Acids (FISH OIL PO), Take by mouth., Disp: , Rfl:  .  Probiotic Product (ALIGN PO), Take by mouth daily., Disp: , Rfl:  .  Psyllium (METAMUCIL PO), Take by mouth., Disp: , Rfl:   EXAM:  Filed Vitals:   01/21/16 1036  BP: 116/82  Pulse: 66  Temp: 98.5 F (36.9 C)    Body mass index is 35.67 kg/(m^2).  GENERAL: vitals reviewed and listed above, alert, oriented, appears well hydrated and in no acute distress  HEENT: atraumatic, conjunttiva clear, no obvious abnormalities on inspection of external nose and ears  NECK: no obvious masses on inspection  SKIN: 3 cm in diameter erythematous scaly patch on the L thigh with smaller patch several inches away. No warmth, induration or drainage.  MS: moves all extremities without noticeable abnormality  PSYCH: pleasant and cooperative, no obvious depression or anxiety  ASSESSMENT AND PLAN:  Discussed the following assessment and plan:  Rash and nonspecific skin eruption  -Query fungal, no  signs of bacterial infection at this point, possibly had secondary bacterial infection on initial presentation. -Trial of treatment with topical azole, possible addition of topical steroid if eczematous or reaction -advised if not resolving with these treatments would advise biopsy with me or dermatologist -Patient advised to return or notify a doctor immediately if symptoms worsen or persist or new concerns arise.  Patient Instructions  Please cancel any follow-up that is currently scheduled and schedule a follow-up appointment in 4 weeks.  Apply clotrimazole antifungal cream twice daily for 4 weeks or until lesion is gone for 5 days.  Call in 2 weeks to let us know how this is going, call sooner if the rash is worsening or you have new symptoms.     Kriste Basque R.

## 2016-01-24 ENCOUNTER — Telehealth: Payer: Self-pay | Admitting: Family Medicine

## 2016-01-24 NOTE — Telephone Encounter (Signed)
Pt states he drinks a lot and wants to have his liver checked when he does the labs Dr Selena BattenKim ordered.

## 2016-01-26 NOTE — Telephone Encounter (Signed)
A CMP (includes liver enzymes) was included on the labs I advised. He needs to set up a lab appointment (if not already done) for fasting labs, or do them at his follow up. Thanks.

## 2016-01-31 ENCOUNTER — Ambulatory Visit (INDEPENDENT_AMBULATORY_CARE_PROVIDER_SITE_OTHER): Payer: BLUE CROSS/BLUE SHIELD | Admitting: Family Medicine

## 2016-01-31 ENCOUNTER — Encounter: Payer: Self-pay | Admitting: Family Medicine

## 2016-01-31 ENCOUNTER — Encounter: Payer: Self-pay | Admitting: *Deleted

## 2016-01-31 VITALS — BP 104/68 | HR 77 | Temp 99.6°F | Ht 74.0 in | Wt 268.3 lb

## 2016-01-31 DIAGNOSIS — J029 Acute pharyngitis, unspecified: Secondary | ICD-10-CM | POA: Diagnosis not present

## 2016-01-31 LAB — POCT INFLUENZA A: Rapid Influenza A Ag: NEGATIVE

## 2016-01-31 LAB — POCT RAPID STREP A (OFFICE): Rapid Strep A Screen: NEGATIVE

## 2016-01-31 NOTE — Progress Notes (Signed)
Pre visit review using our clinic review tool, if applicable. No additional management support is needed unless otherwise documented below in the visit note. 

## 2016-01-31 NOTE — Progress Notes (Signed)
HPI: Luke StageBryan T. Hunt   Is here for an acute visit for sore throat -started: 2-3 days ago -symptoms:sore throat,  Body aches, fatigue, fevers ( 101 yesterday), some nasal stuffiness, left ear discomfort -denies: SOB, NVD, tooth pain -sick contacts/travel/risks: no reported flu, strep or tick exposure - reports history of strep throat almost yearly   ROS: See pertinent positives and negatives per HPI.  Past Medical History  Diagnosis Date  . Hypertension   . GERD   . Hyperlipemia   . Obesity   . Gout   . OSA (obstructive sleep apnea) 05/23/2012    on CPAP  . Syncope     related to blood draws, stitches  . Environmental allergies   . Chronic constipation     intermittent    Past Surgical History  Procedure Laterality Date  . Appendectomy    . Rigth knee arthroscopy      @ [facility] [facility]    Family History  Problem Relation Age of Onset  . Stroke Other   . Diabetes Other   . Heart disease Other     Social History   Social History  . Marital Status: Married    Spouse Name: N/A  . Number of Children: N/A  . Years of Education: N/A   Social History Main Topics  . Smoking status: Former Games developermoker  . Smokeless tobacco: None  . Alcohol Use: Yes  . Drug Use: No  . Sexual Activity: Not Asked   Other Topics Concern  . None   Social History Narrative   Work or School: Oncologistpackaging company manager      Home Situation: lives with wife and daughter and son      Spiritual Beliefs: Christian      Lifestyle: regular exercise - heavy lifter; diet poor           Current outpatient prescriptions:  .  hydrocortisone (ANUSOL-HC) 2.5 % rectal cream, Place 1 application rectally 2 (two) times daily., Disp: 30 g, Rfl: 0 .  losartan (COZAAR) 50 MG tablet, Take 1 tablet (50 mg total) by mouth daily., Disp: 90 tablet, Rfl: 3 .  magic mouthwash w/lidocaine SOLN, Take 5 mLs by mouth 4 (four) times daily as needed for mouth pain. Dukes Magic Mouthwash.   Gargle and spit, Disp: 180  mL, Rfl: 0 .  NON FORMULARY, Soma Biotics, Disp: , Rfl:  .  Omega-3 Fatty Acids (FISH OIL PO), Take by mouth., Disp: , Rfl:  .  Probiotic Product (ALIGN PO), Take by mouth daily., Disp: , Rfl:  .  Psyllium (METAMUCIL PO), Take by mouth., Disp: , Rfl:   EXAM:  Filed Vitals:   01/31/16 1357  BP: 104/68  Pulse: 77  Temp: 99.6 F (37.6 C)    Body mass index is 34.43 kg/(m^2).  GENERAL: vitals reviewed and listed above, alert, oriented, appears well hydrated and in no acute distress  HEENT: atraumatic, conjunttiva clear, no obvious abnormalities on inspection of external nose and ears, normal appearance of ear canals and TMs, clear nasal congestion, mild post oropharyngeal erythema with PND, 1+ tonsillar edema with exudate, no sinus TTP  NECK: no obvious masses on inspection  LUNGS: clear to auscultation bilaterally, no wheezes, rales or rhonchi, good air movement  CV: HRRR, no peripheral edema  MS: moves all extremities without noticeable abnormality  PSYCH: pleasant and cooperative, no obvious depression or anxiety  ASSESSMENT AND PLAN:  Discussed the following assessment and plan:  Sore throat - Plan: POCT Influenza A,  POC Rapid Strep A, Culture, Group A Strep   Rapid flu and strep test were negative.We discussed potential etiologies, with VURI vs influenza vs strep being most likely. He declined tamiflu after discussion risks/benefit. Supportive care as most likely viral, however if strep culture + will treat accordingly. We discussed treatment side effects,potential complications, likely course, antibiotic misuse, transmission, and signs of developing a serious illness. -of course, we advised to return or notify a doctor immediately if symptoms worsen or persist or new concerns arise.    Patient Instructions  BEFORE YOU LEAVE: -Work note, return to work when afebrile 24 hours  -We have ordered a strep culture. Normal results will be [facility] to your [facility]. If you have  not heard from [place] or can not find your results in [facility] in 1 week please contact our office.   INSTRUCTIONS FOR UPPER RESPIRATORY INFECTION:  -plenty of rest and fluids  -nasal saline wash 2-3 times daily (use prepackaged nasal saline or bottled/distilled water if making your own)   -can use AFRIN nasal spray for drainage and nasal congestion - but do NOT use longer then 3-4 days  -can use tylenol (in no history of liver disease) or ibuprofen (if no history of kidney disease, bowel bleeding or significant heart disease) as directed for aches and sorethroat  -in the winter time, using a humidifier at night is helpful (please follow cleaning instructions)  -if you are taking a cough medication - use only as directed, may also try a teaspoon of honey to coat the throat and throat lozenges. If given a cough medication with codeine or hydrocodone or other narcotic please be advised that this contains a strong and  potentially addicting medication. Please follow instructions carefully, take as little as possible and only use AS NEEDED for severe cough. Discuss potential side effects with your pharmacy. Please do not drive or operate machinery while taking these types of medications. Please do not take other sedating medications, drugs or alcohol while taking this medication without discussing with your doctor.  -for sore throat, salt water gargles can help  -follow up if you have fevers, facial pain, tooth pain, difficulty breathing or are worsening or symptoms persist longer then expected  Upper Respiratory Infection, Adult An upper respiratory infection (URI) is also known as the common cold. It is often caused by a type of germ (virus). Colds are easily spread (contagious). You can pass it to others by kissing, coughing, sneezing, or drinking out of the same glass. Usually, you get better in 1 to 3  weeks.  However, the cough can last for even longer. HOME CARE   Only take medicine as told by  your doctor. Follow instructions provided above.  Drink enough water and fluids to keep your pee (urine) clear or pale yellow.  Get plenty of rest.  Return to work when your temperature is < 100 for 24 hours or as told by your doctor. You may use a face mask and wash your hands to stop your cold from spreading. GET HELP RIGHT AWAY IF:   After the first few days, you feel you are getting worse.  You have questions about your medicine.  You have chills, shortness of breath, or red spit (mucus).  You have pain in the face for more then 1-2 days, especially when you bend forward.  You have a fever, puffy (swollen) neck, pain when you swallow, or white spots in the back of your throat.  You have a bad headache,  ear pain, sinus pain, or chest pain.  You have a high-pitched whistling sound when you breathe in and out (wheezing).  You cough up blood.  You have sore muscles or a stiff neck. MAKE SURE YOU:   Understand these instructions.  Will watch your condition.  Will get help right away if you are not doing well or get worse. Document Released: 04/10/2008 Document Revised: 01/15/2012 Document Reviewed: 01/28/2014 [facility] Patient Information 2015 [place], [place]. This information is not intended to replace advice given to you by your health care provider. Make sure you discuss any questions you have with your health care provider.      Kriste Basque R.

## 2016-01-31 NOTE — Telephone Encounter (Signed)
I informed the pt of the message below today at his office visit.

## 2016-01-31 NOTE — Patient Instructions (Addendum)
BEFORE YOU LEAVE: -Work note, return to work when afebrile 24 hours  -We have ordered a strep culture. Normal [facility]. If you have not heard from us or can not find your results in [facility] in 1 week please contact our office.   INSTRUCTIONS FOR UPPER RESPIRATORY INFECTION:  -plenty of rest and fluids  -nasal saline wash 2-3 times daily (use prepackaged nasal saline or bottled/distilled water if making your own)   -can use AFRIN nasal spray for drainage and nasal congestion - but do NOT use longer then 3-4 days  -can use tylenol (in no history of liver disease) or ibuprofen (if no history of kidney disease, bowel bleeding or significant heart disease) as directed for aches and sorethroat  -in the winter time, using a humidifier at night is helpful (please follow cleaning instructions)  -if you are taking a cough medication - use only as directed, may also try a teaspoon of honey to coat the throat and throat lozenges. If given a cough medication with codeine or hydrocodone or other narcotic please be advised that this contains a strong and  potentially addicting medication. Please follow instructions carefully, take as little as possible and only use AS NEEDED for severe cough. Discuss potential side effects with your pharmacy. Please do not drive or operate machinery while taking these types of medications. Please do not take other sedating medications, drugs or alcohol while taking this medication without discussing with your doctor.  -for sore throat, salt water gargles can help  -follow up if you have fevers, facial pain, tooth pain, difficulty breathing or are worsening or symptoms persist longer then expected  Upper Respiratory Infection, Adult An upper respiratory infection (URI) is also known as the common cold. It is often caused by a type of germ (virus). Colds are easily spread (contagious). You can pass it to others by kissing, coughing, sneezing,  or drinking out of the same glass. Usually, you get better in 1 to 3  weeks.  However, the cough can last for even longer. HOME CARE   Only take medicine as told by your doctor. Follow instructions provided above.  Drink enough water and fluids to keep your pee (urine) clear or pale yellow.  Get plenty of rest.  Return to work when your temperature is < 100 for 24 hours or as told by your doctor. You may use a face mask and wash your hands to stop your cold from spreading. GET HELP RIGHT AWAY IF:   After the first few days, you feel you are getting worse.  You have questions about your medicine.  You have chills, shortness of breath, or red spit (mucus).  You have pain in the face for more then 1-2 days, especially when you bend forward.  You have a fever, puffy (swollen) neck, pain when you swallow, or white spots in the back of your throat.  You have a bad headache, ear pain, sinus pain, or chest pain.  You have a high-pitched whistling sound when you breathe in and out (wheezing).  You cough up blood.  You have sore muscles or a stiff neck. MAKE SURE YOU:   Understand these instructions.  Will watch your condition.  Will get help right away if you are not doing well or get worse. Document Released: 04/10/2008 Document Revised: 01/15/2012 Document Reviewed: 01/28/2014 [facility] Patient Information 2015 [place], [place]. This information is not intended to replace advice given to you by your health care provider.  Make sure you discuss any questions you have with your health care provider.  

## 2016-02-02 LAB — CULTURE, GROUP A STREP: Organism ID, Bacteria: NORMAL

## 2016-02-03 ENCOUNTER — Ambulatory Visit: Payer: BLUE CROSS/BLUE SHIELD | Admitting: Family Medicine

## 2016-02-16 ENCOUNTER — Encounter: Payer: Self-pay | Admitting: Gastroenterology

## 2016-02-16 ENCOUNTER — Telehealth: Payer: Self-pay | Admitting: Family Medicine

## 2016-02-16 ENCOUNTER — Ambulatory Visit (INDEPENDENT_AMBULATORY_CARE_PROVIDER_SITE_OTHER): Payer: BLUE CROSS/BLUE SHIELD | Admitting: Gastroenterology

## 2016-02-16 VITALS — BP 130/80 | HR 68 | Ht 73.0 in | Wt 276.2 lb

## 2016-02-16 DIAGNOSIS — R194 Change in bowel habit: Secondary | ICD-10-CM

## 2016-02-16 DIAGNOSIS — K219 Gastro-esophageal reflux disease without esophagitis: Secondary | ICD-10-CM

## 2016-02-16 DIAGNOSIS — K5900 Constipation, unspecified: Secondary | ICD-10-CM

## 2016-02-16 MED ORDER — NA SULFATE-K SULFATE-MG SULF 17.5-3.13-1.6 GM/177ML PO SOLN: 1.0000 | Freq: Once | ORAL | Status: DC

## 2016-02-16 NOTE — Progress Notes (Signed)
Luke StageBryan T. Hunt    [id number]    08/14/1971  Primary Care Physician:KIM, Damita LackHANNAH R., DO  Referring Physician: Terressa KoyanagiHannah R Kim, DO [address] [place], [place] [id number]  Chief complaint:  Change in bowel habits  HPI: 45 year old male here with complaints of recent change in bowel habits in the past 1-2 years. No significant change in diet or medications. Drinks about 6, 16oz bottles of water a day and he also exercises regularly. No change in weight recently. Denies any family history of colon cancer. Denies any nausea, vomiting, abdominal pain, melena or bright red blood per rectum    Outpatient Encounter Prescriptions as of 02/16/2016  Medication Sig  . losartan (COZAAR) 50 MG tablet Take 1 tablet (50 mg total) by mouth daily.  . Omega-3 Fatty Acids (FISH OIL PO) Take by mouth.  Marland Kitchen. omeprazole (PRILOSEC OTC) 20 MG tablet Take 20 mg by mouth daily.  . Psyllium (METAMUCIL PO) Take by mouth.  . [DISCONTINUED] hydrocortisone (ANUSOL-HC) 2.5 % rectal cream Place 1 application rectally 2 (two) times daily.  . [DISCONTINUED] magic mouthwash w/lidocaine SOLN Take 5 mLs by mouth 4 (four) times daily as needed for mouth pain. Dukes Magic Mouthwash.   Gargle and spit  . [DISCONTINUED] NON FORMULARY Soma Biotics  . [DISCONTINUED] Probiotic Product (ALIGN PO) Take by mouth daily.   No facility-administered encounter medications on file as of 02/16/2016.    Allergies as of 02/16/2016  . (No Known Allergies)    Past Medical History  Diagnosis Date  . Hypertension   . GERD   . Hyperlipemia   . Obesity   . Gout   . OSA (obstructive sleep apnea) 05/23/2012    on CPAP  . Syncope     related to blood draws, stitches  . Environmental allergies   . Chronic constipation     intermittent    Past Surgical History  Procedure Laterality Date  . Appendectomy    . Rigth knee arthroscopy      @ [place] [facility]    Family History  Problem Relation Age of Onset  . Stroke Maternal  Grandmother   . Diabetes Mother   . Heart disease Maternal Grandfather   . Breast cancer Mother   . Cystic fibrosis Maternal Aunt   . Diabetes Brother   . Diabetes Maternal Grandfather     Social History   Social History  . Marital Status: Married    Spouse Name: N/A  . Number of Children: 2  . Years of Education: N/A   Occupational History  . manager distribution    Social History Main Topics  . Smoking status: Former Smoker -- 6 years    Types: Cigarettes    Quit date: 02/15/2006  . Smokeless tobacco: Never Used  . Alcohol Use: 8.4 oz/week    14 Standard drinks or equivalent per week     Comment: 2 per day  . Drug Use: No  . Sexual Activity: Not on file   Other Topics Concern  . Not on file   Social History Narrative   Work or School: Oncologistpackaging company manager      Home Situation: lives with wife and daughter and son      Spiritual Beliefs: Christian      Lifestyle: regular exercise - heavy lifter; diet poor            Review of systems: Review of Systems  Constitutional: Negative for fever and  chills.  HENT: Negative.   Eyes: Negative for blurred vision.  Respiratory: Negative for cough, shortness of breath and wheezing.   Cardiovascular: Negative for chest pain and palpitations.  Gastrointestinal: as per HPI Genitourinary: Negative for dysuria, urgency, frequency and hematuria.  Musculoskeletal: Negative for myalgias, back pain and joint pain.  Skin: Negative for itching and rash.  Neurological: Negative for dizziness, tremors, focal weakness, seizures and loss of consciousness.  Endo/Heme/Allergies: Negative for environmental allergies.  Psychiatric/Behavioral: Negative for depression, suicidal ideas and hallucinations.  All other systems reviewed and are negative.   Physical Exam: Filed Vitals:   02/16/16 0834  BP: 130/80  Pulse: 68   Gen:      No acute distress HEENT:  EOMI, sclera anicteric Neck:     No masses; no thyromegaly Lungs:     Clear to auscultation bilaterally; normal respiratory effort CV:         Regular rate and rhythm; no murmurs Abd:      + bowel sounds; soft, non-tender; no palpable masses, no distension, ventral hernia+ Ext:    No edema; adequate peripheral perfusion Skin:      Warm and dry; no rash Neuro: alert and oriented x 3 Psych: normal mood and affect  Data Reviewed:  Reviewed chart in epic   Assessment and Plan/Recommendations:  45 year old male with obesity, obstructive sleep apnea here with complaints of recent change in bowel habits.  We'll schedule for colonoscopy to evaluate Advised patient to continue increase fluid intake Start then a fiber 1 tablespoon 3 times a day 1 capful MiraLAX daily and titrate as needed to have 1 soft bowel movement a day Return after the procedure  K. Scherry Ran , MD [phone] Mon-Fri 8a-5p [phone] after 5p, weekends, holidays

## 2016-02-16 NOTE — Telephone Encounter (Signed)
Pt said he would like a referral to see Dr Sharrell KuJeffrey Medoff a Gastroenterology. He saw a [facility] today and was not impress would like to see Dr Kinnie ScalesMedoff

## 2016-02-16 NOTE — Patient Instructions (Signed)
You have been scheduled for a colonoscopy. Please follow written instructions given to you at your visit today.  Please pick up your prep supplies at the pharmacy within the next 1-3 days. If you use inhalers (even only as needed), please bring them with you on the day of your procedure. Your physician has requested that you go to www.startemmi.com and enter the access code given to you at your visit today. This web site gives a general overview about your procedure. However, you should still follow specific instructions given to you by our office regarding your preparation for the procedure.  Use Benefiber 1 tablespoon three times a day Use Miralax 1 capful daily as needed

## 2016-02-17 NOTE — Telephone Encounter (Signed)
Okay to refer if he wishes her his request.Please let him know, I am sorry that he did not have a good experience. I have heard good things about our gastroenterologists - but it is important that he feel it is a good fit.

## 2016-02-21 NOTE — Telephone Encounter (Signed)
Referral placed. Left detailed message on machine for patient.

## 2016-02-22 ENCOUNTER — Ambulatory Visit: Payer: BLUE CROSS/BLUE SHIELD | Admitting: Family Medicine

## 2016-02-25 ENCOUNTER — Telehealth: Payer: Self-pay | Admitting: Family Medicine

## 2016-02-25 NOTE — Telephone Encounter (Signed)
Does the pt need to make a lab appt there are no orders in .

## 2016-02-25 NOTE — Telephone Encounter (Signed)
lmovm to call and schedule a lab CMP

## 2016-02-25 NOTE — Telephone Encounter (Signed)
He does need a lab appt and orders are in dated 3/3.

## 2016-03-03 ENCOUNTER — Other Ambulatory Visit (INDEPENDENT_AMBULATORY_CARE_PROVIDER_SITE_OTHER): Payer: BLUE CROSS/BLUE SHIELD

## 2016-03-03 DIAGNOSIS — E039 Hypothyroidism, unspecified: Secondary | ICD-10-CM

## 2016-03-03 DIAGNOSIS — I10 Essential (primary) hypertension: Secondary | ICD-10-CM

## 2016-03-03 DIAGNOSIS — E785 Hyperlipidemia, unspecified: Secondary | ICD-10-CM | POA: Diagnosis not present

## 2016-03-03 DIAGNOSIS — D649 Anemia, unspecified: Secondary | ICD-10-CM

## 2016-03-03 LAB — LIPID PANEL
Cholesterol: 236 mg/dL — ABNORMAL HIGH (ref 0–200)
HDL: 51.2 mg/dL (ref >39.00)
LDL Cholesterol: 160 mg/dL — ABNORMAL HIGH (ref 0–99)
NonHDL: 185.22
Total CHOL/HDL Ratio: 5
Triglycerides: 128 mg/dL (ref 0.0–149.0)
VLDL: 25.6 mg/dL (ref 0.0–40.0)

## 2016-03-03 LAB — CBC WITH DIFFERENTIAL/PLATELET
Basophils Absolute: 0 10*3/uL (ref 0.0–0.1)
Basophils Relative: 0.6 % (ref 0.0–3.0)
Eosinophils Absolute: 0.5 10*3/uL (ref 0.0–0.7)
Eosinophils Relative: 6.3 % — ABNORMAL HIGH (ref 0.0–5.0)
HCT: 39.3 % (ref 39.0–52.0)
Hemoglobin: 13.5 g/dL (ref 13.0–17.0)
Lymphocytes Relative: 27.1 % (ref 12.0–46.0)
Lymphs Abs: 2 10*3/uL (ref 0.7–4.0)
MCHC: 34.3 g/dL (ref 30.0–36.0)
MCV: 86.7 fl (ref 78.0–100.0)
Monocytes Absolute: 0.5 10*3/uL (ref 0.1–1.0)
Monocytes Relative: 6.6 % (ref 3.0–12.0)
Neutro Abs: 4.4 10*3/uL (ref 1.4–7.7)
Neutrophils Relative %: 59.4 % (ref 43.0–77.0)
Platelets: 208 10*3/uL (ref 150.0–400.0)
RBC: 4.53 Mil/uL (ref 4.22–5.81)
RDW: 14.5 % (ref 11.5–15.5)
WBC: 7.4 10*3/uL (ref 4.0–10.5)

## 2016-03-03 LAB — COMPREHENSIVE METABOLIC PANEL
ALT: 32 U/L (ref 0–53)
AST: 24 U/L (ref 0–37)
Albumin: 4.1 g/dL (ref 3.5–5.2)
Alkaline Phosphatase: 48 U/L (ref 39–117)
BUN: 18 mg/dL (ref 6–23)
CO2: 27 mEq/L (ref 19–32)
Calcium: 9.6 mg/dL (ref 8.4–10.5)
Chloride: 103 mEq/L (ref 96–112)
Creatinine, Ser: 1.04 mg/dL (ref 0.40–1.50)
GFR: 82.4 mL/min (ref >60.00)
Glucose, Bld: 120 mg/dL — ABNORMAL HIGH (ref 70–99)
Potassium: 4.5 mEq/L (ref 3.5–5.1)
Sodium: 136 mEq/L (ref 135–145)
Total Bilirubin: 0.5 mg/dL (ref 0.2–1.2)
Total Protein: 6.8 g/dL (ref 6.0–8.3)

## 2016-03-03 LAB — TSH: TSH: 2.5 u[IU]/mL (ref 0.35–4.50)

## 2016-03-14 ENCOUNTER — Encounter: Payer: BLUE CROSS/BLUE SHIELD | Admitting: Gastroenterology

## 2016-03-20 ENCOUNTER — Encounter: Payer: Self-pay | Admitting: Family Medicine

## 2016-03-20 DIAGNOSIS — F1010 Alcohol abuse, uncomplicated: Secondary | ICD-10-CM | POA: Insufficient documentation

## 2016-03-20 DIAGNOSIS — K5900 Constipation, unspecified: Secondary | ICD-10-CM | POA: Insufficient documentation

## 2016-03-21 ENCOUNTER — Other Ambulatory Visit: Payer: Self-pay | Admitting: Gastroenterology

## 2016-03-21 DIAGNOSIS — K769 Liver disease, unspecified: Secondary | ICD-10-CM

## 2016-03-31 ENCOUNTER — Ambulatory Visit
Admission: RE | Admit: 2016-03-31 | Discharge: 2016-03-31 | Disposition: A | Discharge status: Home / Self Care | Payer: BLUE CROSS/BLUE SHIELD | Source: Ambulatory Visit | Attending: Gastroenterology | Admitting: Gastroenterology

## 2016-03-31 DIAGNOSIS — K769 Liver disease, unspecified: Secondary | ICD-10-CM

## 2016-05-08 ENCOUNTER — Other Ambulatory Visit (HOSPITAL_COMMUNITY)
Admission: RE | Admit: 2016-05-08 | Discharge: 2016-05-08 | Disposition: A | Discharge status: Home / Self Care | Payer: BLUE CROSS/BLUE SHIELD | Source: Ambulatory Visit | Attending: Family Medicine | Admitting: Family Medicine

## 2016-05-08 ENCOUNTER — Ambulatory Visit (INDEPENDENT_AMBULATORY_CARE_PROVIDER_SITE_OTHER): Payer: BLUE CROSS/BLUE SHIELD | Admitting: Family Medicine

## 2016-05-08 ENCOUNTER — Encounter: Payer: Self-pay | Admitting: Family Medicine

## 2016-05-08 VITALS — BP 128/80 | HR 63 | Temp 98.2°F | Ht 73.0 in | Wt 270.8 lb

## 2016-05-08 DIAGNOSIS — Z113 Encounter for screening for infections with a predominantly sexual mode of transmission: Secondary | ICD-10-CM | POA: Diagnosis not present

## 2016-05-08 DIAGNOSIS — R300 Dysuria: Secondary | ICD-10-CM

## 2016-05-08 LAB — POCT URINALYSIS DIPSTICK
Bilirubin, UA: NEGATIVE
Blood, UA: NEGATIVE
Glucose, UA: NEGATIVE
Ketones, UA: NEGATIVE
Leukocytes, UA: NEGATIVE
Nitrite, UA: NEGATIVE
Protein, UA: NEGATIVE
Spec Grav, UA: 1.015
Urobilinogen, UA: 0.2
pH, UA: 5.5

## 2016-05-08 MED ORDER — CEFTRIAXONE SODIUM 1 G IJ SOLR
250.0000 mg | Freq: Once | INTRAMUSCULAR | Status: AC
  Administered 2016-05-08: 500 mg via INTRAMUSCULAR

## 2016-05-08 MED ORDER — DOXYCYCLINE HYCLATE 100 MG PO TABS: 100.0000 mg | ORAL_TABLET | Freq: Two times a day (BID) | ORAL | Status: DC

## 2016-05-08 NOTE — Progress Notes (Signed)
Pre visit review using our clinic review tool, if applicable. No additional management support is needed unless otherwise documented below in the visit note. 

## 2016-05-08 NOTE — Progress Notes (Signed)
Subjective:    Patient ID: Luke Hunt, male    DOB: 10-16-1971, 45 y.o.   MRN: [id number]  HPI  Luke Hunt is a 45 year old male who presents today with mild urinary burning for 2 weeks. Associated symptom of urinary frequency and urgency.  He denies fever, chills, sweats, N/VD, penile discharge, or previous history of STIs. Denies any penile rash. He reports a new sexual partner three weeks ago. He also reports condom use however he reports that "the condom came off" and he is concerned for a STI. He also reports a history of this same symptom and states that he has been treated with an antibiotic previously over one year ago. He has not tried any treatment at home. He is requesting screening for STIs because of his concern for this with other sexual partners. He is not specific for number of partners or gender. No alleviating or aggravating factors are noted  Review of Systems  Constitutional: Negative for fever and chills.  Respiratory: Negative for cough and shortness of breath.   Cardiovascular: Negative for chest pain, palpitations and leg swelling.  Gastrointestinal: Negative for nausea, vomiting, abdominal pain, diarrhea, blood in stool and anal bleeding.  Genitourinary: Positive for dysuria, urgency and frequency. Negative for hematuria, flank pain, discharge, penile swelling, genital sores and penile pain.  Musculoskeletal: Negative for myalgias and arthralgias.  Skin: Negative for rash.  Neurological: Negative for dizziness, light-headedness and headaches.  Psychiatric/Behavioral:       Denies depressed or anxious mood   Past Medical History  Diagnosis Date  . Hypertension   . GERD   . Hyperlipemia   . Obesity   . Gout   . OSA (obstructive sleep apnea) 05/23/2012    on CPAP  . Syncope     related to blood draws, stitches  . Environmental allergies   . Chronic constipation     intermittent     Social History   Social History  . Marital Status: Married    Spouse  Name: N/A  . Number of Children: 2  . Years of Education: N/A   Occupational History  . manager distribution    Social History Main Topics  . Smoking status: Former Smoker -- 6 years    Types: Cigarettes    Quit date: 02/15/2006  . Smokeless tobacco: Never Used  . Alcohol Use: 8.4 oz/week    14 Standard drinks or equivalent per week     Comment: 2 per day  . Drug Use: No  . Sexual Activity: Not on file   Other Topics Concern  . 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 Appendectomy    . Rigth knee arthroscopy      @ [facility] [facility]    Family History  Problem Relation Age of Onset  . Stroke Maternal Grandmother   . Diabetes Mother   . Heart disease Maternal Grandfather   . Breast cancer Mother   . Cystic fibrosis Maternal Aunt   . Diabetes Brother   . Diabetes Maternal Grandfather     No Known Allergies  Current Outpatient Prescriptions on File Prior to Visit  Medication Sig Dispense Refill  . losartan (COZAAR) 50 MG tablet Take 1 tablet (50 mg total) by mouth daily. 90 tablet 3  . Na Sulfate-K Sulfate-Mg Sulf (SUPREP BOWEL PREP) SOLN Take 1 kit by mouth once. 1 Bottle 0  . Omega-3 Fatty Acids (FISH OIL PO) Take by mouth.    Marland Kitchen omeprazole (PRILOSEC OTC) 20 MG tablet Take 20 mg by mouth daily.    . Psyllium (METAMUCIL PO) Take by mouth.     No current facility-administered medications on file prior to visit.    BP 128/80 mmHg  Pulse 63  Temp(Src) 98.2 F (36.8 C) (Oral)  Ht _0  (1.854 m)  Wt 270 lb 12.8 oz (122.834 kg)  BMI 35.74 kg/m2  SpO2 97%      Objective:   Physical Exam  Constitutional: He is oriented to person, place, and time. He appears well-developed and well-nourished.  Eyes: Pupils  are equal, round, and reactive to light. No scleral icterus.  Neck: Neck supple.  Cardiovascular: Normal rate.   Pulmonary/Chest: Effort normal and breath sounds normal. He has no wheezes. He has no rales.  Abdominal: Soft. Bowel sounds are normal. There is no tenderness. There is no rebound and no CVA tenderness.  Negative suprapubic discomfort  Genitourinary: Testes normal and penis normal.  Musculoskeletal: He exhibits no edema.  Lymphadenopathy:    He has no cervical adenopathy.  Neurological: He is alert and oriented to person, place, and time.  Skin: Skin is warm and dry. No rash noted.  Psychiatric: He has a normal mood and affect. His behavior is normal. Judgment and thought content normal.       Assessment & Plan:  1. Routine screening for STI (sexually transmitted infection) Discussed safe sex practices and screening recommendations with patient. - RPR - HIV antibody (with reflex) - Hepatitis C antibody - Hep B Surface Antibody  2. Burning with urination Unlikely that this is a UTI or infectious urethritis. Negative UA. Will send for STI screen and cover with doxycycline and ceftriaxone pending screen results.  - POCT urinalysis dipstick - Urine cytology ancillary only - doxycycline (VIBRA-TABS) 100 MG tablet; Take 1 tablet (100 mg total) by mouth 2 (two) times daily.  Dispense: 14 tablet; Refill: 0  Delano Metz, FNP-C

## 2016-05-08 NOTE — Patient Instructions (Signed)
Please take antibiotic as directed and follow up with your provider if symptoms do not improve, worsen, or you develop new symptoms for fever >101.  We have ordered labs or studies at this visit. It can take up to 1-2 weeks for results and processing. IF results require follow up or explanation, we will call you with instructions. Clinically stable results will be released to your [facility]. If you have not heard from us or cannot find your results in [facility] in 2 weeks please contact our office at [phone].  If you are not yet signed up for [facility], please consider signing up

## 2016-05-09 LAB — HEPATITIS B SURFACE ANTIBODY,QUALITATIVE: Hep B S Ab: NEGATIVE

## 2016-05-09 LAB — HIV ANTIBODY (ROUTINE TESTING W REFLEX): HIV 1&2 Ab, 4th Generation: NONREACTIVE

## 2016-05-09 LAB — HEPATITIS C ANTIBODY: HCV Ab: NEGATIVE

## 2016-05-09 LAB — RPR

## 2016-05-11 LAB — URINE CYTOLOGY ANCILLARY ONLY
Chlamydia: NEGATIVE
Neisseria Gonorrhea: NEGATIVE
Trichomonas: NEGATIVE

## 2016-05-12 LAB — URINE CYTOLOGY ANCILLARY ONLY
Bacterial vaginitis: NEGATIVE
Candida vaginitis: NEGATIVE

## 2016-07-12 ENCOUNTER — Telehealth: Payer: Self-pay | Admitting: Family Medicine

## 2016-07-12 NOTE — Telephone Encounter (Signed)
Pt would like to have a full blood panel done.  A1C, Liver and Kidney and any other normal blood work that he may need.  Pt state that there is nothing going wrong with him he just would like to have blood work done before his insurance runs out and will not have any insurance for 3-4 months. Pt state if you could help him with this he would greatly appreciate it.

## 2016-07-13 NOTE — Telephone Encounter (Signed)
I have not seen him in some time and looks like he has not had a physical in the last year? Would advise a CPE and will plan to order labs then after we discuss what we may need. Thanks.

## 2016-07-13 NOTE — Telephone Encounter (Signed)
Called an spoke with pt informing me that an appointment for a CPE was needed and lab work would be obtainable then. Pt verbalized understanding and states he going out of town for 6 weeks an will contact the office upon his return to schedule an appointment.

## 2016-11-07 ENCOUNTER — Other Ambulatory Visit: Payer: Self-pay | Admitting: Family Medicine

## 2016-11-29 ENCOUNTER — Ambulatory Visit (INDEPENDENT_AMBULATORY_CARE_PROVIDER_SITE_OTHER): Payer: Managed Care, Other (non HMO) | Admitting: Family Medicine

## 2016-11-29 ENCOUNTER — Encounter: Payer: Self-pay | Admitting: Family Medicine

## 2016-11-29 VITALS — BP 140/88 | HR 59 | Temp 98.2°F | Resp 18 | Wt 260.2 lb

## 2016-11-29 DIAGNOSIS — B9789 Other viral agents as the cause of diseases classified elsewhere: Secondary | ICD-10-CM

## 2016-11-29 DIAGNOSIS — J069 Acute upper respiratory infection, unspecified: Secondary | ICD-10-CM | POA: Diagnosis not present

## 2016-11-29 DIAGNOSIS — F1010 Alcohol abuse, uncomplicated: Secondary | ICD-10-CM

## 2016-11-29 NOTE — Progress Notes (Addendum)
Subjective:    Patient ID: Luke Hunt, male    DOB: 02/03/1971, 46 y.o.   MRN: [id number]  HPI This is a 46 yo male who presents today with 3 days of nasal congestion and head pressure. Some sore throat and left ear intermittent sharp pain. Cough mostly dry. Fever to 100.5, got better without any medication. Has been taking Motrin 2 tablets BID, Afrin x 1 dose a day, Alkaseltzer twice a day. Feels tired and winded with going up stairs. Some muscle aches which are relieved with ibuprofen.  Sick coworkers. Feels "really bad." Good fluid intake. Urine dark.   Has stopped drinking alcohol over the last 3 days.Was drinking up to 40 drinks a week. Using prayer and exercise for stress relief. Has stopped heavy drinking in the past. Has history of alcoholic fatty liver. Wife supportive.   Past Medical History:  Diagnosis Date  . Chronic constipation    intermittent  . Environmental allergies   . GERD   . Gout   . Hyperlipemia   . Hypertension   . Obesity   . OSA (obstructive sleep apnea) 05/23/2012   on CPAP  . Syncope    related to blood draws, stitches   Past Surgical History:  Procedure Laterality Date  . APPENDECTOMY    . Rigth knee arthroscopy     @ [facility] [facility]   Family History  Problem Relation Age of Onset  . Stroke Maternal Grandmother   . Diabetes Mother   . Heart disease Maternal Grandfather   . Breast cancer Mother   . Cystic fibrosis Maternal Aunt   . Diabetes Brother   . Diabetes Maternal Grandfather    Social History  Substance Use Topics  . Smoking status: Former Smoker    Years: 6.00    Types: Cigarettes    Quit date: 02/15/2006  . Smokeless tobacco: Never Used  . Alcohol use 8.4 oz/week    14 Standard drinks or equivalent per week     Comment: 2 per day      Review of Systems Per HPI    Objective:   Physical Exam  Constitutional: He is oriented to person, place, and time. He appears well-developed and well-nourished. No distress.  HENT:  Head:  Normocephalic and atraumatic.  Right Ear: Tympanic membrane, external ear and ear canal normal.  Left Ear: Tympanic membrane and external ear normal.  Nose: Mucosal edema and rhinorrhea present. Right sinus exhibits maxillary sinus tenderness and frontal sinus tenderness. Left sinus exhibits maxillary sinus tenderness and frontal sinus tenderness.  Mouth/Throat: Mucous membranes are normal. Oropharyngeal exudate and posterior oropharyngeal erythema present. No posterior oropharyngeal edema.  Moderate amount cerumen in left ear, light yellow. Small area of TM visible, no erythema.   Eyes: Conjunctivae are normal.  Neck: Normal range of motion. Neck supple.  Cardiovascular: Normal rate, regular rhythm and normal heart sounds.   Pulmonary/Chest: Effort normal and breath sounds normal.  No dyspnea with ambulation or conversing.   Lymphadenopathy:    He has no cervical adenopathy.  Neurological: He is alert and oriented to person, place, and time.  Skin: Skin is warm and dry. He is not diaphoretic.  Psychiatric: He has a normal mood and affect. His behavior is normal. Judgment and thought content normal.  Vitals reviewed.     BP 140/88 (BP Location: Left Arm, Patient Position: Sitting, Cuff Size: Normal)   Pulse (!) 59   Temp 98.2 F (36.8 C) (Oral)   Resp 18  Wt 260 lb 3.2 oz (118 kg)   SpO2 98%   BMI 34.33 kg/m  Wt Readings from Last 3 Encounters:  11/29/16 260 lb 3.2 oz (118 kg)  05/08/16 270 lb 12.8 oz (122.8 kg)  02/16/16 276 lb 4 oz (125.3 kg)       Assessment & Plan:  1. Viral URI - likely viral, could be influenza, difficult to determine with symptoms that could be related to stopping heavy drinking, offered Tamiflu, patient declined, provided instructions for symptomatic treatment (see below) and RTC/ER precautions. - Provided written and verbal information regarding diagnosis and treatment. -  Patient Instructions  For nasal congestion you can use Afrin nasal spray  for 4 days max, Sudafed 1-2 times a day, saline nasal spray (generic is fine for all). For cough you can try Delsym. Plain mucinex (generic is fine) to thin your sputum.  Drink enough fluids to make your urine light yellow. For fever/chill/muscle aches you can take over the counter acetaminophen or ibuprofen.  Please come back in if you are not better in 5-7 days or if you develop wheezing, shortness of breath or persistent vomiting.     2. Alcohol abuse - patient aware of potential withdrawal symptoms - discussed follow up with PCP/ER if confusion/hallucinations, persistent fever  - offered additional resources, patient declined at this time   Olean Ree, FNP-BC  [facility] [place], [facility]  11/29/2016 3:10 PM

## 2016-11-29 NOTE — Progress Notes (Signed)
Pre visit review using our clinic review tool, if applicable. No additional management support is needed unless otherwise documented below in the visit note. 

## 2016-11-29 NOTE — Patient Instructions (Signed)
For nasal congestion you can use Afrin nasal spray for 4 days max, Sudafed 1-2 times a day, saline nasal spray (generic is fine for all). For cough you can try Delsym. Plain mucinex (generic is fine) to thin your sputum.  Drink enough fluids to make your urine light yellow. For fever/chill/muscle aches you can take over the counter acetaminophen or ibuprofen.  Please come back in if you are not better in 5-7 days or if you develop wheezing, shortness of breath or persistent vomiting.

## 2016-12-12 NOTE — Progress Notes (Signed)
Medical screening examination/treatment/procedure(s) were performed by non-physician practitioner and as supervising physician I was immediately available for consultation/collaboration. I agree with above. Maylyn Narvaiz, DO   

## 2017-01-04 ENCOUNTER — Telehealth: Payer: Self-pay | Admitting: *Deleted

## 2017-01-04 DIAGNOSIS — F1010 Alcohol abuse, uncomplicated: Secondary | ICD-10-CM

## 2017-01-04 NOTE — Telephone Encounter (Signed)
Dr Selena BattenKim received notes from Dr Kinnie ScalesMedoff which stated the pt needs a CBC, CMP and PT ordered.  I called the pt and informed him of this and that the labs can be done at his physical exam on 3/6 and he agreed.

## 2017-01-08 NOTE — Progress Notes (Signed)
HPI:  Here for CPE:  -Concerns and/or follow up today: none Seeing Dr. Kinnie Scales, borderline liver issue, whom advised some labs which he wanted to get here. Hx OSA, GERD, Consipation, Alcohol abuse, Obesity and hyperlipidemia. Still drinking but cut back from 42 per week to 20 per week. Exercising 7 days per week and trying to eat healthy. Drinks lots of water. Reports feels well. BP up today but he reports stressed and did not take his BP med. Wants STI testing. No symptoms, no exposures, does not want penile sample - wants urine test for Gc/Chlam. Sees Dermatologist for skin exams.  -Vaccines: UTD  -sexual activity: yes, male partner, no new partners  -wants STI testing, Hep C screening (if born 79-1964): no  -FH colon or prstate ca: see FH Last colon cancer screening: sees GI Last prostate ca screening: n/a  -Alcohol, Tobacco, drug use: see social history  Review of Systems - no fevers, unintentional weight loss, vision loss, hearing loss, chest pain, sob, hemoptysis, melena, hematochezia, hematuria, genital discharge, changing or concerning skin lesions, bleeding, bruising, loc, thoughts of self harm or SI  Past Medical History:  Diagnosis Date  . Chronic constipation    intermittent  . Environmental allergies   . GERD   . Gout   . Hyperlipemia   . Hypertension   . Obesity   . OSA (obstructive sleep apnea) 05/23/2012   on CPAP  . Syncope    related to blood draws, stitches    Past Surgical History:  Procedure Laterality Date  . APPENDECTOMY    . Rigth knee arthroscopy     @ [place] [facility]    Family History  Problem Relation Age of Onset  . Stroke Maternal Grandmother   . Diabetes Mother   . Heart disease Maternal Grandfather   . Breast cancer Mother   . Cystic fibrosis Maternal Aunt   . Diabetes Brother   . Diabetes Maternal Grandfather     Social History   Social History  . Marital status: Married    Spouse name: N/A  . Number of children: 2  .  Years of education: N/A   Occupational History  . manager distribution    Social History Main Topics  . Smoking status: Former Smoker    Years: 6.00    Types: Cigarettes    Quit date: 02/15/2006  . Smokeless tobacco: Never Used  . Alcohol use 8.4 oz/week    14 Standard drinks or equivalent per week     Comment: 2 per day  . Drug use: No  . Sexual activity: Not Asked   Other Topics Concern  . None   Social History Narrative   Work or School: Oncologist Situation: lives with wife and daughter and son      Spiritual Beliefs: Christian      Lifestyle: regular exercise - heavy lifter; diet poor           Current Outpatient Prescriptions:  .  losartan (COZAAR) 50 MG tablet, TAKE 1 TABLET BY MOUTH DAILY, Disp: 90 tablet, Rfl: 1 .  metroNIDAZOLE (METROGEL) 0.75 % gel, , Disp: , Rfl:  .  Omega-3 Fatty Acids (FISH OIL PO), Take by mouth., Disp: , Rfl:  .  omeprazole (PRILOSEC OTC) 20 MG tablet, Take 20 mg by mouth daily., Disp: , Rfl:  .  Plecanatide (TRULANCE PO), Take by mouth. As needed, Disp: , Rfl:   EXAM:  Vitals:   01/09/17 0750  BP: 118/90  Pulse: 76  Temp: 97.7 F (36.5 C)  TempSrc: Oral  Weight: 253 lb 14.4 oz (115.2 kg)  Height: 6' 0.5" (1.842 m)    Estimated body mass index is 33.96 kg/m as calculated from the following:   Height as of this encounter: 6' 0.5" (1.842 m).   Weight as of this encounter: 253 lb 14.4 oz (115.2 kg).  GENERAL: vitals reviewed and listed below, alert, oriented, appears well hydrated and in no acute distress  HEENT: head atraumatic, PERRLA, normal appearance of eyes, ears, nose and mouth. moist mucus membranes.  NECK: supple, no masses or lymphadenopathy  LUNGS: clear to auscultation bilaterally, no rales, rhonchi or wheeze  CV: HRRR, no peripheral edema or cyanosis, normal pedal pulses  ABDOMEN: bowel sounds normal, soft, non tender to palpation, no masses, no rebound or guarding  GU:  declined  RECTAL: refused  SKIN: no rash or abnormal lesions  MS: normal gait, moves all extremities normally  NEURO: normal gait, speech and thought processing grossly intact, muscle tone grossly intact throughout  PSYCH: normal affect, pleasant and cooperative  ASSESSMENT AND PLAN:  Discussed the following assessment and plan:  Encounter for preventive health examination  Alcohol abuse - Plan: CBC with Differential/Platelet, Comprehensive metabolic panel, RPR, Protime-INR  Essential hypertension  BMI 33.0-33.9,adult - Plan: Hemoglobin A1c, Lipid panel  Routine screening for STI (sexually transmitted infection) - Plan: HIV antibody, GC/Chlamydia Probe Amp  -counseled at length on risks continued alcohol use, advised to quit, offered help. He feels he can quit on his own.  -advised follow up in 2 weeks to recheck BP, he prefers to monitor at home and schedule follow up if high  -Discussed and advised all [place] preventive services health task force level A and B recommendations for age, sex and risks.  --Advised at least 150 minutes of exercise per week and a healthy diet with avoidance of (less then 1 serving per week) processed foods, white starches, red meat, fast foods and sweets and consisting of: * 5-9 servings of fresh fruits and vegetables (not corn or potatoes) *nuts and seeds, beans *olives and olive oil *lean meats such as fish and white chicken  *whole grains  -labs, studies and vaccines per orders this encounter   Patient advised to return to clinic immediately if symptoms worsen or persist or new concerns.  Patient Instructions  BEFORE YOU LEAVE: -labs -follow up: 3 months  Cut back on alcohol.   We have ordered labs or studies at this visit. It can take up to 1-2 weeks for results and processing. IF results require follow up or explanation, we will call you with instructions. Clinically stable results will be released to your [facility]. If you have not  heard from us or cannot find your results in [facility] in 2 weeks please contact our office at [phone].  If you are not yet signed up for [facility], please consider signing up.   We recommend the following healthy lifestyle for LIFE: 1) Small portions.   Tip: eat off of a salad plate instead of a dinner plate.  Tip: if you need more or a snack choose fruits, veggies and/or a handful of nuts or seeds.  2) Eat a healthy clean diet.  * Tip: Avoid (less then 1 serving per week): processed foods, sweets, sweetened drinks, white starches (rice, flour, bread, potatoes, pasta, etc), red meat, fast foods, butter  *Tip: CHOOSE instead   * 5-9 servings per day of fresh or frozen fruits  and vegetables (but not corn, potatoes, bananas, canned or dried fruit)   *nuts and seeds, beans   *olives and olive oil   *small portions of lean meats such as fish and white chicken    *small portions of whole grains  3)Get at least 150 minutes of sweaty aerobic exercise per week.  4)Reduce stress - consider counseling, meditation and relaxation to balance other aspects of your life.   WE NOW OFFER   [facility] FAST TRACK!!!  SAME DAY Appointments for ACUTE CARE  Such as: Sprains, Injuries, cuts, abrasions, rashes, muscle pain, joint pain, back pain Colds, flu, sore throats, headache, allergies, cough, fever  Ear pain, sinus and eye infections Abdominal pain, nausea, vomiting, diarrhea, upset stomach Animal/insect bites  3 Easy Ways to Schedule: Walk-In Scheduling Call in scheduling Mychart Sign-up: [email or web address]                   No Follow-up on file.   Kriste Basque R., DO

## 2017-01-09 ENCOUNTER — Encounter: Payer: Self-pay | Admitting: Family Medicine

## 2017-01-09 ENCOUNTER — Ambulatory Visit (INDEPENDENT_AMBULATORY_CARE_PROVIDER_SITE_OTHER): Payer: Managed Care, Other (non HMO) | Admitting: Family Medicine

## 2017-01-09 VITALS — BP 118/90 | HR 76 | Temp 97.7°F | Ht 72.5 in | Wt 253.9 lb

## 2017-01-09 DIAGNOSIS — Z113 Encounter for screening for infections with a predominantly sexual mode of transmission: Secondary | ICD-10-CM | POA: Diagnosis not present

## 2017-01-09 DIAGNOSIS — I10 Essential (primary) hypertension: Secondary | ICD-10-CM | POA: Diagnosis not present

## 2017-01-09 DIAGNOSIS — Z0000 Encounter for general adult medical examination without abnormal findings: Secondary | ICD-10-CM | POA: Diagnosis not present

## 2017-01-09 DIAGNOSIS — F1010 Alcohol abuse, uncomplicated: Secondary | ICD-10-CM | POA: Diagnosis not present

## 2017-01-09 DIAGNOSIS — Z6833 Body mass index (BMI) 33.0-33.9, adult: Secondary | ICD-10-CM

## 2017-01-09 LAB — CBC WITH DIFFERENTIAL/PLATELET
Basophils Absolute: 0.1 10*3/uL (ref 0.0–0.1)
Basophils Relative: 0.9 % (ref 0.0–3.0)
Eosinophils Absolute: 0.1 10*3/uL (ref 0.0–0.7)
Eosinophils Relative: 0.8 % (ref 0.0–5.0)
HCT: 41.7 % (ref 39.0–52.0)
Hemoglobin: 14 g/dL (ref 13.0–17.0)
Lymphocytes Relative: 34.4 % (ref 12.0–46.0)
Lymphs Abs: 2.3 10*3/uL (ref 0.7–4.0)
MCHC: 33.7 g/dL (ref 30.0–36.0)
MCV: 88.3 fl (ref 78.0–100.0)
Monocytes Absolute: 0.5 10*3/uL (ref 0.1–1.0)
Monocytes Relative: 6.9 % (ref 3.0–12.0)
Neutro Abs: 3.8 10*3/uL (ref 1.4–7.7)
Neutrophils Relative %: 57 % (ref 43.0–77.0)
Platelets: 255 10*3/uL (ref 150.0–400.0)
RBC: 4.72 Mil/uL (ref 4.22–5.81)
RDW: 14.1 % (ref 11.5–15.5)
WBC: 6.6 10*3/uL (ref 4.0–10.5)

## 2017-01-09 LAB — COMPREHENSIVE METABOLIC PANEL
ALT: 25 U/L (ref 0–53)
AST: 21 U/L (ref 0–37)
Albumin: 4.4 g/dL (ref 3.5–5.2)
Alkaline Phosphatase: 50 U/L (ref 39–117)
BUN: 19 mg/dL (ref 6–23)
CO2: 30 mEq/L (ref 19–32)
Calcium: 10.1 mg/dL (ref 8.4–10.5)
Chloride: 102 mEq/L (ref 96–112)
Creatinine, Ser: 1.12 mg/dL (ref 0.40–1.50)
GFR: 75.35 mL/min (ref >60.00)
Glucose, Bld: 114 mg/dL — ABNORMAL HIGH (ref 70–99)
Potassium: 4.7 mEq/L (ref 3.5–5.1)
Sodium: 138 mEq/L (ref 135–145)
Total Bilirubin: 0.9 mg/dL (ref 0.2–1.2)
Total Protein: 7.2 g/dL (ref 6.0–8.3)

## 2017-01-09 LAB — HEMOGLOBIN A1C: Hgb A1c MFr Bld: 5.9 % (ref 4.6–6.5)

## 2017-01-09 LAB — PROTIME-INR
INR: 1.2 ratio — ABNORMAL HIGH (ref 0.8–1.0)
Prothrombin Time: 12.1 s (ref 9.6–13.1)

## 2017-01-09 LAB — LIPID PANEL
Cholesterol: 225 mg/dL — ABNORMAL HIGH (ref 0–200)
HDL: 48.6 mg/dL (ref >39.00)
LDL Cholesterol: 151 mg/dL — ABNORMAL HIGH (ref 0–99)
NonHDL: 176.59
Total CHOL/HDL Ratio: 5
Triglycerides: 127 mg/dL (ref 0.0–149.0)
VLDL: 25.4 mg/dL (ref 0.0–40.0)

## 2017-01-09 NOTE — Patient Instructions (Signed)
BEFORE YOU LEAVE: -labs -follow up: 3 months  Cut back on alcohol.   We have ordered labs or studies at this visit. It can take up to 1-2 weeks for results and processing. IF results require follow up or explanation, we will call you with instructions. Clinically stable results will be released to your [facility]. If you have not heard from us or cannot find your results in [facility] in 2 weeks please contact our office at [phone].  If you are not yet signed up for [facility], please consider signing up.   We recommend the following healthy lifestyle for LIFE: 1) Small portions.   Tip: eat off of a salad plate instead of a dinner plate.  Tip: if you need more or a snack choose fruits, veggies and/or a handful of nuts or seeds.  2) Eat a healthy clean diet.  * Tip: Avoid (less then 1 serving per week): processed foods, sweets, sweetened drinks, white starches (rice, flour, bread, potatoes, pasta, etc), red meat, fast foods, butter  *Tip: CHOOSE instead   * 5-9 servings per day of fresh or frozen fruits and vegetables (but not corn, potatoes, bananas, canned or dried fruit)   *nuts and seeds, beans   *olives and olive oil   *small portions of lean meats such as fish and white chicken    *small portions of whole grains  3)Get at least 150 minutes of sweaty aerobic exercise per week.  4)Reduce stress - consider counseling, meditation and relaxation to balance other aspects of your life.   WE NOW OFFER   [facility] FAST TRACK!!!  SAME DAY Appointments for ACUTE CARE  Such as: Sprains, Injuries, cuts, abrasions, rashes, muscle pain, joint pain, back pain Colds, flu, sore throats, headache, allergies, cough, fever  Ear pain, sinus and eye infections Abdominal pain, nausea, vomiting, diarrhea, upset stomach Animal/insect bites  3 Easy Ways to Schedule: Walk-In Scheduling Call in scheduling Mychart Sign-up: [email or web address]

## 2017-01-09 NOTE — Progress Notes (Signed)
Pre visit review using our clinic review tool, if applicable. No additional management support is needed unless otherwise documented below in the visit note. 

## 2017-01-10 LAB — HIV ANTIBODY (ROUTINE TESTING W REFLEX): HIV 1&2 Ab, 4th Generation: NONREACTIVE

## 2017-01-10 LAB — RPR

## 2017-01-11 ENCOUNTER — Telehealth: Payer: Self-pay | Admitting: Family Medicine

## 2017-01-11 LAB — GC/CHLAMYDIA PROBE AMP
CT Probe RNA: NOT DETECTED
GC Probe RNA: NOT DETECTED

## 2017-01-11 NOTE — Telephone Encounter (Signed)
See results note. 

## 2017-01-11 NOTE — Telephone Encounter (Signed)
Pt returning your call

## 2017-02-20 ENCOUNTER — Telehealth: Payer: Self-pay | Admitting: Family Medicine

## 2017-02-20 NOTE — Telephone Encounter (Signed)
Pt has been taking losartan 50 mg twice a day instead of once a day. Pt bp has been running 130/50 since he has been doubling his med. Please advise. Pt has an appt in June. Pt also [facility] new [facility]

## 2017-02-22 MED ORDER — LOSARTAN POTASSIUM 50 MG PO TABS: 50.0000 mg | ORAL_TABLET | Freq: Two times a day (BID) | ORAL | 0 refills | Status: DC

## 2017-02-22 NOTE — Telephone Encounter (Signed)
Ok to send per pt request until seen for his appt. Thanks.

## 2017-02-22 NOTE — Telephone Encounter (Signed)
I left a detailed message at the pts cell number the Rx was sent to [facility] with new instructions to take Losartan twice a day.

## 2017-04-09 NOTE — Progress Notes (Signed)
HPI:  Follow up:  OSA: - on CPAP  HTN/Obesity/HLD: -meds: losartan -exercising 7 days per week -reports healthy diet -no CP, SOB, swelling  Alcohol abuse/elevated liver enzymes/constipation/GERD: -seeing GI -used to drink 42 drinks per week, down to 20 last visit, now reports no drinks Mon-Friday, then about 12-14 over the weekend - reports max 14 -he does not feel he needs help or is addicted and plans to continue to cut back -he is reading book on alcoholism to help him with a plan to cut back -meds: omeprazole -denies: abd pain, swelling, difficulty cutting back, vomiting   ROS: See pertinent positives and negatives per HPI.  Past Medical History:  Diagnosis Date  . Chronic constipation    intermittent  . Environmental allergies   . GERD   . Gout   . Hyperlipemia   . Hypertension   . Obesity   . OSA (obstructive sleep apnea) 05/23/2012   on CPAP  . Syncope    related to blood draws, stitches    Past Surgical History:  Procedure Laterality Date  . APPENDECTOMY    . Rigth knee arthroscopy     @ [facility] [facility]    Family History  Problem Relation Age of Onset  . Stroke Maternal Grandmother   . Diabetes Mother   . Heart disease Maternal Grandfather   . Breast cancer Mother   . Cystic fibrosis Maternal Aunt   . Diabetes Brother   . Diabetes Maternal Grandfather     Social History   Social History  . Marital status: Married    Spouse name: N/A  . Number of children: 2  . Years of education: N/A   Occupational History  . manager distribution    Social History Main Topics  . Smoking status: Former Smoker    Years: 6.00    Types: Cigarettes    Quit date: 02/15/2006  . Smokeless tobacco: Never Used  . Alcohol use 8.4 oz/week    14 Standard drinks or equivalent per week     Comment: 2 per day  . Drug use: No  . Sexual activity: Not Asked   Other Topics Concern  . None   Social History Narrative   Work or School: Oncologist Situation: lives with wife and daughter and son      Spiritual Beliefs: Christian      Lifestyle: regular exercise - heavy lifter; diet poor           Current Outpatient Prescriptions:  .  losartan (COZAAR) 50 MG tablet, Take 1 tablet (50 mg total) by mouth 2 (two) times daily., Disp: 180 tablet, Rfl: 0 .  Omega-3 Fatty Acids (FISH OIL PO), Take by mouth., Disp: , Rfl:  .  omeprazole (PRILOSEC OTC) 20 MG tablet, Take 20 mg by mouth daily., Disp: , Rfl:  .  Plecanatide (TRULANCE PO), Take by mouth. As needed, Disp: , Rfl:   EXAM:  Vitals:   04/10/17 0815  BP: 110/80  Pulse: 74  Temp: 97.9 F (36.6 C)    Body mass index is 33.17 kg/m.  GENERAL: vitals reviewed and listed above, alert, oriented, appears well hydrated and in no acute distress  HEENT: atraumatic, conjunttiva clear, no obvious abnormalities on inspection of external nose and ears  NECK: no obvious masses on inspection  LUNGS: clear to auscultation bilaterally, no wheezes, rales or rhonchi, good air movement  CV: HRRR, no peripheral edema  MS: moves all extremities without noticeable abnormality  PSYCH: pleasant and cooperative, no obvious depression or anxiety  ASSESSMENT AND PLAN:  Discussed the following assessment and plan:  Essential hypertension - Plan: CBC, Comprehensive metabolic panel  Hyperlipidemia, unspecified hyperlipidemia type - Plan: Lipid panel  BMI 33.0-33.9,adult  Gastroesophageal reflux disease, esophagitis presence not specified  Alcohol abuse - Plan: Comprehensive metabolic panel  -counseled again on alcohol use and advised to quit/continue to cut back and offered help -congratulated on changes and healthier lifestyle -labs -cont blood pressure meds -follow up 3-4 months -Patient advised to return or notify a doctor immediately if symptoms worsen or persist or new concerns arise.  Patient Instructions  BEFORE YOU LEAVE: -follow up: 3-4 months -labs  Continue to  cut back on drinking. Congrats on the changes so far!  Continue regular aerobic exercise (at least 150 minutes per week of sweaty exercise) and a healthy diet. Try to eat at least 5-9 servings of vegetables and fruits per day (not corn, potatoes or bananas.) Avoid sweets, red meat, pork, butter, fried foods, fast food, processed food, excessive dairy, eggs and coconut. Replace bad fats with good fats - fish, nuts and seeds, canola oil, olive oil.   We have ordered labs or studies at this visit. It can take up to 1-2 weeks for results and processing. IF results require follow up or explanation, we will call you with instructions. Clinically stable results will be released to your [facility]. If you have not heard from us or cannot find your results in [facility] in 2 weeks please contact our office at [phone].  If you are not yet signed up for [facility]ART, please consider signing up.  WE NOW OFFER    [facility] FAST TRACK!!!  SAME DAY Appointments for ACUTE CARE  Such as: Sprains, Injuries, cuts, abrasions, rashes, muscle pain, joint pain, back pain Colds, flu, sore throats, headache, allergies, cough, fever  Ear pain, sinus and eye infections Abdominal pain, nausea, vomiting, diarrhea, upset stomach Animal/insect bites  3 Easy Ways to Schedule: Walk-In Scheduling Call in scheduling Mychart Sign-up: [email or web address]                 Kriste BasqueKIM, HANNAH R., DO

## 2017-04-10 ENCOUNTER — Ambulatory Visit (INDEPENDENT_AMBULATORY_CARE_PROVIDER_SITE_OTHER): Payer: Managed Care, Other (non HMO) | Admitting: Family Medicine

## 2017-04-10 ENCOUNTER — Encounter: Payer: Self-pay | Admitting: Family Medicine

## 2017-04-10 VITALS — BP 110/80 | HR 74 | Temp 97.9°F | Ht 72.5 in | Wt 248.0 lb

## 2017-04-10 DIAGNOSIS — F1010 Alcohol abuse, uncomplicated: Secondary | ICD-10-CM | POA: Diagnosis not present

## 2017-04-10 DIAGNOSIS — K219 Gastro-esophageal reflux disease without esophagitis: Secondary | ICD-10-CM

## 2017-04-10 DIAGNOSIS — E785 Hyperlipidemia, unspecified: Secondary | ICD-10-CM

## 2017-04-10 DIAGNOSIS — Z6833 Body mass index (BMI) 33.0-33.9, adult: Secondary | ICD-10-CM | POA: Diagnosis not present

## 2017-04-10 DIAGNOSIS — I10 Essential (primary) hypertension: Secondary | ICD-10-CM

## 2017-04-10 LAB — COMPREHENSIVE METABOLIC PANEL
ALT: 24 U/L (ref 0–53)
AST: 21 U/L (ref 0–37)
Albumin: 4.3 g/dL (ref 3.5–5.2)
Alkaline Phosphatase: 49 U/L (ref 39–117)
BUN: 17 mg/dL (ref 6–23)
CO2: 29 mEq/L (ref 19–32)
Calcium: 9.7 mg/dL (ref 8.4–10.5)
Chloride: 103 mEq/L (ref 96–112)
Creatinine, Ser: 1.1 mg/dL (ref 0.40–1.50)
GFR: 76.85 mL/min (ref >60.00)
Glucose, Bld: 116 mg/dL — ABNORMAL HIGH (ref 70–99)
Potassium: 4.3 mEq/L (ref 3.5–5.1)
Sodium: 139 mEq/L (ref 135–145)
Total Bilirubin: 0.9 mg/dL (ref 0.2–1.2)
Total Protein: 6.9 g/dL (ref 6.0–8.3)

## 2017-04-10 LAB — CBC
HCT: 41 % (ref 39.0–52.0)
Hemoglobin: 13.6 g/dL (ref 13.0–17.0)
MCHC: 33.2 g/dL (ref 30.0–36.0)
MCV: 87.8 fl (ref 78.0–100.0)
Platelets: 232 10*3/uL (ref 150.0–400.0)
RBC: 4.67 Mil/uL (ref 4.22–5.81)
RDW: 13.9 % (ref 11.5–15.5)
WBC: 6.7 10*3/uL (ref 4.0–10.5)

## 2017-04-10 LAB — LIPID PANEL
Cholesterol: 235 mg/dL — ABNORMAL HIGH (ref 0–200)
HDL: 50.9 mg/dL (ref >39.00)
LDL Cholesterol: 156 mg/dL — ABNORMAL HIGH (ref 0–99)
NonHDL: 183.96
Total CHOL/HDL Ratio: 5
Triglycerides: 140 mg/dL (ref 0.0–149.0)
VLDL: 28 mg/dL (ref 0.0–40.0)

## 2017-04-10 NOTE — Patient Instructions (Signed)
BEFORE YOU LEAVE: -follow up: 3-4 months -labs  Continue to cut back on drinking. Congrats on the changes so far!  Continue regular aerobic exercise (at least 150 minutes per week of sweaty exercise) and a healthy diet. Try to eat at least 5-9 servings of vegetables and fruits per day (not corn, potatoes or bananas.) Avoid sweets, red meat, pork, butter, fried foods, fast food, processed food, excessive dairy, eggs and coconut. Replace bad fats with good fats - fish, nuts and seeds, canola oil, olive oil.   We have ordered labs or studies at this visit. It can take up to 1-2 weeks for results and processing. IF results require follow up or explanation, we will call you with instructions. Clinically stable results will be released to your [facility]. If you have not heard from us or cannot find your results in [facility] in 2 weeks please contact our office at [phone].  If you are not yet signed up for [facility], please consider signing up.  WE NOW OFFER   [place] [facility] FAST TRACK!!!  SAME DAY Appointments for ACUTE CARE  Such as: Sprains, Injuries, cuts, abrasions, rashes, muscle pain, joint pain, back pain Colds, flu, sore throats, headache, allergies, cough, fever  Ear pain, sinus and eye infections Abdominal pain, nausea, vomiting, diarrhea, upset stomach Animal/insect bites  3 Easy Ways to Schedule: Walk-In Scheduling Call in scheduling Mychart Sign-up: [email or web address]

## 2017-04-25 ENCOUNTER — Telehealth: Payer: Self-pay | Admitting: Family Medicine

## 2017-04-25 NOTE — Telephone Encounter (Signed)
Pt states Dr Kinnie ScalesMedoff says ok to start pravastatin. Pt says please call in to   [id number] - [place], [place] - [address]

## 2017-04-27 NOTE — Telephone Encounter (Signed)
Okay to send Rx 

## 2017-04-30 MED ORDER — PRAVASTATIN SODIUM 20 MG PO TABS: 20.0000 mg | ORAL_TABLET | Freq: Every day | ORAL | 3 refills | Status: DC

## 2017-04-30 NOTE — Telephone Encounter (Signed)
Ok - see prior result note.

## 2017-04-30 NOTE — Telephone Encounter (Signed)
Rx sent 

## 2017-05-21 ENCOUNTER — Other Ambulatory Visit: Payer: Self-pay | Admitting: Family Medicine

## 2017-07-26 ENCOUNTER — Encounter: Payer: Self-pay | Admitting: Family Medicine

## 2017-08-07 NOTE — Progress Notes (Signed)
HPI:  Luke Hunt is a pleasant 46 y.o. here for follow up. Chronic medical problems summarized below were reviewed for changes. Had been doing fairly well. Reports he has been eating healthier and getting regular exercise. He did not start the pravastatin. He has had some sort of upper respiratory illness last few days with low-grade fevers, nasal congestion, nausea and malaise. He is doing better today. He wants to check for the flu. Denies CP, headache, neck stiffness, vomiting, diarrhea, inability to tolerate fluids, hemoptysis,SOB, DOE, treatment intolerance or new symptoms. Reports he has cut back significantly on alcohol and is not drinking more than 3-4 drinks in any given day and about 10-12 and a week. Reports he does not have any withdrawal symptoms when he doesn't drink for several days.   OSA: - on CPAP  HTN/Obesity/HLD: -meds: losartan, pravastatin -exercising 7 days per week -reports healthy diet -no CP, SOB, swelling  Alcohol abuse/elevated liver enzymes/constipation/GERD: -seeing GI -used to drink 42 drinks per week, down to 10-12 per week and no more than 3-4 a day currently -he does not feel he needs help or is addicted and plans to continue to cut back -he is reading book on alcoholism to help him with a plan to cut back -meds: omeprazole -denies: abd pain, swelling, difficulty cutting back, vomiting   ROS: See pertinent positives and negatives per HPI.  Past Medical History:  Diagnosis Date  . Chronic constipation    intermittent  . Environmental allergies   . GERD   . Gout   . Hyperlipemia   . Hypertension   . Obesity   . OSA (obstructive sleep apnea) 05/23/2012   on CPAP  . Syncope    related to blood draws, stitches    Past Surgical History:  Procedure Laterality Date  . APPENDECTOMY    . Rigth knee arthroscopy     @ [facility] [facility]    Family History  Problem Relation Age of Onset  . Stroke Maternal Grandmother   . Diabetes Mother   .  Heart disease Maternal Grandfather   . Breast cancer Mother   . Cystic fibrosis Maternal Aunt   . Diabetes Brother   . Diabetes Maternal Grandfather     Social History   Social History  . Marital status: Married    Spouse name: N/A  . Number of children: 2  . Years of education: N/A   Occupational History  . manager distribution    Social History Main Topics  . Smoking status: Former Smoker    Years: 6.00    Types: Cigarettes    Quit date: 02/15/2006  . Smokeless tobacco: Never Used  . Alcohol use 8.4 oz/week    14 Standard drinks or equivalent per week     Comment: 2 per day  . Drug use: No  . Sexual activity: Not Asked   Other Topics Concern  . None   Social History Narrative   Work or School: Oncologist Situation: lives with wife and daughter and son      Spiritual Beliefs: Christian      Lifestyle: regular exercise - heavy lifter; diet poor           Current Outpatient Prescriptions:  .  losartan (COZAAR) 50 MG tablet, TAKE 1 TABLET TWICE A DAY, Disp: 180 tablet, Rfl: 1 .  Omega-3 Fatty Acids (FISH OIL PO), Take by mouth., Disp: , Rfl:  .  omeprazole (PRILOSEC OTC) 20 MG  tablet, Take 20 mg by mouth daily., Disp: , Rfl:  .  Plecanatide (TRULANCE PO), Take by mouth. As needed, Disp: , Rfl:  .  pravastatin (PRAVACHOL) 20 MG tablet, Take 1 tablet (20 mg total) by mouth daily., Disp: 90 tablet, Rfl: 3  EXAM:  Vitals:   08/09/17 0809  BP: 104/64  Pulse: 72  Temp: 99.1 F (37.3 C)  SpO2: 98%    Body mass index is 33.39 kg/m.  GENERAL: vitals reviewed and listed above, alert, oriented, appears well hydrated and in no acute distress  HEENT: atraumatic, conjunttiva clear, no obvious abnormalities on inspection of external nose and ears, normal appearance of ear canals and TMs, clear nasal congestion, mild post oropharyngeal erythema with PND, no tonsillar edema or exudate, no sinus TTP  NECK: no obvious masses on  inspection  LUNGS: clear to auscultation bilaterally, no wheezes, rales or rhonchi, good air movement  CV: HRRR, no peripheral edema  MS: moves all extremities without noticeable abnormality  PSYCH: pleasant and cooperative, no obvious depression or anxiety  ASSESSMENT AND PLAN:  Discussed the following assessment and plan:  Viral upper respiratory tract infection  Hyperlipidemia, unspecified hyperlipidemia type  Essential hypertension  Class 1 obesity with serious comorbidity and body mass index (BMI) of 33.0 to 33.9 in adult, unspecified obesity type  Alcohol abuse  -declined flu shot today -Suspect viral illness as cause of his symptoms and seems to be improving - return precautions advised, flu shot negative -He did not want to get labs today since not feeling well and plans to get his next visit -Start statin -Congratulated  and counseled on alcohol use -Patient advised to return or notify a doctor immediately if symptoms worsen or persist or new concerns arise.  Patient Instructions  BEFORE YOU LEAVE: -follow up:3-4 months, please come fasting and we will plan to check labs then instead of today  Please consider starting the cholesterol medication.  Congratulations on the progress of the alcohol! Please continue to cut back and continue the healthy lifestyle!  I hope you are feeling better soon with the bag. Aleve or Motrin for symptoms as needed and plan a of fluids. Please follow up with worsening or not improving as expected.   Kriste Basque R., DO

## 2017-08-09 ENCOUNTER — Encounter: Payer: Self-pay | Admitting: Family Medicine

## 2017-08-09 ENCOUNTER — Ambulatory Visit (INDEPENDENT_AMBULATORY_CARE_PROVIDER_SITE_OTHER): Payer: Managed Care, Other (non HMO) | Admitting: Family Medicine

## 2017-08-09 VITALS — BP 104/64 | HR 72 | Temp 99.1°F | Ht 72.5 in | Wt 249.6 lb

## 2017-08-09 DIAGNOSIS — E785 Hyperlipidemia, unspecified: Secondary | ICD-10-CM

## 2017-08-09 DIAGNOSIS — J069 Acute upper respiratory infection, unspecified: Secondary | ICD-10-CM

## 2017-08-09 DIAGNOSIS — E669 Obesity, unspecified: Secondary | ICD-10-CM | POA: Diagnosis not present

## 2017-08-09 DIAGNOSIS — Z6833 Body mass index (BMI) 33.0-33.9, adult: Secondary | ICD-10-CM

## 2017-08-09 DIAGNOSIS — F1010 Alcohol abuse, uncomplicated: Secondary | ICD-10-CM

## 2017-08-09 DIAGNOSIS — I10 Essential (primary) hypertension: Secondary | ICD-10-CM | POA: Diagnosis not present

## 2017-08-09 LAB — POC INFLUENZA A&B (BINAX/QUICKVUE)
Influenza A, POC: NEGATIVE
Influenza B, POC: NEGATIVE

## 2017-08-09 NOTE — Patient Instructions (Addendum)
BEFORE YOU LEAVE: -follow up:3-4 months, please come fasting and we will plan to check labs then instead of today  Please consider starting the cholesterol medication.  Please get your flu shot in the next one month.  Congratulations on the progress of the alcohol! Please continue to cut back and continue the healthy lifestyle!  I hope you are feeling better soon with the bag. Aleve or Motrin for symptoms as needed and plan a of fluids. Please follow up with worsening or not improving as expected.

## 2017-08-09 NOTE — Addendum Note (Signed)
Addended by: Johnella Moloney on: 08/09/2017 08:33 AM   Modules accepted: Orders

## 2017-08-19 IMAGING — US US ABDOMEN COMPLETE
1 series · 14 of 25 positions shown · non-contrast
Comparison: Abdominal series 10/24/2013.

CLINICAL DATA: Liver disease.

EXAM:
ABDOMEN ULTRASOUND COMPLETE

[Series 1: us abdomen complete · 0.30mm/px · 14 of 78 slices shown]
[im 1/78]
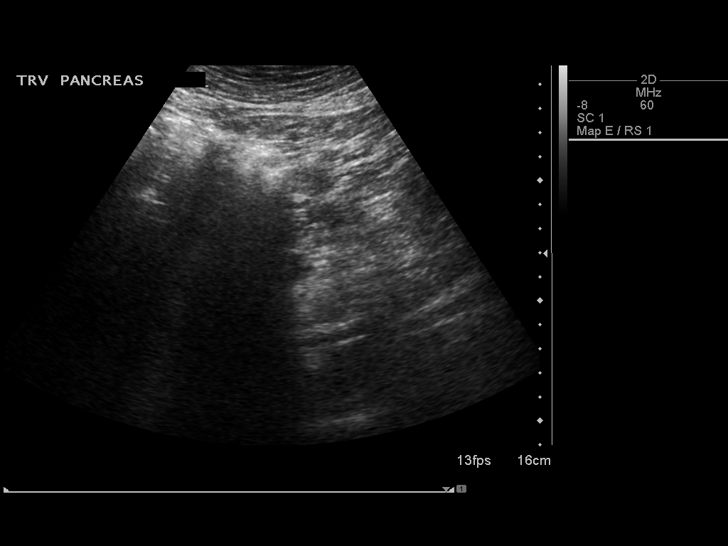
[im 7/78]
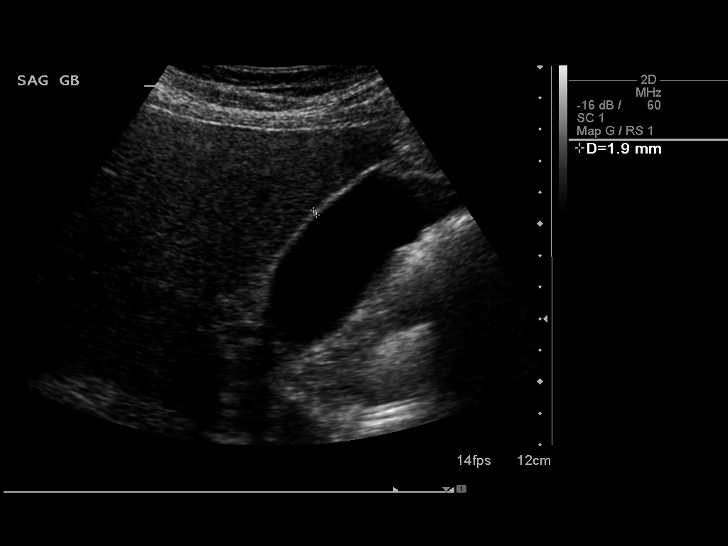
[im 13/78]
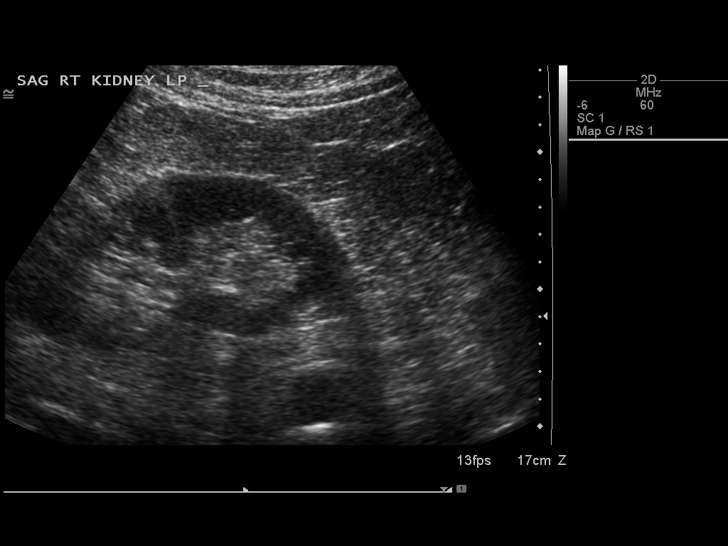
[im 20/78]
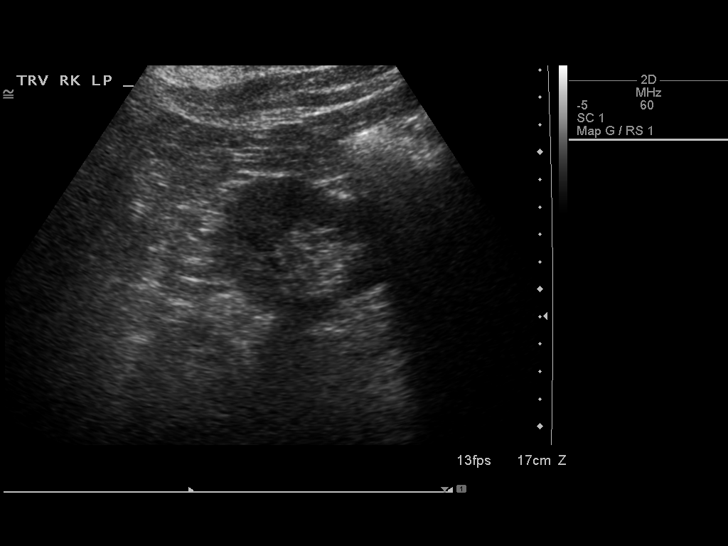
[im 26/78]
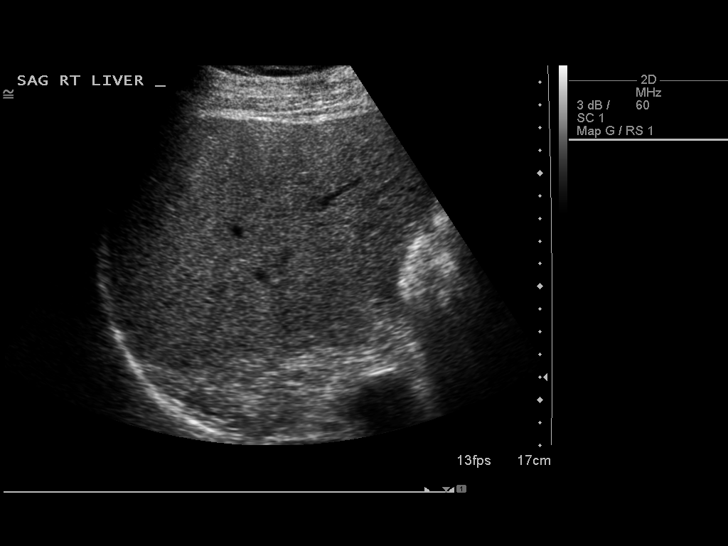
[im 29/78]
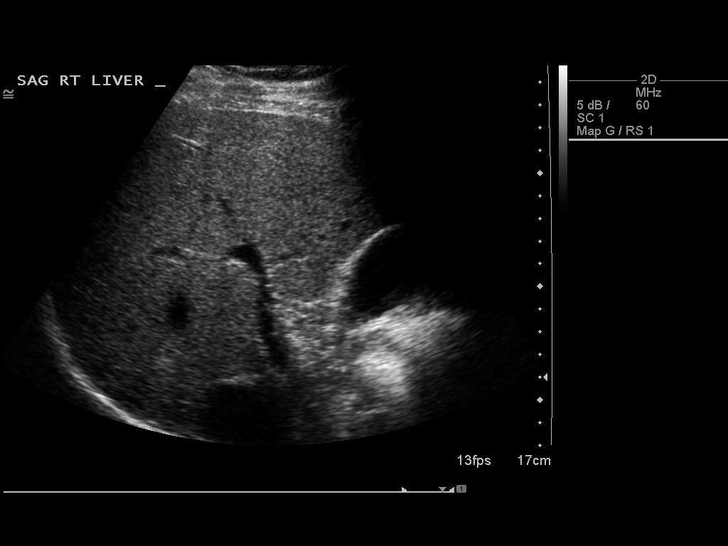
[im 36/78]
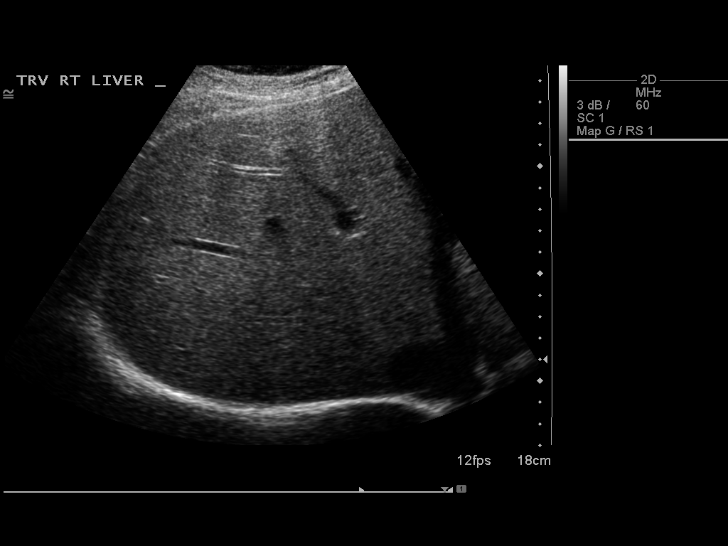
[im 42/78]
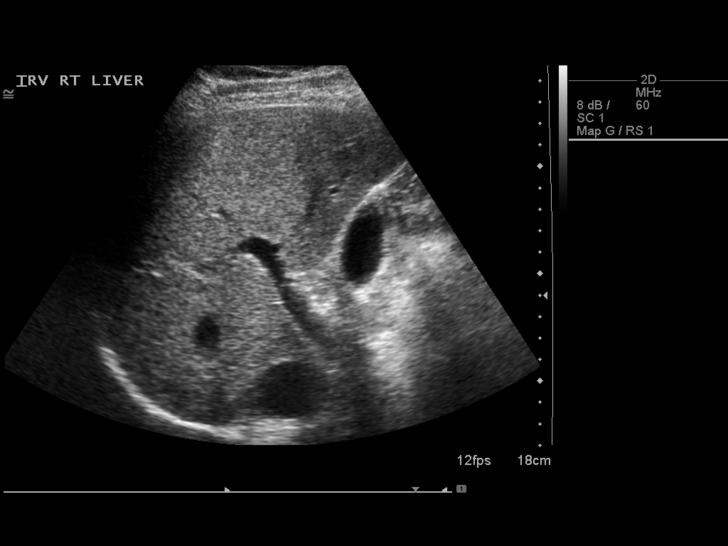
[im 49/78]
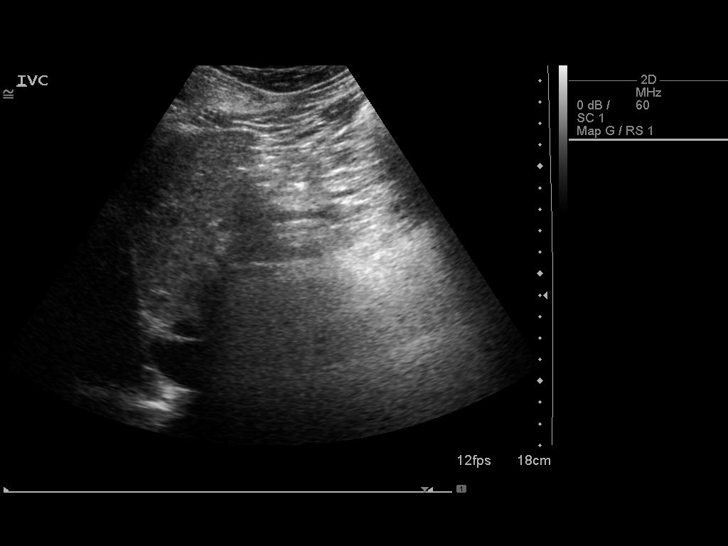
[im 52/78]
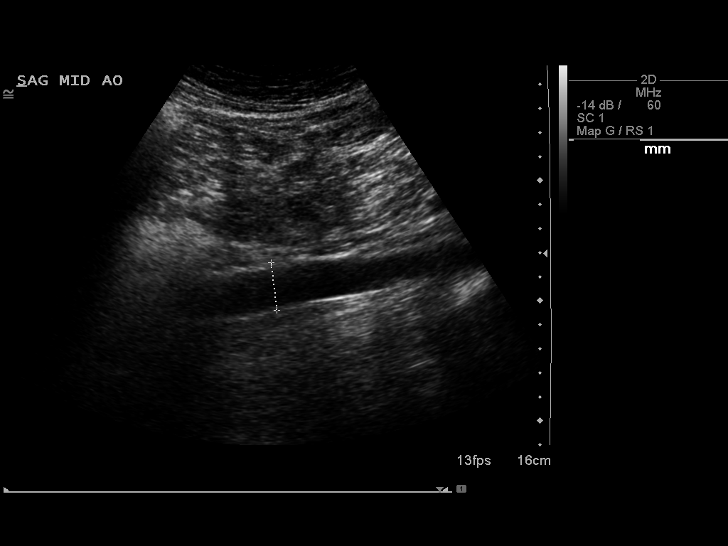
[im 58/78]
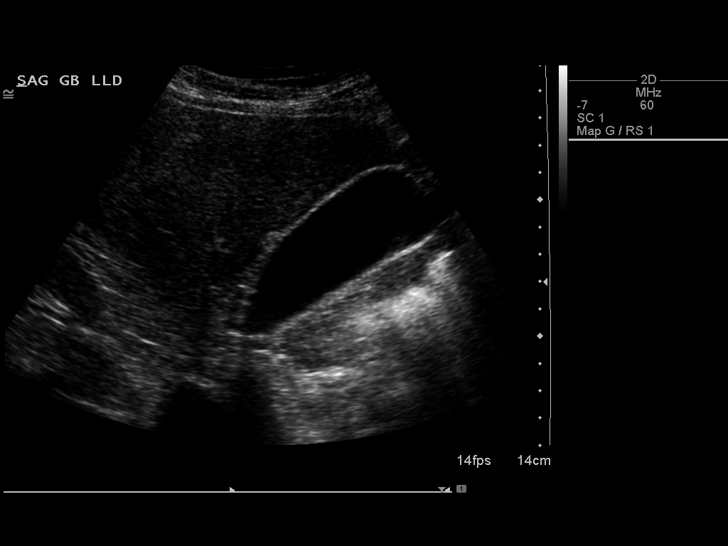
[im 65/78]
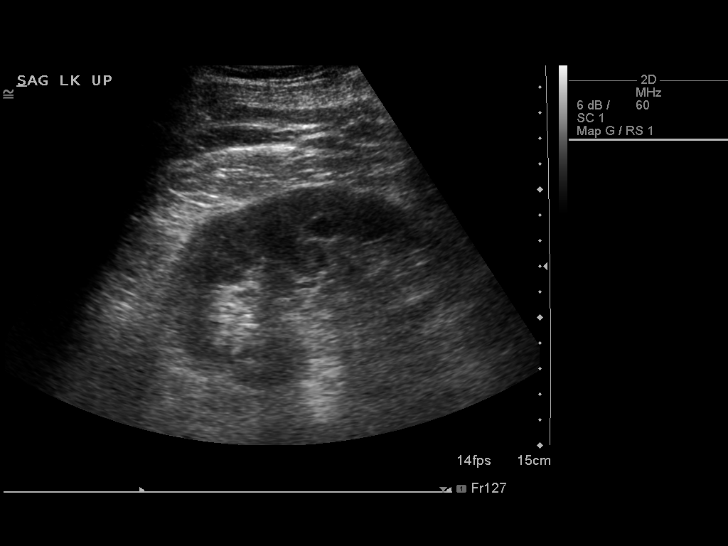
[im 71/78]
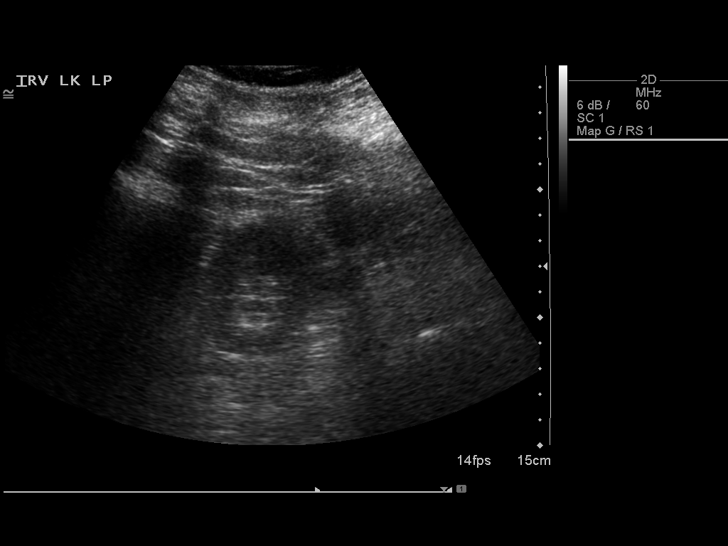
[im 78/78]
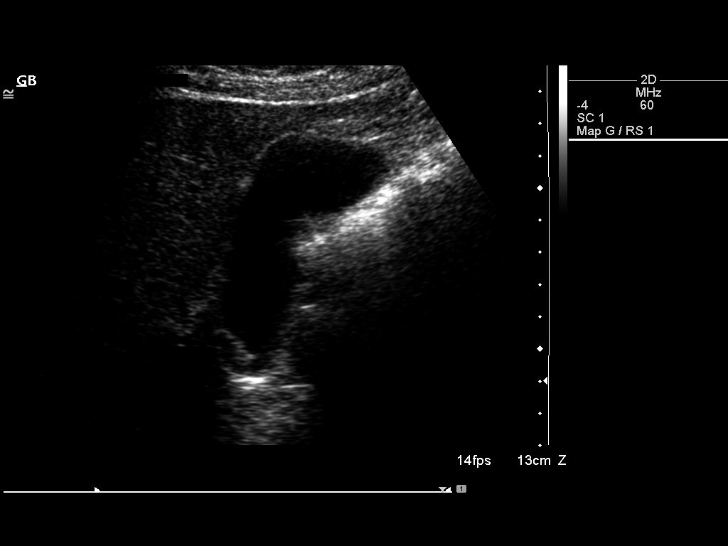

[14 of 25 positions shown; findings below may reference images not displayed]

FINDINGS: Gallbladder: No gallstones or wall thickening visualized. No
sonographic Murphy sign noted by sonographer.

Common bile duct: Diameter: 3.4 mm

Liver: Heterogeneous parenchymal pattern suggesting fatty
infiltration and/or hepatocellular disease.

IVC: No abnormality visualized.

Pancreas: Visualized portion unremarkable.

Spleen: Size and appearance within normal limits.

Right Kidney: Length: 12.6 cm. Echogenicity within normal limits. No
mass or hydronephrosis visualized.

Left Kidney: Length: 13.0 cm. Echogenicity within normal limits. No
mass or hydronephrosis visualized.

Abdominal aorta: No aneurysm visualized.

Other findings: None.
IMPRESSION: Liver has a heterogeneous echotexture suggesting fatty infiltration
and/or hepatocellular disease. Exam is otherwise unremarkable. No
gallstones or biliary distention.

## 2017-09-20 ENCOUNTER — Telehealth: Payer: Self-pay | Admitting: *Deleted

## 2017-09-20 DIAGNOSIS — F1010 Alcohol abuse, uncomplicated: Secondary | ICD-10-CM

## 2017-09-20 DIAGNOSIS — I10 Essential (primary) hypertension: Secondary | ICD-10-CM

## 2017-09-20 DIAGNOSIS — E785 Hyperlipidemia, unspecified: Secondary | ICD-10-CM

## 2017-09-20 NOTE — Telephone Encounter (Signed)
The plan was to check at his next visit. If he prefers to check now too ok to order bmp, cbc, lipid panel. Thanks.

## 2017-09-20 NOTE — Telephone Encounter (Signed)
I called the pt and informed him of the message below.  Patient stated he prefers to have labs done prior to the appt and a lab visit was scheduled for 11/16.

## 2017-09-20 NOTE — Telephone Encounter (Signed)
Copied from CRM [id number]. Topic: General - Other >> Sep 20, 2017  8:04 AM Percival SpanishKennedy, Cheryl W wrote:   Pt call to say he saw Dr Selena BattenKim in Oct and was suppose to have some labs drawn but he had a fever and she told him he come back. He is asking to have lab order placed in Epic and a call back to schedule

## 2017-09-21 ENCOUNTER — Other Ambulatory Visit (INDEPENDENT_AMBULATORY_CARE_PROVIDER_SITE_OTHER): Payer: Managed Care, Other (non HMO)

## 2017-09-21 DIAGNOSIS — E785 Hyperlipidemia, unspecified: Secondary | ICD-10-CM

## 2017-09-21 DIAGNOSIS — I10 Essential (primary) hypertension: Secondary | ICD-10-CM

## 2017-09-21 DIAGNOSIS — F1010 Alcohol abuse, uncomplicated: Secondary | ICD-10-CM | POA: Diagnosis not present

## 2017-09-21 LAB — COMPREHENSIVE METABOLIC PANEL
ALT: 18 U/L (ref 0–53)
AST: 20 U/L (ref 0–37)
Albumin: 4.1 g/dL (ref 3.5–5.2)
Alkaline Phosphatase: 50 U/L (ref 39–117)
BUN: 19 mg/dL (ref 6–23)
CO2: 30 mEq/L (ref 19–32)
Calcium: 9.7 mg/dL (ref 8.4–10.5)
Chloride: 102 mEq/L (ref 96–112)
Creatinine, Ser: 1.1 mg/dL (ref 0.40–1.50)
GFR: 76.7 mL/min (ref >60.00)
Glucose, Bld: 110 mg/dL — ABNORMAL HIGH (ref 70–99)
Potassium: 4.4 mEq/L (ref 3.5–5.1)
Sodium: 138 mEq/L (ref 135–145)
Total Bilirubin: 0.9 mg/dL (ref 0.2–1.2)
Total Protein: 6.6 g/dL (ref 6.0–8.3)

## 2017-09-21 LAB — LIPID PANEL
Cholesterol: 194 mg/dL (ref 0–200)
HDL: 45.8 mg/dL (ref >39.00)
LDL Cholesterol: 129 mg/dL — ABNORMAL HIGH (ref 0–99)
NonHDL: 148.46
Total CHOL/HDL Ratio: 4
Triglycerides: 99 mg/dL (ref 0.0–149.0)
VLDL: 19.8 mg/dL (ref 0.0–40.0)

## 2017-09-21 LAB — BASIC METABOLIC PANEL
BUN: 19 mg/dL (ref 6–23)
CO2: 30 mEq/L (ref 19–32)
Calcium: 9.7 mg/dL (ref 8.4–10.5)
Chloride: 102 mEq/L (ref 96–112)
Creatinine, Ser: 1.1 mg/dL (ref 0.40–1.50)
GFR: 76.7 mL/min (ref >60.00)
Glucose, Bld: 110 mg/dL — ABNORMAL HIGH (ref 70–99)
Potassium: 4.4 mEq/L (ref 3.5–5.1)
Sodium: 138 mEq/L (ref 135–145)

## 2017-09-21 LAB — CBC WITH DIFFERENTIAL/PLATELET
Basophils Absolute: 0 10*3/uL (ref 0.0–0.1)
Basophils Relative: 0.6 % (ref 0.0–3.0)
Eosinophils Absolute: 0.1 10*3/uL (ref 0.0–0.7)
Eosinophils Relative: 0.7 % (ref 0.0–5.0)
HCT: 39.5 % (ref 39.0–52.0)
Hemoglobin: 13.3 g/dL (ref 13.0–17.0)
Lymphocytes Relative: 35.5 % (ref 12.0–46.0)
Lymphs Abs: 2.4 10*3/uL (ref 0.7–4.0)
MCHC: 33.7 g/dL (ref 30.0–36.0)
MCV: 88.1 fl (ref 78.0–100.0)
Monocytes Absolute: 0.4 10*3/uL (ref 0.1–1.0)
Monocytes Relative: 6.2 % (ref 3.0–12.0)
Neutro Abs: 3.9 10*3/uL (ref 1.4–7.7)
Neutrophils Relative %: 57 % (ref 43.0–77.0)
Platelets: 257 10*3/uL (ref 150.0–400.0)
RBC: 4.48 Mil/uL (ref 4.22–5.81)
RDW: 13.9 % (ref 11.5–15.5)
WBC: 6.8 10*3/uL (ref 4.0–10.5)

## 2017-09-21 LAB — PROTIME-INR
INR: 1.3 ratio — ABNORMAL HIGH (ref 0.8–1.0)
Prothrombin Time: 13.7 s — ABNORMAL HIGH (ref 9.6–13.1)

## 2017-09-24 ENCOUNTER — Telehealth: Payer: Self-pay | Admitting: Family Medicine

## 2017-09-24 NOTE — Telephone Encounter (Signed)
Patient informed of the results-see results note.

## 2017-09-24 NOTE — Telephone Encounter (Signed)
Copied from CRM [id number]. Topic: Inquiry >> Sep 24, 2017  2:55 PM Guinevere FerrariMorris, Sharamare E, NT wrote: Reason for CRM: [facility] but no CRM noted

## 2017-11-13 ENCOUNTER — Other Ambulatory Visit: Payer: Self-pay | Admitting: Family Medicine

## 2017-11-15 ENCOUNTER — Encounter: Payer: Self-pay | Admitting: Family Medicine

## 2017-11-22 ENCOUNTER — Other Ambulatory Visit: Payer: Self-pay | Admitting: Family Medicine

## 2017-12-11 NOTE — Progress Notes (Signed)
HPI:  Luke StageBryan T. Hunt is a pleasant 47 y.o. here for follow up. Chronic medical problems summarized below were reviewed for changes and stability and were updated as needed below. These issues and their treatment remain stable for the most part.  Reports is doing well.  Reports he is exercising a lot and is doing some lifting and is building muscle.  He reports he is eating a healthy diet with lots of veggies and plenty of protein.  While he has cut back significantly on his alcohol use and is not drinking during the week, he still consumes about 12 alcoholic beverages on the weekend.  He feels he can cut down further.  He has not had any trouble cutting back.  He sees his gastroenterologist about his liver and his hemorrhoids. He has a new complaint of some dry skin on his feet and a thickened place on one toe, L great toe.  He had a cold the last couple weeks, this is getting much better, still has a little postnasal drip and occasional cough.  Denies CP, SOB, DOE, treatment intolerance or new symptoms.  OSA: - on CPAP  HTN/Obesity/HLD: -meds: losartan, pravastatin -exercising 7 days per week and reports has increase lifting and is building muscle -reports healthy diet -no CP, SOB, swelling -wt 249 10/18 --> 153 (2/19)  Alcohol abuse/elevated liver enzymes/constipation/GERD: -seeing GI, Dr. Kinnie ScalesMedoff -used to drink 42 drinks per week, down to 10-12 drinks on the weekends -he does not feel he needs help or is addicted and plans to continue to cut back -he is reading book on alcoholism to help him with a plan to cut back -meds: omeprazole -denies: abd pain, swelling, difficulty cutting back, vomiting   ROS: See pertinent positives and negatives per HPI.  Past Medical History:  Diagnosis Date  . Chronic constipation    intermittent  . Environmental allergies   . GERD   . Gout   . Hyperlipemia   . Hypertension   . Obesity   . OSA (obstructive sleep apnea) 05/23/2012   on CPAP  .  Syncope    related to blood draws, stitches    Past Surgical History:  Procedure Laterality Date  . APPENDECTOMY    . Rigth knee arthroscopy     @ [facility] [facility]  . VASECTOMY     10/2017-[facility]-per patient    Family History  Problem Relation Age of Onset  . Stroke Maternal Grandmother   . Diabetes Mother   . Breast cancer Mother   . Heart disease Maternal Grandfather   . Diabetes Maternal Grandfather   . Cystic fibrosis Maternal Aunt   . Diabetes Brother     Social History   Socioeconomic History  . Marital status: Married    Spouse name: None  . Number of children: 2  . Years of education: None  . Highest education level: None  Social Needs  . Financial resource strain: None  . Food insecurity - worry: None  . Food insecurity - inability: None  . Transportation needs - medical: None  . Transportation needs - non-medical: None  Occupational History  . Occupation: Production designer, theatre/television/filmmanager distribution  Tobacco Use  . Smoking status: Former Smoker    Years: 6.00    Types: Cigarettes    Last attempt to quit: 02/15/2006    Years since quitting: 11.8  . Smokeless tobacco: Never Used  Substance and Sexual Activity  . Alcohol use: Yes    Alcohol/week: 8.4 oz    Types: 14  Standard drinks or equivalent per week    Comment: 2 per day  . Drug use: No  . Sexual activity: None  Other Topics Concern  . 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  losartan (COZAAR) 50 MG tablet, TAKE 1 TABLET TWICE A DAY, Disp: 180 tablet, Rfl: 1 .  Omega-3 Fatty Acids (FISH OIL PO), Take by mouth., Disp: , Rfl:  .  omeprazole (PRILOSEC OTC) 20 MG tablet, Take 20 mg by mouth daily., Disp: , Rfl:  .  Plecanatide (TRULANCE PO), Take by mouth. As needed, Disp: , Rfl:    EXAM:  Vitals:   12/13/17 0822  BP: 110/80  Pulse: 70  Temp: 98.1 F (36.7 C)    Body mass index is 33.87 kg/m.   GENERAL: vitals reviewed and listed above, alert, oriented, appears well hydrated and in no acute distress  HEENT: atraumatic, conjunttiva clear, no obvious abnormalities on inspection of external nose and ears, normal appearance of ear canals and TMs, clear nasal congestion, mild post oropharyngeal erythema with PND, no tonsillar edema or exudate, no sinus TTP  NECK: no obvious masses on inspection  LUNGS: clear to auscultation bilaterally, no wheezes, rales or rhonchi, good air movement  CV: HRRR, no peripheral edema  MS: moves all extremities without noticeable abnormality  SKIN: mild scaly skin feet, callus great toe lateral L  PSYCH: pleasant and cooperative, no obvious depression or anxiety  ASSESSMENT AND PLAN:  Discussed the following assessment and plan:  Hyperlipidemia, unspecified hyperlipidemia type Essential hypertension Class 1 obesity with serious comorbidity and body mass index (BMI) of 33.0 to 33.9 in adult, unspecified obesity type -Continue current treatment and advised weight reduction, healthy diet and regular exercise -Related on current healthy lifestyle changes  OSA (obstructive sleep apnea) -Stable  Alcohol abuse -Congratulated on changes and advised to cut back further, advised a goal of cutting back drinks per weekend by the next visit -possible, have advised complete abstinence, but he does not want to do this, has declined formal help for this  Tinea pedis of both feet -Topical treatments for this  Callus of foot -Discussed topical treatments and footwear  Post-viral cough syndrome -Lungs sound good, advised to call if not cleared up in the next week and would do chest x-ray  -Patient advised to return or notify a doctor immediately if symptoms worsen or persist or new concerns arise.  He declined AVS.  There are  no Patient Instructions on file for this visit.  Terressa Koyanagi, DO

## 2017-12-13 ENCOUNTER — Encounter: Payer: Self-pay | Admitting: Family Medicine

## 2017-12-13 ENCOUNTER — Ambulatory Visit (INDEPENDENT_AMBULATORY_CARE_PROVIDER_SITE_OTHER): Payer: Managed Care, Other (non HMO) | Admitting: Family Medicine

## 2017-12-13 VITALS — BP 110/80 | HR 70 | Temp 98.1°F | Ht 72.5 in | Wt 253.2 lb

## 2017-12-13 DIAGNOSIS — L84 Corns and callosities: Secondary | ICD-10-CM | POA: Diagnosis not present

## 2017-12-13 DIAGNOSIS — R05 Cough: Secondary | ICD-10-CM | POA: Diagnosis not present

## 2017-12-13 DIAGNOSIS — F1010 Alcohol abuse, uncomplicated: Secondary | ICD-10-CM

## 2017-12-13 DIAGNOSIS — E669 Obesity, unspecified: Secondary | ICD-10-CM

## 2017-12-13 DIAGNOSIS — E785 Hyperlipidemia, unspecified: Secondary | ICD-10-CM | POA: Diagnosis not present

## 2017-12-13 DIAGNOSIS — G4733 Obstructive sleep apnea (adult) (pediatric): Secondary | ICD-10-CM | POA: Diagnosis not present

## 2017-12-13 DIAGNOSIS — Z6833 Body mass index (BMI) 33.0-33.9, adult: Secondary | ICD-10-CM

## 2017-12-13 DIAGNOSIS — B353 Tinea pedis: Secondary | ICD-10-CM

## 2017-12-13 DIAGNOSIS — I10 Essential (primary) hypertension: Secondary | ICD-10-CM

## 2017-12-13 DIAGNOSIS — R058 Other specified cough: Secondary | ICD-10-CM

## 2018-03-12 ENCOUNTER — Encounter: Payer: Managed Care, Other (non HMO) | Admitting: Family Medicine

## 2018-04-08 LAB — HM COLONOSCOPY

## 2018-04-22 NOTE — Progress Notes (Signed)
HPI:  Using dictation device. Unfortunately this device frequently misinterprets words/phrases.  Here for CPE:  -Concerns and/or follow up today:   Luke Hunt is a pleasant 47 y.o. here for follow up. Chronic medical problems summarized below were reviewed for changes.  He has been eating healthy and  exercising on a regular basis.  He wants his lab work to follow-up on his hypertension, cholesterol and his kidney disease.  He also wants a hemoglobin A1c to keep tabs on his blood sugar.  Denies CP, SOB, DOE, treatment intolerance or new symptoms. Reports he continues to cut back on alcohol and now is down to 10 drinks on the weekends.  He does not feel he will ever quit completely, but continues to cut back.  He enjoys drinking.  He requests that we do PT/INR and CMP so that he can take these results to his gastroenterologist whom is monitoring his liver disease.  Reports he will see Dr. Lottie Mussel in a few weeks. He is currently seeing a specialist about some low back pain and had an MRI.  He has some facet arthropathy and is doing physical therapy for this. Due for labs.  OSA: - on CPAP  HTN/Obesity/HLD: -meds: losartan, pravastatin -exercising 7 days per week and reports has increase lifting and is building muscle -reports healthy diet -no CP, SOB, swelling -wt 249 10/18 --> 253 (2/19) -->  251 (6/19)  Alcohol abuse/elevated liver enzymes/constipation/GERD: -seeing GI, Dr. Kinnie Scales -used to drink 42 drinks per week, down to10 drinks on the weekends -he does not feel he needs help or is addicted and plans to continue to cut back -he is reading book on alcoholism to help him with a plan to cut back -meds: omeprazole -denies: abd pain, swelling, difficulty cutting back, vomiting  -Diet: variety of foods, balance and well rounded -Exercise:  regular exercise -Diabetes and Dyslipidemia Screening:fasting for labs -Vaccines: UTD -sexual activity: yes, male partner, no new  partners -wants STI testing, Hep C screening (if born [birth year]):wants STI screening per orders -FH colon or prstate ca: see FH Last colon cancer screening: n/a  Last prostate ca screening:n/a -Alcohol, Tobacco, drug use: see social history  Review of Systems - no fevers, unintentional weight loss, vision loss, hearing loss, chest pain, sob, hemoptysis, melena, hematochezia, hematuria, genital discharge, changing or concerning skin lesions, bleeding, bruising, loc, thoughts of self harm or SI  Past Medical History:  Diagnosis Date  . Chronic constipation    intermittent  . Environmental allergies   . GERD   . Gout   . Hyperlipemia   . Hypertension   . Obesity   . OSA (obstructive sleep apnea) 05/23/2012   on CPAP  . Syncope    related to blood draws, stitches    Past Surgical History:  Procedure Laterality Date  . APPENDECTOMY    . Rigth knee arthroscopy     @ [place] [facility]  . VASECTOMY     10/2017-[facility]-per patient    Family History  Problem Relation Age of Onset  . Stroke Maternal Grandmother   . Diabetes Mother   . Breast cancer Mother   . Heart disease Maternal Grandfather   . Diabetes Maternal Grandfather   . Cystic fibrosis Maternal Aunt   . Diabetes Brother     Social History   Socioeconomic History  . Marital status: Married    Spouse name: Not on file  . Number of children: 2  . Years of education: Not on file  .  Highest education level: Not on file  Occupational History  . Occupation: Optician, dispensingmanager distribution  Social Needs  . Financial resource strain: Not on file  . Food insecurity:    Worry: Not on file    Inability: Not on file  . Transportation needs:    Medical: Not on file    Non-medical: Not on file  Tobacco Use  . Smoking status: Former Smoker    Years: 6.00    Types: Cigarettes    Last attempt to quit: 02/15/2006    Years since quitting: 12.1  . Smokeless tobacco: Never Used  Substance and Sexual Activity  . Alcohol use: Yes     Alcohol/week: 8.4 oz    Types: 14 Standard drinks or equivalent per week    Comment: 2 per day  . Drug use: No  . Sexual activity: Not on file  Lifestyle  . Physical activity:    Days per week: Not on file    Minutes per session: Not on file  . Stress: Not on file  Relationships  . Social connections:    Talks on phone: Not on file    Gets together: Not on file    Attends religious service: Not on file    Active member of club or organization: Not on file    Attends meetings of clubs or organizations: Not on file    Relationship status: Not on file  Other Topics Concern  . Not on file  Social History Narrative   Work or School: Oncologistpackaging company manager      Home Situation: lives with wife and daughter and son      Spiritual Beliefs: Christian      Lifestyle: regular exercise - heavy lifter; diet poor        Current Outpatient Medications:  .  losartan (COZAAR) 50 MG tablet, TAKE 1 TABLET TWICE A DAY, Disp: 180 tablet, Rfl: 1 .  Omega-3 Fatty Acids (FISH OIL PO), Take by mouth., Disp: , Rfl:  .  omeprazole (PRILOSEC OTC) 20 MG tablet, Take 20 mg by mouth daily., Disp: , Rfl:  .  Plecanatide (TRULANCE PO), Take by mouth. As needed, Disp: , Rfl:   EXAM:  Vitals:   04/23/18 0912  BP: 124/78  Pulse: 60  Temp: 98.5 F (36.9 C)  TempSrc: Oral  SpO2: 98%  Weight: 251 lb 8 oz (114.1 kg)  Height: 6' 0.5" (1.842 m)    Estimated body mass index is 33.64 kg/m as calculated from the following:   Height as of this encounter: 6' 0.5" (1.842 m).   Weight as of this encounter: 251 lb 8 oz (114.1 kg).  GENERAL: vitals reviewed and listed below, alert, oriented, appears well hydrated and in no acute distress  HEENT: head atraumatic, PERRLA, normal appearance of eyes, ears, nose and mouth. moist mucus membranes.  NECK: supple, no masses or lymphadenopathy  LUNGS: clear to auscultation bilaterally, no rales, rhonchi or wheeze  CV: HRRR, no peripheral edema or cyanosis,  normal pedal pulses  ABDOMEN: bowel sounds normal, soft, non tender to palpation, no masses, no rebound or guarding  GU: deferred  SKIN: no rash or abnormal lesions  MS: normal gait, moves all extremities normally  NEURO: normal gait, speech and thought processing grossly intact, muscle tone grossly intact throughout  PSYCH: normal affect, pleasant and cooperative  ASSESSMENT AND PLAN:  Discussed the following assessment and plan:  PREVENTIVE EXAM: -Discussed and advised all [place] preventive services health task force level A and B  recommendations for age, sex and risks. -Advised at least 150 minutes of exercise per week and a healthy diet with avoidance of (less then 1 serving per week) processed foods, white starches, red meat, fast foods and sweets and consisting of: * 5-9 servings of fresh fruits and vegetables (not corn or potatoes) *nuts and seeds, beans *olives and olive oil *lean meats such as fish and white chicken  *whole grains -labs, studies and vaccines per orders this encounter - Hemoglobin A1c  2. Essential hypertension -cont current tx and cont to cut back on alcohol - CBC - Comprehensive metabolic panel  3. Hyperlipidemia, unspecified hyperlipidemia type - Lipid panel -advised super clean diet, regular exercise and.cont to cut back on alcohol  4. Transaminitis -advised complete alcohol cessation -seeing GI for management - he requested CMP and coag to forward to GI - Protime-INR  5. Alcohol liver damage (HCC) -see above, advised complete alcohol cessation -he is cutting back -he does not want to cut back completely -advised of significant risks of continued use, offered help  6. Routine screening for STI (sexually transmitted infection) - HIV antibody - RPR - GC/Chlamydia Probe Amp([facility])   There are no Patient Instructions on file for this visit.  No follow-ups on file.   Terressa Koyanagi, DO

## 2018-04-23 ENCOUNTER — Encounter: Payer: Self-pay | Admitting: Family Medicine

## 2018-04-23 ENCOUNTER — Ambulatory Visit (INDEPENDENT_AMBULATORY_CARE_PROVIDER_SITE_OTHER): Payer: Managed Care, Other (non HMO) | Admitting: Family Medicine

## 2018-04-23 VITALS — BP 124/78 | HR 60 | Temp 98.5°F | Ht 72.5 in | Wt 251.5 lb

## 2018-04-23 DIAGNOSIS — Z0000 Encounter for general adult medical examination without abnormal findings: Secondary | ICD-10-CM

## 2018-04-23 DIAGNOSIS — R740 Nonspecific elevation of levels of transaminase and lactic acid dehydrogenase [LDH]: Secondary | ICD-10-CM | POA: Diagnosis not present

## 2018-04-23 DIAGNOSIS — E785 Hyperlipidemia, unspecified: Secondary | ICD-10-CM

## 2018-04-23 DIAGNOSIS — R7401 Elevation of levels of liver transaminase levels: Secondary | ICD-10-CM

## 2018-04-23 DIAGNOSIS — K709 Alcoholic liver disease, unspecified: Secondary | ICD-10-CM

## 2018-04-23 DIAGNOSIS — Z113 Encounter for screening for infections with a predominantly sexual mode of transmission: Secondary | ICD-10-CM

## 2018-04-23 DIAGNOSIS — I10 Essential (primary) hypertension: Secondary | ICD-10-CM | POA: Diagnosis not present

## 2018-04-23 LAB — COMPREHENSIVE METABOLIC PANEL
ALT: 19 U/L (ref 0–53)
AST: 18 U/L (ref 0–37)
Albumin: 4.2 g/dL (ref 3.5–5.2)
Alkaline Phosphatase: 39 U/L (ref 39–117)
BUN: 17 mg/dL (ref 6–23)
CO2: 30 mEq/L (ref 19–32)
Calcium: 9.5 mg/dL (ref 8.4–10.5)
Chloride: 104 mEq/L (ref 96–112)
Creatinine, Ser: 1.09 mg/dL (ref 0.40–1.50)
GFR: 77.31 mL/min (ref >60.00)
Glucose, Bld: 112 mg/dL — ABNORMAL HIGH (ref 70–99)
Potassium: 4.2 mEq/L (ref 3.5–5.1)
Sodium: 140 mEq/L (ref 135–145)
Total Bilirubin: 0.8 mg/dL (ref 0.2–1.2)
Total Protein: 6.6 g/dL (ref 6.0–8.3)

## 2018-04-23 LAB — LIPID PANEL
Cholesterol: 203 mg/dL — ABNORMAL HIGH (ref 0–200)
HDL: 50 mg/dL (ref >39.00)
LDL Cholesterol: 131 mg/dL — ABNORMAL HIGH (ref 0–99)
NonHDL: 153
Total CHOL/HDL Ratio: 4
Triglycerides: 109 mg/dL (ref 0.0–149.0)
VLDL: 21.8 mg/dL (ref 0.0–40.0)

## 2018-04-23 LAB — CBC
HCT: 38.1 % — ABNORMAL LOW (ref 39.0–52.0)
Hemoglobin: 12.9 g/dL — ABNORMAL LOW (ref 13.0–17.0)
MCHC: 33.9 g/dL (ref 30.0–36.0)
MCV: 89.1 fl (ref 78.0–100.0)
Platelets: 193 10*3/uL (ref 150.0–400.0)
RBC: 4.27 Mil/uL (ref 4.22–5.81)
RDW: 13.8 % (ref 11.5–15.5)
WBC: 5.1 10*3/uL (ref 4.0–10.5)

## 2018-04-23 LAB — PROTIME-INR
INR: 1.1 ratio — ABNORMAL HIGH (ref 0.8–1.0)
Prothrombin Time: 13.2 s — ABNORMAL HIGH (ref 9.6–13.1)

## 2018-04-23 LAB — HEMOGLOBIN A1C: Hgb A1c MFr Bld: 5.9 % (ref 4.6–6.5)

## 2018-04-23 NOTE — Addendum Note (Signed)
Addended by: Charna ElizabethLEMMONS, Holden Maniscalco L on: 04/23/2018 09:52 AM   Modules accepted: Orders

## 2018-04-23 NOTE — Patient Instructions (Signed)
BEFORE YOU LEAVE: -labs -follow up:4 months  We have ordered labs or studies at this visit. It can take up to 1-2 weeks for results and processing. IF results require follow up or explanation, we will call you with instructions. Clinically stable results will be released to your [facility]. If you have not heard from [place] or cannot find your results in [facility] in 2 weeks please contact our office at [phone].  If you are not yet signed up for [facility], please consider signing up.  Please quit drinking.   We recommend the following healthy lifestyle for LIFE: 1) Small portions. But, make sure to get regular (at least 3 per day), healthy meals and small healthy snacks if needed.  2) Eat a healthy clean diet.   TRY TO EAT: -at least 5-7 servings of low sugar, colorful, and nutrient rich vegetables per day (not corn, potatoes or bananas.) -berries are the best choice if you wish to eat fruit (only eat small amounts if trying to reduce weight)  -lean meets (fish, white meat of chicken or [place]) -vegan proteins for some meals - beans or tofu, whole grains, nuts and seeds -Replace bad fats with good fats - good fats include: fish, nuts and seeds, canola oil, olive oil -small amounts of low fat or non fat dairy -small amounts of100 % whole grains - check the lables -drink plenty of water  AVOID: -SUGAR, sweets, anything with added sugar, corn syrup or sweeteners - must read labels as even foods advertised as "healthy" often are loaded with sugar -if you must have a sweetener, small amounts of stevia may be best -sweetened beverages and artificially sweetened beverages -simple starches (rice, bread, potatoes, pasta, chips, etc - small amounts of 100% whole grains are ok) -red meat, pork, butter -fried foods, fast food, processed food, excessive dairy, eggs and coconut.  3)Get at least 150 minutes of sweaty aerobic exercise per week.  4)Reduce stress - consider counseling, meditation and  relaxation to balance other aspects of your life.   Preventive Care 40-64 Years, Male Preventive care refers to lifestyle choices and visits with your health care provider that can promote health and wellness. What does preventive care include?  A yearly physical exam. This is also called an annual well check.  Dental exams once or twice a year.  Routine eye exams. Ask your health care provider how often you should have your eyes checked.  Personal lifestyle choices, including: ? Daily care of your teeth and gums. ? Regular physical activity. ? Eating a healthy diet. ? Avoiding tobacco and drug use. ? Limiting alcohol use. ? Practicing safe sex. ? Taking low-dose aspirin every day starting at age 75. What happens during an annual well check? The services and screenings done by your health care provider during your annual well check will depend on your age, overall health, lifestyle risk factors, and family history of disease. Counseling Your health care provider may ask you questions about your:  Alcohol use.  Tobacco use.  Drug use.  Emotional well-being.  Home and relationship well-being.  Sexual activity.  Eating habits.  Work and work Statistician.  Screening You may have the following tests or measurements:  Height, weight, and BMI.  Blood pressure.  Lipid and cholesterol levels. These may be checked every 5 years, or more frequently if you are over 1 years old.  Skin check.  Lung cancer screening. You may have this screening every year starting at age 53 if you have a 30-pack-year  history of smoking and currently smoke or have quit within the past 15 years.  Fecal occult blood test (FOBT) of the stool. You may have this test every year starting at age 71.  Flexible sigmoidoscopy or colonoscopy. You may have a sigmoidoscopy every 5 years or a colonoscopy every 10 years starting at age 52.  Prostate cancer screening. Recommendations will vary depending  on your family history and other risks.  Hepatitis C blood test.  Hepatitis B blood test.  Sexually transmitted disease (STD) testing.  Diabetes screening. This is done by checking your blood sugar (glucose) after you have not eaten for a while (fasting). You may have this done every 1-3 years.  Discuss your test results, treatment options, and if necessary, the need for more tests with your health care provider. Vaccines Your health care provider may recommend certain vaccines, such as:  Influenza vaccine. This is recommended every year.  Tetanus, diphtheria, and acellular pertussis (Tdap, Td) vaccine. You may need a Td booster every 10 years.  Varicella vaccine. You may need this if you have not been vaccinated.  Zoster vaccine. You may need this after age 53.  Measles, mumps, and rubella (MMR) vaccine. You may need at least one dose of MMR if you were born in 1957 or later. You may also need a second dose.  Pneumococcal 13-valent conjugate (PCV13) vaccine. You may need this if you have certain conditions and have not been vaccinated.  Pneumococcal polysaccharide (PPSV23) vaccine. You may need one or two doses if you smoke cigarettes or if you have certain conditions.  Meningococcal vaccine. You may need this if you have certain conditions.  Hepatitis A vaccine. You may need this if you have certain conditions or if you travel or work in places where you may be exposed to hepatitis A.  Hepatitis B vaccine. You may need this if you have certain conditions or if you travel or work in places where you may be exposed to hepatitis B.  Haemophilus influenzae type b (Hib) vaccine. You may need this if you have certain risk factors.  Talk to your health care provider about which screenings and vaccines you need and how often you need them. This information is not intended to replace advice given to you by your health care provider. Make sure you discuss any questions you have with your  health care provider. Document Released: 11/19/2015 Document Revised: 07/12/2016 Document Reviewed: 08/24/2015 Elsevier Interactive Patient Education  [employer].

## 2018-04-24 LAB — RPR: RPR Ser Ql: NONREACTIVE

## 2018-04-24 LAB — C. TRACHOMATIS/N. GONORRHOEAE RNA
C. trachomatis RNA, TMA: NOT DETECTED
N. gonorrhoeae RNA, TMA: NOT DETECTED

## 2018-04-24 LAB — HIV ANTIBODY (ROUTINE TESTING W REFLEX): HIV 1&2 Ab, 4th Generation: NONREACTIVE

## 2018-05-19 ENCOUNTER — Other Ambulatory Visit: Payer: Self-pay | Admitting: Family Medicine

## 2018-05-24 ENCOUNTER — Encounter: Payer: Self-pay | Admitting: Family Medicine

## 2018-08-17 ENCOUNTER — Other Ambulatory Visit: Payer: Self-pay | Admitting: Family Medicine

## 2018-11-16 ENCOUNTER — Other Ambulatory Visit: Payer: Self-pay | Admitting: Family Medicine

## 2019-02-22 ENCOUNTER — Other Ambulatory Visit: Payer: Self-pay | Admitting: Family Medicine

## 2019-02-23 ENCOUNTER — Other Ambulatory Visit: Payer: Self-pay

## 2019-02-23 ENCOUNTER — Ambulatory Visit (HOSPITAL_COMMUNITY)
Admission: EM | Admit: 2019-02-23 | Discharge: 2019-02-23 | Disposition: A | Discharge status: Home / Self Care | Payer: Managed Care, Other (non HMO) | Attending: Family Medicine | Admitting: Family Medicine

## 2019-02-23 ENCOUNTER — Encounter (HOSPITAL_COMMUNITY): Payer: Self-pay | Admitting: Emergency Medicine

## 2019-02-23 DIAGNOSIS — Z23 Encounter for immunization: Secondary | ICD-10-CM

## 2019-02-23 DIAGNOSIS — S61211A Laceration without foreign body of left index finger without damage to nail, initial encounter: Secondary | ICD-10-CM | POA: Diagnosis not present

## 2019-02-23 DIAGNOSIS — S61213A Laceration without foreign body of left middle finger without damage to nail, initial encounter: Secondary | ICD-10-CM | POA: Diagnosis not present

## 2019-02-23 DIAGNOSIS — Y93H2 Activity, gardening and landscaping: Secondary | ICD-10-CM

## 2019-02-23 DIAGNOSIS — W269XXA Contact with unspecified sharp object(s), initial encounter: Secondary | ICD-10-CM | POA: Diagnosis not present

## 2019-02-23 MED ORDER — TETANUS-DIPHTH-ACELL PERTUSSIS 5-2.5-18.5 LF-MCG/0.5 IM SUSP
INTRAMUSCULAR | Status: AC
  Filled 2019-02-23: qty 0.5

## 2019-02-23 MED ORDER — TETANUS-DIPHTH-ACELL PERTUSSIS 5-2.5-18.5 LF-MCG/0.5 IM SUSP
0.5000 mL | Freq: Once | INTRAMUSCULAR | Status: AC
  Administered 2019-02-23: 0.5 mL via INTRAMUSCULAR

## 2019-02-23 NOTE — ED Triage Notes (Signed)
Injury to left index and middle finger, injured trimming bushes.  Laceration to middle and index finger, bleeding controlled

## 2019-02-23 NOTE — ED Provider Notes (Signed)
[facility]    CSN: [id number] Arrival date & time: 02/23/19  1704     History   Chief Complaint Chief Complaint  Patient presents with  . Laceration    HPI Luke Hunt is a 48 y.o. male history of hypertension, hyperlipidemia presenting today for laceration to his left index and left middle finger.  He injured this less than an hour ago while trimming bushes.  He denies difficulty bending fingers.  Denies numbness or tingling.  Unsure of last tetanus.  HPI  Past Medical History:  Diagnosis Date  . Chronic constipation    intermittent  . Environmental allergies   . GERD   . Gout   . Hyperlipemia   . Hypertension   . Obesity   . OSA (obstructive sleep apnea) 05/23/2012   on CPAP  . Syncope    related to blood draws, stitches    Patient Active Problem List   Diagnosis Date Noted  . Constipation 03/20/2016  . Alcohol abuse 03/20/2016  . OSA (obstructive sleep apnea) 05/23/2012  . ALLERGIC RHINITIS 01/09/2008  . Obesity 07/05/2007  . GERD 07/05/2007  . Hyperlipemia 07/04/2007  . Essential hypertension 07/04/2007    Past Surgical History:  Procedure Laterality Date  . APPENDECTOMY    . Rigth knee arthroscopy     @ [place] [facility]  . VASECTOMY     10/2017-[facility]-per patient       Home Medications    Prior to Admission medications   Medication Sig Start Date End Date Taking? Authorizing Provider  losartan (COZAAR) 50 MG tablet TAKE 1 TABLET BY MOUTH TWICE A DAY 11/18/18  Yes Kriste Basque R, DO  Omega-3 Fatty Acids (FISH OIL PO) Take by mouth.   Yes [provider]  omeprazole (PRILOSEC OTC) 20 MG tablet Take 20 mg by mouth daily.    [provider]  Plecanatide (TRULANCE PO) Take by mouth. As needed    [provider]    Family History Family History  Problem Relation Age of Onset  . Stroke Maternal Grandmother   . Diabetes Mother   . Breast cancer Mother   . Heart disease Maternal Grandfather   .  Diabetes Maternal Grandfather   . Cystic fibrosis Maternal Aunt   . Diabetes Brother     Social History Social History   Tobacco Use  . Smoking status: Former Smoker    Years: 6.00    Types: Cigarettes    Last attempt to quit: 02/15/2006    Years since quitting: 13.0  . Smokeless tobacco: Never Used  Substance Use Topics  . Alcohol use: Yes    Alcohol/week: 14.0 standard drinks    Types: 14 Standard drinks or equivalent per week    Comment: 2 per day  . Drug use: No     Allergies   Patient has no known allergies.   Review of Systems Review of Systems  Constitutional: Negative for fatigue and fever.  Eyes: Negative for redness, itching and visual disturbance.  Respiratory: Negative for shortness of breath.   Cardiovascular: Negative for chest pain and leg swelling.  Gastrointestinal: Negative for nausea and vomiting.  Musculoskeletal: Negative for arthralgias and myalgias.  Skin: Positive for wound. Negative for color change and rash.  Neurological: Negative for dizziness, syncope, weakness, light-headedness and headaches.     Physical Exam Triage Vital Signs ED Triage Vitals  Enc Vitals Group     BP 02/23/19 1709 120/78     Pulse Rate 02/23/19 1709  62     Resp 02/23/19 1709 18     Temp 02/23/19 1709 98 F (36.7 C)     Temp Source 02/23/19 1709 Oral     SpO2 02/23/19 1709 97 %     Weight --      Height --      Head Circumference --      Peak Flow --      Pain Score 02/23/19 1707 2     Pain Loc --      Pain Edu? --      Excl. in GC? --    No data found.  Updated Vital Signs BP 120/78 (BP Location: Right Arm) Comment (BP Location): large cuff  Pulse 62   Temp 98 F (36.7 C) (Oral)   Resp 18   SpO2 97%   Visual Acuity Right Eye Distance:   Left Eye Distance:   Bilateral Distance:    Right Eye Near:   Left Eye Near:    Bilateral Near:     Physical Exam Vitals signs and nursing note reviewed.  Constitutional:      Appearance: He is  well-developed.     Comments: No acute distress  HENT:     Head: Normocephalic and atraumatic.     Nose: Nose normal.  Eyes:     Conjunctiva/sclera: Conjunctivae normal.  Neck:     Musculoskeletal: Neck supple.  Cardiovascular:     Rate and Rhythm: Normal rate.  Pulmonary:     Effort: Pulmonary effort is normal. No respiratory distress.  Abdominal:     General: There is no distension.  Musculoskeletal: Normal range of motion.  Skin:    General: Skin is warm and dry.     Comments: Left middle finger with 1 cm superficial well reapproximated wound, bleeding controlled, minimally deep  Left index finger with T-shaped laceration also relatively many light deep, bleeding slightly to inferior aspect, well reapproximated  Neurological:     Mental Status: He is alert and oriented to person, place, and time.      UC Treatments / Results  Labs (all labs ordered are listed, but only abnormal results are displayed) Labs Reviewed - No data to display  EKG None  Radiology No results found.  Procedures Procedures (including critical care time) Wounds cleansed for approximately 10 minutes with warm soapy water.  Dried well and applied Dermabond.  Medications Ordered in UC Medications  Tdap (BOOSTRIX) injection 0.5 mL (0.5 mLs Intramuscular Given 02/23/19 1734)    Initial Impression / Assessment and Plan / UC Course  I have reviewed the triage vital signs and the nursing notes.  Pertinent labs & imaging results that were available during my care of the patient were reviewed by me and considered in my medical decision making (see chart for details).     Tetanus updated, lacerations repaired with Dermabond.  Do not feel suturing would need to any improved healing or scarring.  Monitor for signs of infection.  Keep clean and dry.  Avoid trauma to this area.  Follow-up if developing signs of infection.Discussed strict return precautions. Patient verbalized understanding and is  agreeable with plan.  Final Clinical Impressions(s) / UC Diagnoses   Final diagnoses:  Laceration of left index finger without foreign body without damage to nail, initial encounter  Laceration of left middle finger without foreign body without damage to nail, initial encounter     Discharge Instructions     We updated tetanus today Keep clean and dry Follow  up if developing signs of infection-increased redness, swelling pain, or drainage If beginning to bleed again hold 15-20 min of consistent firm pressure, follow up if bleeding persisting     ED Prescriptions    None     Controlled Substance Prescriptions [place] Controlled Substance Registry consulted? Not Applicable   Lew Dawes, [place] 02/23/19 1744

## 2019-02-23 NOTE — Discharge Instructions (Signed)
We updated tetanus today Keep clean and dry Follow up if developing signs of infection-increased redness, swelling pain, or drainage If beginning to bleed again hold 15-20 min of consistent firm pressure, follow up if bleeding persisting

## 2019-02-25 ENCOUNTER — Ambulatory Visit (INDEPENDENT_AMBULATORY_CARE_PROVIDER_SITE_OTHER): Payer: Managed Care, Other (non HMO) | Admitting: Family Medicine

## 2019-02-25 ENCOUNTER — Encounter: Payer: Self-pay | Admitting: Family Medicine

## 2019-02-25 ENCOUNTER — Other Ambulatory Visit: Payer: Self-pay

## 2019-02-25 DIAGNOSIS — E669 Obesity, unspecified: Secondary | ICD-10-CM | POA: Diagnosis not present

## 2019-02-25 DIAGNOSIS — Z6833 Body mass index (BMI) 33.0-33.9, adult: Secondary | ICD-10-CM

## 2019-02-25 DIAGNOSIS — E785 Hyperlipidemia, unspecified: Secondary | ICD-10-CM | POA: Diagnosis not present

## 2019-02-25 DIAGNOSIS — I10 Essential (primary) hypertension: Secondary | ICD-10-CM | POA: Diagnosis not present

## 2019-02-25 NOTE — Progress Notes (Signed)
Virtual Visit via Video Note  I connected with Luke Hunt  on 02/25/19 at  4:00 PM EDT by a video enabled telemedicine application and verified that I am speaking with the correct person using two identifiers.  Location patient: work, [employer] research [employer] or home office Persons participating in the virtual visit: patient, provider    HPI:   Luke Hunt is a pleasant 48 y.o. here for follow up. Chronic medical problems summarized below were reviewed for changes and stability and were updated as needed below. These issues and their treatment remain stable for the most part.  Denies CP, SOB, DOE, treatment intolerance or new symptoms.  Thumb laceration: -cut on a bush a few days ago -was seen in ER and wound was cleaned and close with glue -reports seems to be doing well with decreased pain and no purulent discharge or sig swelling or redness  OSA: - on CPAP  HTN/Obesity/HLD: -meds: losartan -trying to eat healthy - diet is good - egg whites, lean meats, fish, veggies -walking and running most days -no CP, SOB, swelling -wt 249 10/18 -->253 (2/19) -->  251 (6/19) --> wt is 252 (4/20)  Alcohol abuse/elevated liver enzymes/constipation/GERD: -seeing GI, Dr. Kinnie Scales -used to drink 42 drinks per week -drinking 1-3 drinks every other day -he does not feel he needs help or is addicted and plans to continue to cut back -he is reading book on alcoholism to help him with a plan to cut back -meds: omeprazole -denies: abd pain, swelling, difficulty cutting back, vomiting  ROS: See pertinent positives and negatives per HPI.  Past Medical History:  Diagnosis Date  . Chronic constipation    intermittent  . Environmental allergies   . GERD   . Gout   . Hyperlipemia   . Hypertension   . Obesity   . OSA (obstructive sleep apnea) 05/23/2012   on CPAP  . Syncope    related to blood draws, stitches    Past Surgical History:  Procedure Laterality Date  . APPENDECTOMY    .  Rigth knee arthroscopy     @ [facility] [facility]  . VASECTOMY     10/2017-[facility]-per patient    Family History  Problem Relation Age of Onset  . Stroke Maternal Grandmother   . Diabetes Mother   . Breast cancer Mother   . Heart disease Maternal Grandfather   . Diabetes Maternal Grandfather   . Cystic fibrosis Maternal Aunt   . Diabetes Brother     SOCIAL HX: see hpi   Current Outpatient Medications:  .  losartan (COZAAR) 50 MG tablet, TAKE 1 TABLET BY MOUTH TWICE A DAY, Disp: 180 tablet, Rfl: 0 .  Omega-3 Fatty Acids (FISH OIL PO), Take by mouth., Disp: , Rfl:  .  omeprazole (PRILOSEC OTC) 20 MG tablet, Take 20 mg by mouth as needed. , Disp: , Rfl:   EXAM:  VITALS per patient if applicable:wt 252  GENERAL: alert, oriented, appears well and in no acute distress  HEENT: atraumatic, conjunttiva clear, no obvious abnormalities on inspection of external nose and ears  NECK: normal movements of the head and neck  LUNGS: on inspection no signs of respiratory distress, breathing rate appears normal, no obvious gross SOB, gasping or wheezing  CV: no obvious cyanosis  MS: moves all visible extremities without noticeable abnormality  PSYCH/NEURO: pleasant and cooperative, no obvious depression or anxiety, speech and thought processing grossly intact  ASSESSMENT AND PLAN:  Discussed the following assessment and plan:  Class  1 obesity with serious comorbidity and body mass index (BMI) of 33.0 to 33.9 in adult, unspecified obesity type  Hyperlipidemia, unspecified hyperlipidemia type  Essential hypertension  Congratulated on healthy lifestyle and improvement in drinking habits. Encouraged to continue to cut down on alcohol. Offered CBT - declined for now. Continue losartan. Has TOC with Dr. Salomon FickBanks in the summer. Postponed labs for now in light of covid 19 pandemic.   I discussed the assessment and treatment plan with the patient. The patient was provided an opportunity to  ask questions and all were answered. The patient agreed with the plan and demonstrated an understanding of the instructions.   The patient was advised to call back or seek an in-person evaluation if needed sooner if any concerns.    Terressa KoyanagiHannah R Rheanne Cortopassi, DO

## 2019-05-26 ENCOUNTER — Other Ambulatory Visit: Payer: Self-pay | Admitting: Family Medicine

## 2019-06-25 ENCOUNTER — Encounter: Payer: Managed Care, Other (non HMO) | Admitting: Family Medicine

## 2019-07-09 ENCOUNTER — Other Ambulatory Visit: Payer: Self-pay

## 2019-07-09 ENCOUNTER — Encounter: Payer: Self-pay | Admitting: Family Medicine

## 2019-07-09 ENCOUNTER — Ambulatory Visit (INDEPENDENT_AMBULATORY_CARE_PROVIDER_SITE_OTHER): Payer: Managed Care, Other (non HMO) | Admitting: Family Medicine

## 2019-07-09 VITALS — Wt 261.0 lb

## 2019-07-09 DIAGNOSIS — R011 Cardiac murmur, unspecified: Secondary | ICD-10-CM

## 2019-07-09 DIAGNOSIS — F1010 Alcohol abuse, uncomplicated: Secondary | ICD-10-CM | POA: Diagnosis not present

## 2019-07-09 DIAGNOSIS — G4733 Obstructive sleep apnea (adult) (pediatric): Secondary | ICD-10-CM

## 2019-07-09 DIAGNOSIS — I10 Essential (primary) hypertension: Secondary | ICD-10-CM

## 2019-07-09 NOTE — Progress Notes (Signed)
Subjective:    Patient ID: Luke Hunt, male    DOB: 1970-12-08, 48 y.o.   MRN: [id number]  No chief complaint on file.   HPI Patient was seen today for f/u on chronic conditions and TOC, previously seen by Dr. Maudie Mercury.  HTN: -taking losartan 50 mg daily -exercising and trying to eat better -not checking bp at home.  May check at the store  OSA: -on CPAP -denies feeling tired during the day  Back pain: -seen by Antionette Char -states has compressed disc  EtOH abuse: -drinking 24 beer per wk -pt has no desire to quit. -states has a stressful job and alcohol will probably kill him. -denies eye opener -no longer seeing GI  Past Medical History:  Diagnosis Date  . Chronic constipation    intermittent  . Environmental allergies   . GERD   . Gout   . Hyperlipemia   . Hypertension   . Obesity   . OSA (obstructive sleep apnea) 05/23/2012   on CPAP  . Syncope    related to blood draws, stitches    No Known Allergies  ROS General: Denies fever, chills, night sweats, changes in weight, changes in appetite HEENT: Denies headaches, ear pain, changes in vision, rhinorrhea, sore throat CV: Denies CP, palpitations, SOB, orthopnea Pulm: Denies SOB, cough, wheezing GI: Denies abdominal pain, nausea, vomiting, diarrhea, constipation GU: Denies dysuria, hematuria, frequency, vaginal discharge Msk: Denies muscle cramps, joint pains Neuro: Denies weakness, numbness, tingling Skin: Denies rashes, bruising Psych: Denies depression, anxiety, hallucinations     Objective:    Weight 261 lb (118.4 kg).   Gen. Pleasant, well-nourished, in no distress, normal affect   HEENT: [place]/AT, face symmetric, conjunctiva clear, no scleral icterus, PERRLA, EOMI, nares patent without drainage Lungs: no accessory muscle use, CTAB, no wheezes or rales Cardiovascular: RRR, 2/6 murmur best heard at the LUSB, no peripheral edema Abdomen: BS present, soft, NT/ND Musculoskeletal: No deformities, no  cyanosis or clubbing, normal tone Neuro:  A&Ox3, CN II-XII intact, normal gait Skin:  Warm, no lesions/ rash. No spider telangiectasias   Wt Readings from Last 3 Encounters:  07/09/19 261 lb (118.4 kg)  04/23/18 251 lb 8 oz (114.1 kg)  12/13/17 253 lb 3.2 oz (114.9 kg)    Lab Results  Component Value Date   WBC 5.1 04/23/2018   HGB 12.9 (L) 04/23/2018   HCT 38.1 (L) 04/23/2018   PLT 193.0 04/23/2018   GLUCOSE 112 (H) 04/23/2018   CHOL 203 (H) 04/23/2018   TRIG 109.0 04/23/2018   HDL 50.00 04/23/2018   LDLDIRECT 141.9 06/30/2013   LDLCALC 131 (H) 04/23/2018   ALT 19 04/23/2018   AST 18 04/23/2018   NA 140 04/23/2018   K 4.2 04/23/2018   CL 104 04/23/2018   CREATININE 1.09 04/23/2018   BUN 17 04/23/2018   CO2 30 04/23/2018   TSH 2.50 03/03/2016   PSA 0.41 06/30/2013   INR 1.1 (H) 04/23/2018   HGBA1C 5.9 04/23/2018    Assessment/Plan:  Heart murmur  -given handou - Plan: ECHOCARDIOGRAM COMPLETE  Alcohol abuse -encouraged to consider cutting down.  Pt declines. -discussed CMP.  Pt wishes to wait to have labs done at [facility]. -given handout  OSA (obstructive sleep apnea) -continue CPAP use  Essential hypertension -controlled -continue losartan 50 mg daily -continue lifestyle modifications  Offered influenza vaccine, pt declines.  F/u prn.    Grier Mitts, MD

## 2019-07-09 NOTE — Patient Instructions (Signed)
Managing Your Hypertension Hypertension is commonly called high blood pressure. This is when the force of your blood pressing against the walls of your arteries is too strong. Arteries are blood vessels that carry blood from your heart throughout your body. Hypertension forces the heart to work harder to pump blood, and may cause the arteries to become narrow or stiff. Having untreated or uncontrolled hypertension can cause heart attack, stroke, kidney disease, and other problems. What are blood pressure readings? A blood pressure reading consists of a higher number over a lower number. Ideally, your blood pressure should be below 120/80. The first ("top") number is called the systolic pressure. It is a measure of the pressure in your arteries as your heart beats. The second ("bottom") number is called the diastolic pressure. It is a measure of the pressure in your arteries as the heart relaxes. What does my blood pressure reading mean? Blood pressure is classified into four stages. Based on your blood pressure reading, your health care provider may use the following stages to determine what type of treatment you need, if any. Systolic pressure and diastolic pressure are measured in a unit called mm Hg. Normal  Systolic pressure: below 784.  Diastolic pressure: below 80. Elevated  Systolic pressure: 696-295.  Diastolic pressure: below 80. Hypertension stage 1  Systolic pressure: 284-132.  Diastolic pressure: 44-01. Hypertension stage 2  Systolic pressure: 027 or above.  Diastolic pressure: 90 or above. What health risks are associated with hypertension? Managing your hypertension is an important responsibility. Uncontrolled hypertension can lead to:  A heart attack.  A stroke.  A weakened blood vessel (aneurysm).  Heart failure.  Kidney damage.  Eye damage.  Metabolic syndrome.  Memory and concentration problems. What changes can I make to manage my hypertension?  Hypertension can be managed by making lifestyle changes and possibly by taking medicines. Your health care provider will help you make a plan to bring your blood pressure within a normal range. Eating and drinking   Eat a diet that is high in fiber and potassium, and low in salt (sodium), added sugar, and fat. An example eating plan is called the DASH (Dietary Approaches to Stop Hypertension) diet. To eat this way: ? Eat plenty of fresh fruits and vegetables. Try to fill half of your plate at each meal with fruits and vegetables. ? Eat whole grains, such as whole wheat pasta, brown rice, or whole grain bread. Fill about one quarter of your plate with whole grains. ? Eat low-fat diary products. ? Avoid fatty cuts of meat, processed or cured meats, and poultry with skin. Fill about one quarter of your plate with lean proteins such as fish, chicken without skin, beans, eggs, and tofu. ? Avoid premade and processed foods. These tend to be higher in sodium, added sugar, and fat.  Reduce your daily sodium intake. Most people with hypertension should eat less than 1,500 mg of sodium a day.  Limit alcohol intake to no more than 1 drink a day for nonpregnant women and 2 drinks a day for men. One drink equals 12 oz of beer, 5 oz of wine, or 1 oz of hard liquor. Lifestyle  Work with your health care provider to maintain a healthy body weight, or to lose weight. Ask what an ideal weight is for you.  Get at least 30 minutes of exercise that causes your heart to beat faster (aerobic exercise) most days of the week. Activities may include walking, swimming, or biking.  Include exercise  to strengthen your muscles (resistance exercise), such as weight lifting, as part of your weekly exercise routine. Try to do these types of exercises for 30 minutes at least 3 days a week.  Do not use any products that contain nicotine or tobacco, such as cigarettes and e-cigarettes. If you need help quitting, ask your health  care provider.  Control any long-term (chronic) conditions you have, such as high cholesterol or diabetes. Monitoring  Monitor your blood pressure at home as told by your health care provider. Your personal target blood pressure may vary depending on your medical conditions, your age, and other factors.  Have your blood pressure checked regularly, as often as told by your health care provider. Working with your health care provider  Review all the medicines you take with your health care provider because there may be side effects or interactions.  Talk with your health care provider about your diet, exercise habits, and other lifestyle factors that may be contributing to hypertension.  Visit your health care provider regularly. Your health care provider can help you create and adjust your plan for managing hypertension. Will I need medicine to control my blood pressure? Your health care provider may prescribe medicine if lifestyle changes are not enough to get your blood pressure under control, and if:  Your systolic blood pressure is 130 or higher.  Your diastolic blood pressure is 80 or higher. Take medicines only as told by your health care provider. Follow the directions carefully. Blood pressure medicines must be taken as prescribed. The medicine does not work as well when you skip doses. Skipping doses also puts you at risk for problems. Contact a health care provider if:  You think you are having a reaction to medicines you have taken.  You have repeated (recurrent) headaches.  You feel dizzy.  You have swelling in your ankles.  You have trouble with your vision. Get help right away if:  You develop a severe headache or confusion.  You have unusual weakness or numbness, or you feel faint.  You have severe pain in your chest or abdomen.  You vomit repeatedly.  You have trouble breathing. Summary  Hypertension is when the force of blood pumping through your arteries  is too strong. If this condition is not controlled, it may put you at risk for serious complications.  Your personal target blood pressure may vary depending on your medical conditions, your age, and other factors. For most people, a normal blood pressure is less than 120/80.  Hypertension is managed by lifestyle changes, medicines, or both. Lifestyle changes include weight loss, eating a healthy, low-sodium diet, exercising more, and limiting alcohol. This information is not intended to replace advice given to you by your health care provider. Make sure you discuss any questions you have with your health care provider. Document Released: 07/17/2012 Document Revised: 02/14/2019 Document Reviewed: 09/20/2016 Elsevier Patient Education  2020 [employer]  Heart Murmur A heart murmur is an extra sound that is caused by chaotic blood flow through the valves of the heart. The murmur can be heard as a "hum" or "whoosh" sound when blood flows through the heart. There are two types of heart murmurs:  Innocent (benign) murmurs. Most people with this type of heart murmur do not have a heart problem. Many children have innocent heart murmurs. Your health care provider may suggest some basic tests to find out whether your murmur is an innocent murmur. If an innocent heart murmur is found, there is no  need for further tests or treatment and no need to restrict activities or stop playing sports.  Abnormal murmurs. These types of murmurs can occur in children and adults. Abnormal murmurs may be a sign of a more serious heart condition, such as a heart defect present at birth (congenital defect) or heart valve disease. What are the causes?  The heart has four areas called chambers. Valves separate the upper and lower chambers from each other (tricuspid valve and mitral valve) and separate the lower chambers of the heart from pathways that lead away from the heart (aortic valve and pulmonary valve). Normally,  the valves open to let blood flow through or out of your heart, and then they shut to keep the blood from flowing backward. This condition is caused by heart valves that are not working properly.  In children, abnormal heart murmurs are typically caused by congenital defects.  In adults, abnormal murmurs are usually caused by heart valve problems from disease, infection, or aging. This condition may also be caused by:  Pregnancy.  Fever.  Overactive thyroid gland.  Anemia.  Exercise.  Rapid growth spurts (in children). What are the signs or symptoms? Innocent murmurs do not cause symptoms, and many people with abnormal murmurs may not have symptoms. If symptoms do develop, they may include:  Shortness of breath.  Blue coloring of the skin, especially on the fingertips.  Chest pain.  Palpitations, or feeling a fluttering or skipped heartbeat.  Fainting.  Persistent cough.  Getting tired much faster than expected.  Swelling in the abdomen, feet, or ankles. How is this diagnosed? This condition may be diagnosed during a routine physical or other exam. If your health care provider hears a murmur with a stethoscope, he or she will listen for:  Where the murmur is located in your heart.  How long the murmur lasts (duration).  When the murmur is heard during the heartbeat.  How loud the murmur is. This may help the health care provider figure out what is causing the murmur. You may be referred to a heart specialist (cardiologist). You may also have other tests, including:  Electrocardiogram (ECG or EKG). This test measures the electrical activity of your heart.  Echocardiogram. This test uses high frequency sound waves to make pictures of your heart.  MRI or chest X-ray.  Cardiac catheterization. This test looks at blood flow through the arteries around the heart. For children and adults who have an abnormal heart murmur and want to stay active, it is important to:   Complete testing.  Review test results.  Receive recommendations from your health care provider. If heart disease is present, it may not be safe to play or be active. How is this treated? Heart murmurs themselves do not need treatment. In some cases, a heart murmur may go away on its own. If an underlying problem or disease is causing the murmur, you may need treatment. If treatment is needed, it will depend on the type and severity of the disease or heart problem causing the murmur. Treatment may include:  Medicine.  Surgery.  Dietary and lifestyle changes. Follow these instructions at home:  Talk with your health care provider before participating in sports or other activities that require a lot of effort and energy (are strenuous).  Learn as much as possible about your condition and any related diseases. Ask your health care provider if you may be at risk for any medical emergencies.  Talk with your health care provider about what symptoms  you should look out for.  It is up to you to get your test results. Ask your health care provider, or the department that is doing the test, when your results will be ready.  Keep all follow-up visits as told by your health care provider. This is important. Contact a health care provider if:  You are frequently short of breath.  You feel more tired than usual.  You are having a hard time keeping up with normal activities or fitness routines.  You have swelling in your ankles or feet.  You notice that your heart often beats irregularly.  You develop any new symptoms. Get help right away if:  You have chest pain.  You are having trouble breathing.  You feel light-headed or you pass out.  Your symptoms suddenly get worse. These symptoms may represent a serious problem that is an emergency. Do not wait to see if the symptoms will go away. Get medical help right away. Call your local emergency services (911 in the [place] Do not drive  yourself to the hospital. Summary  Normally, the heart valves open to let blood flow through or out of your heart, and then they shut to keep the blood from flowing backward.  A heart murmur is caused by heart valves that are not working properly.  You may need treatment if an underlying problem or disease is causing the heart murmur. Treatment may include medicine, surgery, or dietary and lifestyle changes.  Talk with your health care provider before participating in sports or other activities that require a lot of effort and energy (are strenuous).  Talk with your health care provider about what symptoms you should watch out for. This information is not intended to replace advice given to you by your health care provider. Make sure you discuss any questions you have with your health care provider. Document Released: 11/30/2004 Document Revised: 04/16/2018 Document Reviewed: 04/16/2018 Elsevier Patient Education  2020 [employer].  Alcohol Abuse and Nutrition Alcohol abuse is any pattern of alcohol consumption that harms your health, relationships, or work. Alcohol abuse can cause poor nutrition (malnutrition or malnourishment) and a lack of nutrients (nutrient deficiencies), which can lead to more complications. Alcohol abuse brings malnutrition and nutrient deficiencies in two ways:  It causes your liver to work abnormally. This affects how your body divides (breaks down) and absorbs nutrients from food.  It causes you to eat poorly. Many people who abuse alcohol do not eat enough carbohydrates, protein, fat, vitamins, and minerals. Nutrients that are commonly lacking (deficient) in people who abuse alcohol include:  Vitamins. ? Vitamin A. This is needed for your vision, metabolism, and ability to fight off infections (immunity). ? B vitamins. These include folate, thiamine, and niacin. These are needed for new cell growth. ? Vitamin C. This plays an important role in wound healing,  immunity, and helping your body to absorb iron. ? Vitamin D. This is necessary for your body to absorb and use calcium. It is produced by your liver, but you can also get it from food and from sun exposure.  Minerals. ? Calcium. This is needed for healthy bones as well as heart and blood vessel (cardiovascular) function. ? Iron. This is important for blood, muscle, and nervous system functioning. ? Magnesium. This plays an important role in muscle and nerve function, and it helps to control blood sugar and blood pressure. ? Zinc. This is important for the normal functioning of your nervous system and digestive system (gastrointestinal tract).  If you think that you have an alcohol dependency problem, or if it is hard to stop drinking because you feel sick or different when you do not use alcohol, talk with your health care provider or another health professional about where to get help. Nutrition is an essential factor in therapy for alcohol abuse. Your health care provider or diet and nutrition specialist (dietitian) will work with you to design a plan that can help to restore nutrients to your body and prevent the risk of complications. What is my plan? Your dietitian may develop a specific eating plan that is based on your condition and any other problems that you have. An eating plan will commonly include:  A balanced diet. ? Grains: 6-8 oz (170-227 g) a day. Examples of 1 oz of whole grains include 1 cup of whole-wheat cereal,  cup of brown rice, or 1 slice of whole-wheat bread. ? Vegetables: 2-3 cups a day. Examples of 1 cup of vegetables include 2 medium carrots, 1 large tomato, or 2 stalks of celery. ? Fruits: 1-2 cups a day. Examples of 1 cup of fruit include 1 large banana, 1 small apple, 8 large strawberries, or 1 large orange. ? Meat and other protein: 5-6 oz (142-170 g) a day.  A cut of meat or fish that is the size of a deck of cards is about 3-4 oz.  Foods that provide 1 oz of  protein include 1 egg,  cup of nuts or seeds, or 1 tablespoon (16 g) of peanut butter. ? Dairy: 2-3 cups a day. Examples of 1 cup of dairy include 8 oz (230 mL) of milk, 8 oz (230 g) of yogurt, or 1 oz (44 g) of natural cheese.  Vitamin and mineral supplements. What are tips for following this plan?  Eat frequent meals and snacks. Try to eat 5-6 small meals each day.  Take vitamin or mineral supplements as recommended by your dietitian.  If you are malnourished or if your dietitian recommends it: ? You may follow a high-protein, high-calorie diet. This may include:  2,000-3,000 calories (kilocalories) a day.  70-100 g (grams) of protein a day. ? You may be directed to follow a diet that includes a complete nutritional supplement beverage. This can help to restore calories, protein, and vitamins to your body. Depending on your condition, you may be advised to consume this beverage instead of your meals or in addition to them.  Certain medicines may cause changes in your appetite, taste, and weight. Work with your health care provider and dietitian to make any changes to your medicines and eating plan.  If you are unable to take in enough food and calories by mouth, your health care provider may recommend a feeding tube. This tube delivers nutritional supplements directly to your stomach. Recommended foods  Eat foods that are high in molecules that prevent oxygen from reacting with your food (antioxidants). These foods include grapes, berries, nuts, green tea, and dark green or orange vegetables. Eating these can help to prevent some of the stress that is placed on your liver by consuming alcohol.  Eat a variety of fresh fruits and vegetables each day. This will help you to get fiber and vitamins in your diet.  Drink plenty of water and other clear fluids, such as apple juice and broth. Try to drink at least 48-64 oz (1.5-2 L) of water a day.  Include foods fortified with vitamins and  minerals in your diet. Commonly fortified foods include milk, orange  juice, cereal, and bread.  Eat a variety of foods that are high in omega-3 and omega-6 fatty acids. These include fish, nuts and seeds, and soybeans. These foods may help your liver to recover and may also stabilize your mood.  If you are a vegetarian: ? Eat a variety of protein-rich foods. ? Pair whole grains with plant-based proteins at meals and snack time. For example, eat rice with beans, put peanut butter on whole-grain toast, or eat oatmeal with sunflower seeds. The items listed above may not be a complete list of foods and beverages you can eat. Contact a dietitian for more information. Foods to avoid  Avoid foods and drinks that are high in fat and sugar. Sugary drinks, salty snacks, and candy contain empty calories. This means that they lack important nutrients such as protein, fiber, and vitamins.  Avoid alcohol. This is the best way to avoid malnutrition due to alcohol abuse. If you must drink, drink measured amounts. Measured drinking means limiting your intake to no more than 1 drink a day for nonpregnant women and 2 drinks a day for men. One drink equals 12 oz (355 mL) of beer, 5 oz (148 mL) of wine, or 1 oz (44 mL) of hard liquor.  Limit your intake of caffeine. Replace drinks like coffee and Kincaid tea with decaffeinated coffee and decaffeinated herbal tea. The items listed above may not be a complete list of foods and beverages you should avoid. Contact a dietitian for more information. Summary  Alcohol abuse can cause poor nutrition (malnutrition or malnourishment) and a lack of nutrients (nutrient deficiencies), which can lead to more health problems.  Common nutrient deficiencies include vitamin deficiencies (A, B, C, and D) and mineral deficiencies (calcium, iron, magnesium, and zinc).  Nutrition is an essential factor in therapy for alcohol abuse.  Your health care provider and dietitian can help you  to develop a specific eating plan that includes a balanced diet plus vitamin and mineral supplements. This information is not intended to replace advice given to you by your health care provider. Make sure you discuss any questions you have with your health care provider. Document Released: 08/17/2005 Document Revised: 02/11/2019 Document Reviewed: 07/10/2017 Elsevier Patient Education  2020 [employer].

## 2019-07-10 ENCOUNTER — Encounter (HOSPITAL_COMMUNITY): Payer: Self-pay | Admitting: Family Medicine

## 2019-08-07 ENCOUNTER — Telehealth (HOSPITAL_COMMUNITY): Payer: Self-pay

## 2019-08-07 NOTE — Telephone Encounter (Signed)
New message    Just an FYI. We have made several attempts to contact this patient including sending a letter to schedule or reschedule their echocardiogram. We will be removing the patient from the echo [place].   10.1.20 @ 10:59am both # are the same Oral Hallgren  9.23.20 @ 3:15pm lm on home vm Xane Amsden  9.3.20 @ 9:44am lm on home vm - mail reminder letter Jari Sportsman

## 2019-08-21 ENCOUNTER — Other Ambulatory Visit: Payer: Self-pay

## 2019-08-21 ENCOUNTER — Ambulatory Visit (INDEPENDENT_AMBULATORY_CARE_PROVIDER_SITE_OTHER): Payer: Managed Care, Other (non HMO) | Admitting: Family Medicine

## 2019-08-21 ENCOUNTER — Encounter: Payer: Self-pay | Admitting: Family Medicine

## 2019-08-21 VITALS — BP 110/80 | HR 57 | Temp 98.0°F | Wt 264.0 lb

## 2019-08-21 DIAGNOSIS — Z87828 Personal history of other (healed) physical injury and trauma: Secondary | ICD-10-CM

## 2019-08-21 DIAGNOSIS — E782 Mixed hyperlipidemia: Secondary | ICD-10-CM | POA: Diagnosis not present

## 2019-08-21 DIAGNOSIS — Z0000 Encounter for general adult medical examination without abnormal findings: Secondary | ICD-10-CM | POA: Diagnosis not present

## 2019-08-21 DIAGNOSIS — I10 Essential (primary) hypertension: Secondary | ICD-10-CM

## 2019-08-21 DIAGNOSIS — Z113 Encounter for screening for infections with a predominantly sexual mode of transmission: Secondary | ICD-10-CM | POA: Diagnosis not present

## 2019-08-21 DIAGNOSIS — R7303 Prediabetes: Secondary | ICD-10-CM | POA: Diagnosis not present

## 2019-08-21 LAB — COMPREHENSIVE METABOLIC PANEL
ALT: 21 U/L (ref 0–53)
AST: 20 U/L (ref 0–37)
Albumin: 4.3 g/dL (ref 3.5–5.2)
Alkaline Phosphatase: 44 U/L (ref 39–117)
BUN: 18 mg/dL (ref 6–23)
CO2: 29 mEq/L (ref 19–32)
Calcium: 9.5 mg/dL (ref 8.4–10.5)
Chloride: 102 mEq/L (ref 96–112)
Creatinine, Ser: 1.13 mg/dL (ref 0.40–1.50)
GFR: 69.37 mL/min (ref >60.00)
Glucose, Bld: 115 mg/dL — ABNORMAL HIGH (ref 70–99)
Potassium: 4.5 mEq/L (ref 3.5–5.1)
Sodium: 137 mEq/L (ref 135–145)
Total Bilirubin: 0.7 mg/dL (ref 0.2–1.2)
Total Protein: 6.9 g/dL (ref 6.0–8.3)

## 2019-08-21 LAB — CBC WITH DIFFERENTIAL/PLATELET
Basophils Absolute: 0 10*3/uL (ref 0.0–0.1)
Basophils Relative: 0.5 % (ref 0.0–3.0)
Eosinophils Absolute: 0.1 10*3/uL (ref 0.0–0.7)
Eosinophils Relative: 1.3 % (ref 0.0–5.0)
HCT: 40.1 % (ref 39.0–52.0)
Hemoglobin: 13.5 g/dL (ref 13.0–17.0)
Lymphocytes Relative: 33 % (ref 12.0–46.0)
Lymphs Abs: 1.9 10*3/uL (ref 0.7–4.0)
MCHC: 33.7 g/dL (ref 30.0–36.0)
MCV: 89.8 fl (ref 78.0–100.0)
Monocytes Absolute: 0.4 10*3/uL (ref 0.1–1.0)
Monocytes Relative: 6.9 % (ref 3.0–12.0)
Neutro Abs: 3.4 10*3/uL (ref 1.4–7.7)
Neutrophils Relative %: 58.3 % (ref 43.0–77.0)
Platelets: 213 10*3/uL (ref 150.0–400.0)
RBC: 4.46 Mil/uL (ref 4.22–5.81)
RDW: 13.8 % (ref 11.5–15.5)
WBC: 5.9 10*3/uL (ref 4.0–10.5)

## 2019-08-21 LAB — LIPID PANEL
Cholesterol: 213 mg/dL — ABNORMAL HIGH (ref 0–200)
HDL: 51.6 mg/dL (ref >39.00)
LDL Cholesterol: 140 mg/dL — ABNORMAL HIGH (ref 0–99)
NonHDL: 161.05
Total CHOL/HDL Ratio: 4
Triglycerides: 107 mg/dL (ref 0.0–149.0)
VLDL: 21.4 mg/dL (ref 0.0–40.0)

## 2019-08-21 LAB — HEMOGLOBIN A1C: Hgb A1c MFr Bld: 6 % (ref 4.6–6.5)

## 2019-08-21 NOTE — Patient Instructions (Signed)
Preventive Care 40-47 Years Old, Male Preventive care refers to lifestyle choices and visits with your health care provider that can promote health and wellness. This includes:  A yearly physical exam. This is also called an annual well check.  Regular dental and eye exams.  Immunizations.  Screening for certain conditions.  Healthy lifestyle choices, such as eating a healthy diet, getting regular exercise, not using drugs or products that contain nicotine and tobacco, and limiting alcohol use. What can I expect for my preventive care visit? Physical exam Your health care provider will check:  Height and weight. These may be used to calculate body mass index (BMI), which is a measurement that tells if you are at a healthy weight.  Heart rate and blood pressure.  Your skin for abnormal spots. Counseling Your health care provider may ask you questions about:  Alcohol, tobacco, and drug use.  Emotional well-being.  Home and relationship well-being.  Sexual activity.  Eating habits.  Work and work environment. What immunizations do I need?  Influenza (flu) vaccine  This is recommended every year. Tetanus, diphtheria, and pertussis (Tdap) vaccine  You may need a Td booster every 10 years. Varicella (chickenpox) vaccine  You may need this vaccine if you have not already been vaccinated. Zoster (shingles) vaccine  You may need this after age 60. Measles, mumps, and rubella (MMR) vaccine  You may need at least one dose of MMR if you were born in 1957 or later. You may also need a second dose. Pneumococcal conjugate (PCV13) vaccine  You may need this if you have certain conditions and were not previously vaccinated. Pneumococcal polysaccharide (PPSV23) vaccine  You may need one or two doses if you smoke cigarettes or if you have certain conditions. Meningococcal conjugate (MenACWY) vaccine  You may need this if you have certain conditions. Hepatitis A vaccine   You may need this if you have certain conditions or if you travel or work in places where you may be exposed to hepatitis A. Hepatitis B vaccine  You may need this if you have certain conditions or if you travel or work in places where you may be exposed to hepatitis B. Haemophilus influenzae type b (Hib) vaccine  You may need this if you have certain risk factors. Human papillomavirus (HPV) vaccine  If recommended by your health care provider, you may need three doses over 6 months. You may receive vaccines as individual doses or as more than one vaccine together in one shot (combination vaccines). Talk with your health care provider about the risks and benefits of combination vaccines. What tests do I need? Blood tests  Lipid and cholesterol levels. These may be checked every 5 years, or more frequently if you are over 50 years old.  Hepatitis C test.  Hepatitis B test. Screening  Lung cancer screening. You may have this screening every year starting at age 55 if you have a 30-pack-year history of smoking and currently smoke or have quit within the past 15 years.  Prostate cancer screening. Recommendations will vary depending on your family history and other risks.  Colorectal cancer screening. All adults should have this screening starting at age 50 and continuing until age 75. Your health care provider may recommend screening at age 45 if you are at increased risk. You will have tests every 1-10 years, depending on your results and the type of screening test.  Diabetes screening. This is done by checking your blood sugar (glucose) after you have not eaten   for a while (fasting). You may have this done every 1-3 years.  Sexually transmitted disease (STD) testing. Follow these instructions at home: Eating and drinking  Eat a diet that includes fresh fruits and vegetables, whole grains, lean protein, and low-fat dairy products.  Take vitamin and mineral supplements as recommended  by your health care provider.  Do not drink alcohol if your health care provider tells you not to drink.  If you drink alcohol: ? Limit how much you have to 0-2 drinks a day. ? Be aware of how much alcohol is in your drink. In the [place] one drink equals one 12 oz bottle of beer (355 mL), one 5 oz glass of wine (148 mL), or one 1 oz glass of hard liquor (44 mL). Lifestyle  Take daily care of your teeth and gums.  Stay active. Exercise for at least 30 minutes on 5 or more days each week.  Do not use any products that contain nicotine or tobacco, such as cigarettes, e-cigarettes, and chewing tobacco. If you need help quitting, ask your health care provider.  If you are sexually active, practice safe sex. Use a condom or other form of protection to prevent STIs (sexually transmitted infections).  Talk with your health care provider about taking a low-dose aspirin every day starting at age 26. What's next?  Go to your health care provider once a year for a well check visit.  Ask your health care provider how often you should have your eyes and teeth checked.  Stay up to date on all vaccines. This information is not intended to replace advice given to you by your health care provider. Make sure you discuss any questions you have with your health care provider. Document Released: 11/19/2015 Document Revised: 10/17/2018 Document Reviewed: 10/17/2018 Elsevier Patient Education  2020 [employer].

## 2019-08-21 NOTE — Progress Notes (Signed)
Subjective:     Luke Hunt is a 48 y.o. male and is here for a comprehensive physical exam. The patient reports no problems  Pt states he has been doing well.  Pt requesting STI testing and labs to check his liver.  Pt followed by Dr. Earlean Shawl, GI for h/o liver damage 2/2 EtOH use.  Denies current GI issues.  States bp has been well controlled on current med.  Pt notes h/o abdominal hernia that is stable.  Pt notes he has not been able to have the ECHO done yet for his murmur 2/2 having to pay for his daughter's wisdom teeth removal.  Pt endorses doing cardio daily.  Social History   Socioeconomic History  . Marital status: Married    Spouse name: Not on file  . Number of children: 2  . Years of education: Not on file  . Highest education level: Not on file  Occupational History  . Occupation: Nature conservation officer  Social Needs  . Financial resource strain: Not on file  . Food insecurity    Worry: Not on file    Inability: Not on file  . Transportation needs    Medical: Not on file    Non-medical: Not on file  Tobacco Use  . Smoking status: Former Smoker    Years: 6.00    Types: Cigarettes    Quit date: 02/15/2006    Years since quitting: 13.5  . Smokeless tobacco: Never Used  Substance and Sexual Activity  . Alcohol use: Yes    Alcohol/week: 14.0 standard drinks    Types: 14 Standard drinks or equivalent per week    Comment: 2 per day  . Drug use: No  . Sexual activity: Not on file  Lifestyle  . Physical activity    Days per week: Not on file    Minutes per session: Not on file  . Stress: Not on file  Relationships  . Social Herbalist on phone: Not on file    Gets together: Not on file    Attends religious service: Not on file    Active member of club or organization: Not on file    Attends meetings of clubs or organizations: Not on file    Relationship status: Not on file  . Intimate partner violence    Fear of current or ex partner: Not on file   Emotionally abused: Not on file    Physically abused: Not on file    Forced sexual activity: Not on file  Other Topics Concern  . Not on file  Social History Narrative   Work or School: [employer] Situation: separated from wife in 2019; has son and daughter      Spiritual Beliefs: Christian      Lifestyle: regular exercise - heavy lifter; diet poor      Health Maintenance  Topic Date Due  . INFLUENZA VACCINE  06/07/2019  . COLONOSCOPY  04/09/2023  . TETANUS/TDAP  02/22/2029  . HIV Screening  Completed    The following portions of the patient's history were reviewed and updated as appropriate: allergies, current medications, past family history, past medical history, past social history, past surgical history and problem list.  Review of Systems Pertinent items noted in HPI and remainder of comprehensive ROS otherwise negative.   Objective:    BP 110/80 (BP Location: Left Arm, Patient Position: Sitting, Cuff Size: Large)   Pulse (!) 57   Temp  98 F (36.7 C) (Temporal)   Wt 264 lb (119.7 kg)   SpO2 97%   BMI 35.31 kg/m  General appearance: alert, cooperative and no distress Head: Normocephalic, without obvious abnormality, atraumatic Eyes: conjunctivae/corneas clear. PERRL, EOM's intact. Fundi benign. Ears: normal TM's and external ear canals both ears Nose: Nares normal. Septum midline. Mucosa normal. No drainage or sinus tenderness. Throat: lips, mucosa, and tongue normal; teeth and gums normal Neck: no adenopathy, no carotid bruit, no JVD, supple, symmetrical, trachea midline and thyroid not enlarged, symmetric, no tenderness/mass/nodules Lungs: clear to auscultation bilaterally Heart: RRR, 1/6 murmur.  no r/g.  no peripheral edema Abdomen: soft, non-tender; bowel sounds normal; no masses,  no organomegaly Extremities: extremities normal, atraumatic, no cyanosis or edema Pulses: 2+ and symmetric Skin: Skin color, texture, turgor normal. No  rashes or lesions Lymph nodes: Cervical, supraclavicular, and axillary nodes normal. Neurologic: Alert and oriented X 3, normal strength and tone. Normal symmetric reflexes. Normal coordination and gait    Assessment:    Healthy male exam.      Plan:     Anticipatory guidance given including wearing seatbelts, smoke detectors in the home, increasing physical activity, increasing p.o. intake of water and vegetables. -will obtain labs and STI testing -offered influenza vaccine.  Pt declines. -given handout -next CPE in 1 yr See After Visit Summary for Counseling Recommendations    HTN -controlled -continue losartan 50 mg daily -will obtain CMP  HLD -continue omega 3 fatty acids -Discussed lifestyle modifications -We will obtain lipid panel  PreDM -last hgb A1C was 5.9% on 04/23/18 -will recheck -lifestyle modifications encouraged.  H/o liver injury -2/2 EtOH use.  Advised to cut down/avoid EtOH -continue f/u with Dr. Kinnie Scales, GI -will obtain CMP  F/u prn  Abbe Amsterdam, MD

## 2019-08-23 LAB — C. TRACHOMATIS/N. GONORRHOEAE RNA
C. trachomatis RNA, TMA: NOT DETECTED
N. gonorrhoeae RNA, TMA: NOT DETECTED

## 2019-08-23 LAB — RPR: RPR Ser Ql: NONREACTIVE

## 2019-08-23 LAB — HIV ANTIBODY (ROUTINE TESTING W REFLEX): HIV 1&2 Ab, 4th Generation: NONREACTIVE

## 2019-08-25 ENCOUNTER — Encounter: Payer: Self-pay | Admitting: Family Medicine

## 2019-08-25 DIAGNOSIS — R7303 Prediabetes: Secondary | ICD-10-CM | POA: Insufficient documentation

## 2019-09-07 ENCOUNTER — Other Ambulatory Visit: Payer: Self-pay | Admitting: Family Medicine

## 2019-12-15 ENCOUNTER — Other Ambulatory Visit: Payer: Self-pay | Admitting: Family Medicine

## 2020-02-16 ENCOUNTER — Encounter: Payer: Self-pay | Admitting: Family Medicine

## 2020-02-16 ENCOUNTER — Telehealth (INDEPENDENT_AMBULATORY_CARE_PROVIDER_SITE_OTHER): Payer: Managed Care, Other (non HMO) | Admitting: Family Medicine

## 2020-02-16 VITALS — Temp 98.7°F

## 2020-02-16 DIAGNOSIS — U071 COVID-19: Secondary | ICD-10-CM | POA: Diagnosis not present

## 2020-02-16 DIAGNOSIS — B999 Unspecified infectious disease: Secondary | ICD-10-CM | POA: Diagnosis not present

## 2020-02-16 DIAGNOSIS — B353 Tinea pedis: Secondary | ICD-10-CM

## 2020-02-16 MED ORDER — MICONAZOLE NITRATE 2 % EX CREA: 1.0000 "application " | TOPICAL_CREAM | Freq: Two times a day (BID) | CUTANEOUS | 1 refills | Status: DC

## 2020-02-16 NOTE — Progress Notes (Signed)
Virtual Visit via Video Note  I connected with Luke Hunt on 02/16/20 at  3:30 PM EDT by a video enabled telemedicine application 2/2 COVID-19 pandemic and verified that I am speaking with the correct person using two identifiers.  Location patient: home Location provider:work or home office Persons participating in the virtual visit: patient, provider  I discussed the limitations of evaluation and management by telemedicine and the availability of in person appointments. The patient expressed understanding and agreed to proceed.   HPI: Pt tested positive for COVID on 4/3.  Initially started having symptoms on 3/30.  Pt states he is still having congestion in chest, cough, fever 100.6-102F without meds, aches, fatigue, insomnia, difficulty .  Taking ibuprofen.  Was afraid to take tylenol as heard it could be bad for your liver.  Pt has not had any EtOH since last Sunday.  Pt was drinking a case of beer per wk.  Atheletes foot.  States OTC creams do not seem to help.  Has a patch on L foot.  ROS: See pertinent positives and negatives per HPI.  Past Medical History:  Diagnosis Date  . Chronic constipation    intermittent  . Environmental allergies   . GERD   . Gout   . Hyperlipemia   . Hypertension   . Obesity   . OSA (obstructive sleep apnea) 05/23/2012   on CPAP  . Syncope    related to blood draws, stitches    Past Surgical History:  Procedure Laterality Date  . APPENDECTOMY    . Rigth knee arthroscopy      [facility]  . VASECTOMY     12 /2018-[facility]-per patient    Family History  Problem Relation Age of Onset  . Stroke Maternal Grandmother   . Diabetes Mother   . Breast cancer Mother   . Heart disease Maternal Grandfather   . Diabetes Maternal Grandfather   . Cystic fibrosis Maternal Aunt   . Diabetes Brother      Current Outpatient Medications:  .  losartan (COZAAR) 50 MG tablet, TAKE 1 TABLET BY MOUTH TWICE A DAY, Disp: 180 tablet, Rfl: 0 .  Omega-3  Fatty Acids (FISH OIL PO), Take by mouth., Disp: , Rfl:  .  omeprazole (PRILOSEC OTC) 20 MG tablet, Take 20 mg by mouth as needed. , Disp: , Rfl:   EXAM:  VITALS per patient if applicable:  RR between 12-20 bpm  GENERAL: alert, oriented, appears well and in no acute distress  HEENT: atraumatic, conjunctiva clear, no obvious abnormalities on inspection of external nose and ears  NECK: normal movements of the head and neck  LUNGS: on inspection no signs of respiratory distress, breathing rate appears normal, no obvious gross SOB, gasping or wheezing  CV: no obvious cyanosis  MS: moves all visible extremities without noticeable abnormality  PSYCH/NEURO: pleasant and cooperative, no obvious depression or anxiety, speech and thought processing grossly intact  ASSESSMENT AND PLAN:  Discussed the following assessment and plan:  COVID-19 virus infection -Continue supportive care -Discussed continuing quarantine -Given ongoing fever we will set patient up for appointment with respiratory clinic.  Patient given information to schedule appointment. -Given precautions  Fever due to infection -Likely 2/2 COVID-19 virus infection however cannot rule out pneumonia. -We will set patient up appointment for respiratory clinic for further evaluation of possible pneumonia. -Given precautions -Continue Tylenol as needed  Tinea pedis of left foot  - Plan: miconazole (MICATIN) 2 % cream   I discussed the assessment and  treatment plan with the patient. The patient was provided an opportunity to ask questions and all were answered. The patient agreed with the plan and demonstrated an understanding of the instructions.   The patient was advised to call back or seek an in-person evaluation if the symptoms worsen or if the condition fails to improve as anticipated.  I provided 11 minutes of non-face-to-face time during this encounter.   Billie Ruddy, MD

## 2020-02-18 ENCOUNTER — Other Ambulatory Visit: Payer: Self-pay | Admitting: Nurse Practitioner

## 2020-02-18 ENCOUNTER — Ambulatory Visit (INDEPENDENT_AMBULATORY_CARE_PROVIDER_SITE_OTHER): Payer: Managed Care, Other (non HMO) | Admitting: Nurse Practitioner

## 2020-02-18 ENCOUNTER — Other Ambulatory Visit: Payer: Self-pay

## 2020-02-18 VITALS — BP 122/88 | HR 59 | Temp 96.9°F | Ht 73.0 in | Wt 254.5 lb

## 2020-02-18 DIAGNOSIS — U071 COVID-19: Secondary | ICD-10-CM | POA: Diagnosis not present

## 2020-02-18 DIAGNOSIS — R05 Cough: Secondary | ICD-10-CM | POA: Diagnosis not present

## 2020-02-18 DIAGNOSIS — R0602 Shortness of breath: Secondary | ICD-10-CM

## 2020-02-18 DIAGNOSIS — R059 Cough, unspecified: Secondary | ICD-10-CM

## 2020-02-18 DIAGNOSIS — R062 Wheezing: Secondary | ICD-10-CM

## 2020-02-18 MED ORDER — METHYLPREDNISOLONE ACETATE 80 MG/ML IJ SUSP
80.0000 mg | Freq: Once | INTRAMUSCULAR | Status: AC
  Administered 2020-02-18: 80 mg via INTRAMUSCULAR

## 2020-02-18 MED ORDER — PREDNISONE 20 MG PO TABS: 20.0000 mg | ORAL_TABLET | Freq: Every day | ORAL | 0 refills | Status: AC

## 2020-02-18 MED ORDER — METHYLPREDNISOLONE ACETATE 80 MG/ML IJ SUSP: 80.0000 mg | Freq: Once | INTRAMUSCULAR | Status: DC

## 2020-02-18 MED ORDER — BENZONATATE 100 MG PO CAPS: 100.0000 mg | ORAL_CAPSULE | Freq: Two times a day (BID) | ORAL | 0 refills | Status: DC | PRN

## 2020-02-18 MED ORDER — DOXYCYCLINE HYCLATE 100 MG PO TABS: 100.0000 mg | ORAL_TABLET | Freq: Two times a day (BID) | ORAL | 0 refills | Status: DC

## 2020-02-18 MED ORDER — ALBUTEROL SULFATE HFA 108 (90 BASE) MCG/ACT IN AERS: 2.0000 | INHALATION_SPRAY | Freq: Four times a day (QID) | RESPIRATORY_TRACT | 0 refills | Status: DC | PRN

## 2020-02-18 NOTE — Progress Notes (Signed)
@Patient  ID: Luke Hunt, male    DOB: 01-02-1971, 49 y.o.   MRN: [id number]  Chief Complaint  Patient presents with  . Post COVID    Postive 4/3. Still having fevers, stated he does take motrin but if he misses a dose fevers will run up to 102. Also complains on cough, fatigue and nighsweats.     Referring provider: Billie Ruddy, MD   49 year old male with HTN, allergic rhinitis, OSA, GERD, Obesity, Alcohol abuse, and hyperlipidemia. Diagnosed with Covid 02/07/20.   HPI  Patient presents for post covid care visit today. Patient was diagnosed with Covid on 02/07/20, but states that symptoms started 02/03/20. Patient complains today of cough, shortness of breath, and wheezing. Patient states that he took some of his daughter's amoxicillin over the past couple of days and is starting to feel better. He does not know the dosage that he took. He has not been prescribed anything for his symptoms. He states that he still continues to have fevers every afternoon and night sweats. He is taking motrin to help with fevers. Denies n/v/d, hemoptysis, PND, leg swelling, chest pain or edema.     No Known Allergies  Immunization History  Administered Date(s) Administered  . Influenza Whole 08/30/2006  . Influenza-Unspecified 07/07/2017  . Td 04/08/2010  . Tdap 02/23/2019    Past Medical History:  Diagnosis Date  . Chronic constipation    intermittent  . Environmental allergies   . GERD   . Gout   . Hyperlipemia   . Hypertension   . Obesity   . OSA (obstructive sleep apnea) 05/23/2012   on CPAP  . Syncope    related to blood draws, stitches    Tobacco History: Social History   Tobacco Use  Smoking Status Former Smoker  . Years: 6.00  . Types: Cigarettes  . Quit date: 02/15/2006  . Years since quitting: 14.0  Smokeless Tobacco Never Used   Counseling given: Not Answered   Outpatient Encounter Medications as of 02/18/2020  Medication Sig  . losartan (COZAAR) 50 MG tablet  TAKE 1 TABLET BY MOUTH TWICE A DAY  . miconazole (MICATIN) 2 % cream Apply 1 application topically 2 (two) times daily.  . Omega-3 Fatty Acids (FISH OIL PO) Take by mouth.  Marland Kitchen omeprazole (PRILOSEC OTC) 20 MG tablet Take 20 mg by mouth as needed.   Marland Kitchen albuterol (VENTOLIN HFA) 108 (90 Base) MCG/ACT inhaler Inhale 2 puffs into the lungs every 6 (six) hours as needed for wheezing or shortness of breath.  . benzonatate (TESSALON) 100 MG capsule Take 1 capsule (100 mg total) by mouth 2 (two) times daily as needed for cough.  . doxycycline (VIBRA-TABS) 100 MG tablet Take 1 tablet (100 mg total) by mouth 2 (two) times daily.  . predniSONE (DELTASONE) 20 MG tablet Take 1 tablet (20 mg total) by mouth daily with breakfast for 5 days.   No facility-administered encounter medications on file as of 02/18/2020.     Review of Systems  Review of Systems  Constitutional: Positive for diaphoresis and fever.  HENT: Negative.   Respiratory: Positive for cough, shortness of breath and wheezing.   Cardiovascular: Negative.  Negative for chest pain, palpitations and leg swelling.  Gastrointestinal: Negative.   Allergic/Immunologic: Negative.   Neurological: Negative.   Psychiatric/Behavioral: Negative.        Physical Exam  BP 122/88 (BP Location: Left Arm, Patient Position: Sitting, Cuff Size: Large)   Pulse (!) 59  Temp (!) 96.9 F (36.1 C)   Ht 6\' 1"  (1.854 m)   Wt 254 lb 8 oz (115.4 kg)   SpO2 (!) 9%   BMI 33.58 kg/m   Wt Readings from Last 5 Encounters:  02/18/20 254 lb 8 oz (115.4 kg)  08/21/19 264 lb (119.7 kg)  07/09/19 261 lb (118.4 kg)  04/23/18 251 lb 8 oz (114.1 kg)  12/13/17 253 lb 3.2 oz (114.9 kg)     Physical Exam Vitals and nursing note reviewed.  Constitutional:      General: He is not in acute distress.    Appearance: He is well-developed.  Cardiovascular:     Rate and Rhythm: Normal rate and regular rhythm.  Pulmonary:     Effort: Pulmonary effort is normal. No  respiratory distress.     Breath sounds: Normal breath sounds. No wheezing or rhonchi.  Musculoskeletal:     Right lower leg: No edema.     Left lower leg: No edema.  Skin:    General: Skin is warm and dry.  Neurological:     Mental Status: He is alert and oriented to person, place, and time.  Psychiatric:        Mood and Affect: Mood normal.        Behavior: Behavior normal.      Lab Results:  CBC    Component Value Date/Time   WBC 5.9 08/21/2019 0825   RBC 4.46 08/21/2019 0825   HGB 13.5 08/21/2019 0825   HCT 40.1 08/21/2019 0825   PLT 213.0 08/21/2019 0825   MCV 89.8 08/21/2019 0825   MCH 29.1 05/16/2014 2157   MCHC 33.7 08/21/2019 0825   RDW 13.8 08/21/2019 0825   LYMPHSABS 1.9 08/21/2019 0825   MONOABS 0.4 08/21/2019 0825   EOSABS 0.1 08/21/2019 0825   BASOSABS 0.0 08/21/2019 0825    BMET    Component Value Date/Time   NA 137 08/21/2019 0825   K 4.5 08/21/2019 0825   CL 102 08/21/2019 0825   CO2 29 08/21/2019 0825   GLUCOSE 115 (H) 08/21/2019 0825   BUN 18 08/21/2019 0825   CREATININE 1.13 08/21/2019 0825   CALCIUM 9.5 08/21/2019 0825   GFRNONAA >90 05/16/2014 2157   GFRAA >90 05/16/2014 2157   ProBNP    Component Value Date/Time   PROBNP 17.4 05/16/2014 2157      Assessment & Plan:   COVID-19 Shortness of breath Cough Wheezing:  Will order doxycycline - concerned for developing pneumonia  Will order prednisone to start tomorrow  Depo medrol given in office today  Will order albuterol inhaler  May alternate tylenol and motrin for fever  Will order tessalon perles for cough  Stay well hydrated  Get plenty of rest  Follow - up: Follow up here in post covid care center in 2 weeks or sooner if needed      2158, NP 02/18/2020

## 2020-02-18 NOTE — Assessment & Plan Note (Signed)
Shortness of breath Cough Wheezing:  Will order doxycycline - concerned for developing pneumonia  Will order prednisone to start tomorrow  Depo medrol given in office today  Will order albuterol inhaler  May alternate tylenol and motrin for fever  Will order tessalon perles for cough  Stay well hydrated  Get plenty of rest  Follow - up: Follow up here in post covid care center in 2 weeks or sooner if needed

## 2020-02-18 NOTE — Addendum Note (Signed)
Addended by: Eduard Clos on: 02/18/2020 11:25 AM   Modules accepted: Orders

## 2020-02-18 NOTE — Progress Notes (Signed)
Created in error

## 2020-02-18 NOTE — Patient Instructions (Addendum)
Shortness of breath Cough Wheezing:  Will order doxycycline - concerned for developing pneumonia  Will order prednisone to start tomorrow  Depo medrol given in office today  Will order albuterol inhaler  May alternate tylenol and motrin for fever  Will order tessalon perles for cough  Stay well hydrated  Get plenty of rest  Follow - up: Follow up here in post covid care center in 2 weeks or sooner if needed   How to Use a Metered Dose Inhaler A metered dose inhaler is a handheld device for taking medicine that must be breathed into the lungs (inhaled). The device can be used to deliver a variety of inhaled medicines, including:  Quick relief or rescue medicines, such as bronchodilators.  Controller medicines, such as corticosteroids. The medicine is delivered by pushing down on a metal canister to release a preset amount of spray and medicine. Each device contains the amount of medicine that is needed for a preset number of uses (inhalations). Your health care provider may recommend that you use a spacer with your inhaler to help you take the medicine more effectively. A spacer is a plastic tube with a mouthpiece on one end and an opening that connects to the inhaler on the other end. A spacer holds the medicine in a tube for a short time, which allows you to inhale more medicine. What are the risks? If you do not use your inhaler correctly, medicine might not reach your lungs to help you breathe. Inhaler medicine can cause side effects, such as:  Mouth or throat infection.  Cough.  Hoarseness.  Headache.  Nausea and vomiting.  Lung infection (pneumonia) in people who have a lung condition called COPD. How to use a metered dose inhaler without a spacer  1. Remove the cap from the inhaler. 2. If you are using the inhaler for the first time, shake it for 5 seconds, turn it away from your face, then release 4 puffs into the air. This is called priming. 3. Shake the  inhaler for 5 seconds. 4. Position the inhaler so the top of the canister faces up. 5. Put your index finger on the top of the medicine canister. Support the bottom of the inhaler with your thumb. 6. Breathe out normally and as completely as possible, away from the inhaler. 7. Either place the inhaler between your teeth and close your lips tightly around the mouthpiece, or hold the inhaler 1-2 inches (2.5-5 cm) away from your open mouth. Keep your tongue down out of the way. If you are unsure which technique to use, ask your health care provider. 8. Press the canister down with your index finger to release the medicine, then inhale deeply and slowly through your mouth (not your nose) until your lungs are completely filled. Inhaling should take 4-6 seconds. 9. Hold the medicine in your lungs for 5-10 seconds (10 seconds is best). This helps the medicine get into the small airways of your lungs. 10. With your lips in a tight circle (pursed), breathe out slowly. 11. Repeat steps 3-10 until you have taken the number of puffs that your health care provider directed. Wait about 1 minute between puffs or as directed. 12. Put the cap on the inhaler. 13. If you are using a steroid inhaler, rinse your mouth with water, gargle, and spit out the water. Do not swallow the water. How to use a metered dose inhaler with a spacer  1. Remove the cap from the inhaler. 2. If you are  using the inhaler for the first time, shake it for 5 seconds, turn it away from your face, then release 4 puffs into the air. This is called priming. 3. Shake the inhaler for 5 seconds. 4. Place the open end of the spacer onto the inhaler mouthpiece. 5. Position the inhaler so the top of the canister faces up and the spacer mouthpiece faces you. 6. Put your index finger on the top of the medicine canister. Support the bottom of the inhaler and the spacer with your thumb. 7. Breathe out normally and as completely as possible, away from the  spacer. 8. Place the spacer between your teeth and close your lips tightly around it. Keep your tongue down out of the way. 9. Press the canister down with your index finger to release the medicine, then inhale deeply and slowly through your mouth (not your nose) until your lungs are completely filled. Inhaling should take 4-6 seconds. 10. Hold the medicine in your lungs for 5-10 seconds (10 seconds is best). This helps the medicine get into the small airways of your lungs. 11. With your lips in a tight circle (pursed), breathe out slowly. 12. Repeat steps 3-11 until you have taken the number of puffs that your health care provider directed. Wait about 1 minute between puffs or as directed. 13. Remove the spacer from the inhaler and put the cap on the inhaler. 14. If you are using a steroid inhaler, rinse your mouth with water, gargle, and spit out the water. Do not swallow the water. Follow these instructions at home:  Take your inhaled medicine only as told by your health care provider. Do not use the inhaler more than directed by your health care provider.  Keep all follow-up visits as told by your health care provider. This is important.  If your inhaler has a counter, you can check it to determine how full your inhaler is. If your inhaler does not have a counter, ask your health care provider when you will need to refill your inhaler and write the refill date on a calendar or on your inhaler canister. Note that you cannot know when an inhaler is empty by shaking it.  Follow directions on the package insert for care and cleaning of your inhaler and spacer. Contact a health care provider if:  Symptoms are only partially relieved with your inhaler.  You are having trouble using your inhaler.  You have an increase in phlegm.  You have headaches. Get help right away if:  You feel little or no relief after using your inhaler.  You have dizziness.  You have a fast heart rate.  You  have chills or a fever.  You have night sweats.  There is blood in your phlegm. Summary  A metered dose inhaler is a handheld device for taking medicine that must be breathed into the lungs (inhaled).  The medicine is delivered by pushing down on a metal canister to release a preset amount of spray and medicine.  Each device contains the amount of medicine that is needed for a preset number of uses (inhalations). This information is not intended to replace advice given to you by your health care provider. Make sure you discuss any questions you have with your health care provider. Document Revised: 10/05/2017 Document Reviewed: 09/12/2016 Elsevier Patient Education  2020 [employer].

## 2020-02-18 NOTE — Addendum Note (Signed)
Addended by: Eduard Clos on: 02/18/2020 11:15 AM   Modules accepted: Orders

## 2020-02-19 ENCOUNTER — Ambulatory Visit: Payer: Managed Care, Other (non HMO) | Admitting: Family Medicine

## 2020-03-03 ENCOUNTER — Ambulatory Visit: Payer: Managed Care, Other (non HMO)

## 2020-03-11 ENCOUNTER — Ambulatory Visit (INDEPENDENT_AMBULATORY_CARE_PROVIDER_SITE_OTHER): Payer: Managed Care, Other (non HMO) | Admitting: Nurse Practitioner

## 2020-03-11 VITALS — BP 112/72 | HR 60 | Temp 97.3°F | Wt 256.0 lb

## 2020-03-11 DIAGNOSIS — Z8616 Personal history of COVID-19: Secondary | ICD-10-CM | POA: Diagnosis not present

## 2020-03-11 NOTE — Assessment & Plan Note (Signed)
Assessment:  Patient is much improved. Patient walked in office today - O2 sats remained above 97% for entire walk - heart rate stable. Lung sounds clear in office today - will defer chest x ray at this time - per patient request   Plan:  May try mucinex DM twice daily  May try zyrtec daily for the next couple of weeks  Encouraged deep breathing exercises   Follow up:  Follow up as needed

## 2020-03-11 NOTE — Patient Instructions (Addendum)
Glad you are doing better!  May try mucinex DM twice daily  May try zyrtec daily for the next couple of weeks  Encouraged deep breathing exercises  Patient walked in office today - O2 sats remained above 97% for entire walk - heart rate stable  Lung sounds clear in office today - will defer chest x ray at this time - per patient request   Follow up:  Follow up as needed

## 2020-03-11 NOTE — Progress Notes (Signed)
@Patient  ID: Luke Hunt, male    DOB: 10/10/1971, 49 y.o.   MRN: [id number]  Chief Complaint  Patient presents with  . Follow-up    Per patient he stated he is feeling better. Only feels like his "lungs aren't clear". Stated he got the Ventolin and Tessalon but did not use    Referring provider: Billie Ruddy, MD   49 year old male with HTN, allergic rhinitis, OSA, GERD, Obesity, Alcohol abuse, and hyperlipidemia. Diagnosed with Covid 02/07/20.   HPI   Patient presents today for a follow-up visit. He states that he is much improved. Still complains of slight chest congestion. Patient denies any significant cough. Patient denies any recent fever. He is staying active. He has returned to work. He states that he completed his doxycycline and prednisone as directed, but did not need the inhaler or tessalon perles. Denies f/c/s, n/v/d, hemoptysis, PND, chest pain or edema.    Note: Patient walked in office today - O2 sats remained above 97% for entire walk - heart rate stable     No Known Allergies  Immunization History  Administered Date(s) Administered  . Influenza Whole 08/30/2006  . Influenza-Unspecified 07/07/2017  . Td 04/08/2010  . Tdap 02/23/2019    Past Medical History:  Diagnosis Date  . Chronic constipation    intermittent  . Environmental allergies   . GERD   . Gout   . Hyperlipemia   . Hypertension   . Obesity   . OSA (obstructive sleep apnea) 05/23/2012   on CPAP  . Syncope    related to blood draws, stitches    Tobacco History: Social History   Tobacco Use  Smoking Status Former Smoker  . Years: 6.00  . Types: Cigarettes  . Quit date: 02/15/2006  . Years since quitting: 14.0  Smokeless Tobacco Never Used   Counseling given: Yes   Outpatient Encounter Medications as of 03/11/2020  Medication Sig  . losartan (COZAAR) 50 MG tablet TAKE 1 TABLET BY MOUTH TWICE A DAY  . miconazole (MICATIN) 2 % cream Apply 1 application topically 2 (two) times  daily.  . Omega-3 Fatty Acids (FISH OIL PO) Take by mouth.  Marland Kitchen omeprazole (PRILOSEC OTC) 20 MG tablet Take 20 mg by mouth as needed.   Marland Kitchen albuterol (VENTOLIN HFA) 108 (90 Base) MCG/ACT inhaler Inhale 2 puffs into the lungs every 6 (six) hours as needed for wheezing or shortness of breath. (Patient not taking: Reported on 03/11/2020)  . benzonatate (TESSALON) 100 MG capsule Take 1 capsule (100 mg total) by mouth 2 (two) times daily as needed for cough. (Patient not taking: Reported on 03/11/2020)  . doxycycline (VIBRA-TABS) 100 MG tablet Take 1 tablet (100 mg total) by mouth 2 (two) times daily. (Patient not taking: Reported on 03/11/2020)   No facility-administered encounter medications on file as of 03/11/2020.     Review of Systems  Review of Systems  Constitutional: Positive for fever. Negative for chills.  HENT: Negative.   Respiratory: Negative for cough and shortness of breath.   Cardiovascular: Negative.   Gastrointestinal: Negative.   Allergic/Immunologic: Negative.   Neurological: Negative.   Psychiatric/Behavioral: Negative.        Physical Exam  BP 112/72 (BP Location: Left Arm, Patient Position: Sitting, Cuff Size: Large)   Pulse 60   Temp (!) 97.3 F (36.3 C)   Wt 256 lb (116.1 kg)   SpO2 98%   BMI 33.78 kg/m   Wt Readings from Last 5 Encounters:  03/11/20 256 lb (116.1 kg)  02/18/20 254 lb 8 oz (115.4 kg)  08/21/19 264 lb (119.7 kg)  07/09/19 261 lb (118.4 kg)  04/23/18 251 lb 8 oz (114.1 kg)     Physical Exam Vitals and nursing note reviewed.  Constitutional:      General: He is not in acute distress.    Appearance: He is well-developed.  Cardiovascular:     Rate and Rhythm: Normal rate and regular rhythm.  Pulmonary:     Effort: Pulmonary effort is normal.     Breath sounds: Normal breath sounds.  Skin:    General: Skin is warm and dry.  Neurological:     Mental Status: He is alert and oriented to person, place, and time.       Assessment & Plan:    History of COVID-19 Assessment:  Patient is much improved. Patient walked in office today - O2 sats remained above 97% for entire walk - heart rate stable. Lung sounds clear in office today - will defer chest x ray at this time - per patient request   Plan:  May try mucinex DM twice daily  May try zyrtec daily for the next couple of weeks  Encouraged deep breathing exercises   Follow up:  Follow up as needed      Ivonne Andrew, NP 03/11/2020

## 2020-04-07 ENCOUNTER — Other Ambulatory Visit: Payer: Self-pay | Admitting: Family Medicine

## 2020-09-01 ENCOUNTER — Other Ambulatory Visit: Payer: Self-pay | Admitting: Family Medicine

## 2020-11-25 ENCOUNTER — Encounter: Payer: Self-pay | Admitting: Family Medicine

## 2020-11-25 ENCOUNTER — Telehealth (INDEPENDENT_AMBULATORY_CARE_PROVIDER_SITE_OTHER): Payer: BC Managed Care – PPO | Admitting: Family Medicine

## 2020-11-25 DIAGNOSIS — J019 Acute sinusitis, unspecified: Secondary | ICD-10-CM | POA: Diagnosis not present

## 2020-11-25 MED ORDER — AMOXICILLIN-POT CLAVULANATE 500-125 MG PO TABS: 1.0000 | ORAL_TABLET | Freq: Two times a day (BID) | ORAL | 0 refills | Status: AC

## 2020-11-25 NOTE — Progress Notes (Signed)
Virtual Visit via Video Note  I connected with Luke Hunt on 11/25/20 at 10:30 AM EST by a video enabled telemedicine application 2/2 COVID-19 pandemic and verified that I am speaking with the correct person using two identifiers.  Location patient: home Location provider:work or home office Persons participating in the virtual visit: patient, provider  I discussed the limitations of evaluation and management by telemedicine and the availability of in person appointments. The patient expressed understanding and agreed to proceed.   HPI: Pt is a 50 yo male with pmh sig for HTN, OSA, GERD, h/o COVID infection, Gout, EtOH abuse, and constipation who is seen for acute concern. Pt states started feeling sick a few wks ago.  Had a negative rapid test.  On "Sunday pt started feeling bad. He has taken 3 rapid tests which have been negative. Pt with productive cough, pressure in face, eyes, and ears, rhinorrhea, sore throat, HAs,  Denies fever, n/v, diarrhea.  Pt unsure of sick contacts. Pt has taken Flonase, Afrin, generic pseudofed, vick's vapor run, and OTC cough and cold day and night time meds.   ROS: See pertinent positives and negatives per HPI.  Past Medical History:  Diagnosis Date  . Chronic constipation    intermittent  . Environmental allergies   . GERD   . Gout   . Hyperlipemia   . Hypertension   . Obesity   . OSA (obstructive sleep apnea) 05/23/2012   on CPAP  . Syncope    related to blood draws, stitches    Past Surgical History:  Procedure Laterality Date  . APPENDECTOMY    . Rigth knee arthroscopy     @ [facility]  . VASECTOMY     12" /2018-[facility]-per patient    Family History  Problem Relation Age of Onset  . Stroke Maternal Grandmother   . Diabetes Mother   . Breast cancer Mother   . Heart disease Maternal Grandfather   . Diabetes Maternal Grandfather   . Cystic fibrosis Maternal Aunt   . Diabetes Brother      Current Outpatient  Medications:  .  losartan (COZAAR) 50 MG tablet, TAKE 1 TABLET BY MOUTH TWICE A DAY, Disp: 180 tablet, Rfl: 0 .  Omega-3 Fatty Acids (FISH OIL PO), Take by mouth., Disp: , Rfl:  .  omeprazole (PRILOSEC OTC) 20 MG tablet, Take 20 mg by mouth as needed. , Disp: , Rfl:   EXAM:  VITALS per patient if applicable:  RR between 12-20 bpm  GENERAL: alert, oriented, appears well and in no acute distress  HEENT: atraumatic, conjunctiva clear, no obvious abnormalities on inspection of external nose and ears  NECK: normal movements of the head and neck  LUNGS: on inspection no signs of respiratory distress, breathing rate appears normal, no obvious gross SOB, gasping or wheezing  CV: no obvious cyanosis  MS: moves all visible extremities without noticeable abnormality  PSYCH/NEURO: pleasant and cooperative, no obvious depression or anxiety, speech and thought processing grossly intact  ASSESSMENT AND PLAN:  Discussed the following assessment and plan:  Subacute sinusitis, unspecified location  -continue supportive care -covid testing negative. - Plan: amoxicillin-clavulanate (AUGMENTIN) 500-125 MG tablet  F/u prn   I discussed the assessment and treatment plan with the patient. The patient was provided an opportunity to ask questions and all were answered. The patient agreed with the plan and demonstrated an understanding of the instructions.   The patient was advised to call back or seek an in-person evaluation if the  symptoms worsen or if the condition fails to improve as anticipated.  Deeann Saint, MD

## 2020-12-01 ENCOUNTER — Telehealth: Payer: Self-pay | Admitting: Family Medicine

## 2020-12-01 NOTE — Telephone Encounter (Signed)
Patient needs testosterone supplements or ED medication.  He talked to Dr. [place] last Thursday and forgot to mention it to her.  Patient is requesting a call back.

## 2020-12-08 NOTE — Telephone Encounter (Signed)
Pt has appointment with Dr Salomon Fick on 12/16/2020, will discuss about this problem on the visit

## 2020-12-16 ENCOUNTER — Encounter: Payer: Self-pay | Admitting: Family Medicine

## 2020-12-16 ENCOUNTER — Ambulatory Visit (INDEPENDENT_AMBULATORY_CARE_PROVIDER_SITE_OTHER): Payer: BC Managed Care – PPO | Admitting: Family Medicine

## 2020-12-16 ENCOUNTER — Other Ambulatory Visit: Payer: Self-pay

## 2020-12-16 VITALS — BP 118/76 | HR 75 | Temp 98.1°F | Ht 73.0 in | Wt 264.8 lb

## 2020-12-16 DIAGNOSIS — G4733 Obstructive sleep apnea (adult) (pediatric): Secondary | ICD-10-CM

## 2020-12-16 DIAGNOSIS — Z1159 Encounter for screening for other viral diseases: Secondary | ICD-10-CM

## 2020-12-16 DIAGNOSIS — Z0000 Encounter for general adult medical examination without abnormal findings: Secondary | ICD-10-CM

## 2020-12-16 DIAGNOSIS — E782 Mixed hyperlipidemia: Secondary | ICD-10-CM

## 2020-12-16 DIAGNOSIS — Z125 Encounter for screening for malignant neoplasm of prostate: Secondary | ICD-10-CM | POA: Diagnosis not present

## 2020-12-16 DIAGNOSIS — Z113 Encounter for screening for infections with a predominantly sexual mode of transmission: Secondary | ICD-10-CM | POA: Diagnosis not present

## 2020-12-16 DIAGNOSIS — N528 Other male erectile dysfunction: Secondary | ICD-10-CM

## 2020-12-16 DIAGNOSIS — R5383 Other fatigue: Secondary | ICD-10-CM | POA: Diagnosis not present

## 2020-12-16 DIAGNOSIS — I10 Essential (primary) hypertension: Secondary | ICD-10-CM | POA: Diagnosis not present

## 2020-12-16 DIAGNOSIS — Z9989 Dependence on other enabling machines and devices: Secondary | ICD-10-CM

## 2020-12-16 LAB — COMPREHENSIVE METABOLIC PANEL
ALT: 19 U/L (ref 0–53)
AST: 21 U/L (ref 0–37)
Albumin: 4.3 g/dL (ref 3.5–5.2)
Alkaline Phosphatase: 44 U/L (ref 39–117)
BUN: 12 mg/dL (ref 6–23)
CO2: 28 mEq/L (ref 19–32)
Calcium: 9.4 mg/dL (ref 8.4–10.5)
Chloride: 95 mEq/L — ABNORMAL LOW (ref 96–112)
Creatinine, Ser: 1.06 mg/dL (ref 0.40–1.50)
GFR: 82.74 mL/min (ref >60.00)
Glucose, Bld: 104 mg/dL — ABNORMAL HIGH (ref 70–99)
Potassium: 4.5 mEq/L (ref 3.5–5.1)
Sodium: 132 mEq/L — ABNORMAL LOW (ref 135–145)
Total Bilirubin: 1.3 mg/dL — ABNORMAL HIGH (ref 0.2–1.2)
Total Protein: 7.1 g/dL (ref 6.0–8.3)

## 2020-12-16 LAB — CBC WITH DIFFERENTIAL/PLATELET
Basophils Absolute: 0 10*3/uL (ref 0.0–0.1)
Basophils Relative: 0.7 % (ref 0.0–3.0)
Eosinophils Absolute: 0 10*3/uL (ref 0.0–0.7)
Eosinophils Relative: 0.9 % (ref 0.0–5.0)
HCT: 38.8 % — ABNORMAL LOW (ref 39.0–52.0)
Hemoglobin: 13.3 g/dL (ref 13.0–17.0)
Lymphocytes Relative: 40.9 % (ref 12.0–46.0)
Lymphs Abs: 2 10*3/uL (ref 0.7–4.0)
MCHC: 34.3 g/dL (ref 30.0–36.0)
MCV: 89.4 fl (ref 78.0–100.0)
Monocytes Absolute: 0.3 10*3/uL (ref 0.1–1.0)
Monocytes Relative: 7.2 % (ref 3.0–12.0)
Neutro Abs: 2.4 10*3/uL (ref 1.4–7.7)
Neutrophils Relative %: 50.3 % (ref 43.0–77.0)
Platelets: 209 10*3/uL (ref 150.0–400.0)
RBC: 4.35 Mil/uL (ref 4.22–5.81)
RDW: 13.9 % (ref 11.5–15.5)
WBC: 4.8 10*3/uL (ref 4.0–10.5)

## 2020-12-16 LAB — LIPID PANEL
Cholesterol: 243 mg/dL — ABNORMAL HIGH (ref 0–200)
HDL: 60.2 mg/dL (ref >39.00)
LDL Cholesterol: 148 mg/dL — ABNORMAL HIGH (ref 0–99)
NonHDL: 183.22
Total CHOL/HDL Ratio: 4
Triglycerides: 174 mg/dL — ABNORMAL HIGH (ref 0.0–149.0)
VLDL: 34.8 mg/dL (ref 0.0–40.0)

## 2020-12-16 LAB — TESTOSTERONE: Testosterone: 387.91 ng/dL (ref 300.00–890.00)

## 2020-12-16 LAB — PSA: PSA: 0.4 ng/mL (ref 0.10–4.00)

## 2020-12-16 LAB — TSH: TSH: 1.81 u[IU]/mL (ref 0.35–4.50)

## 2020-12-16 LAB — T4, FREE: Free T4: 0.9 ng/dL (ref 0.60–1.60)

## 2020-12-16 LAB — HEMOGLOBIN A1C: Hgb A1c MFr Bld: 5.8 % (ref 4.6–6.5)

## 2020-12-16 NOTE — Progress Notes (Signed)
Subjective:     Luke Hunt is a 50 y.o. male and is here for a comprehensive physical exam. Pt concerned he has low T.  States he is tired and having occasional inability to maintain an erection.  Able to achieve and maintain an erection by himself or in the past when casually dating as well as ejaculate.  Pt notes issues in past marriages may subconsciously play a role.  Dx'd with OSA 8 years ago, using CPAP nightly.  No recent setting adjustments.  Taking losartan 50 mg twice daily for HTN.  States has been on twice daily dosing for a while.  Does not have a bp cuff at home.  Social History   Socioeconomic History  . Marital status: Married    Spouse name: Not on file  . Number of children: 2  . Years of education: Not on file  . Highest education level: Not on file  Occupational History  . Occupation: Production designer, theatre/television/film distribution  Tobacco Use  . Smoking status: Former Smoker    Years: 6.00    Types: Cigarettes    Quit date: 02/15/2006    Years since quitting: 14.8  . Smokeless tobacco: Never Used  Substance and Sexual Activity  . Alcohol use: Yes    Alcohol/week: 14.0 standard drinks    Types: 14 Standard drinks or equivalent per week    Comment: 2 per day  . Drug use: No  . Sexual activity: Not on file  Other Topics Concern  . Not on file  Social History Narrative   Work or School: International aid/development worker      Home Situation: separated from wife in 2019; has son and daughter      Spiritual Beliefs: Christian      Lifestyle: regular exercise - heavy lifter; diet poor      Social Determinants of Health   Financial Resource Strain: Not on file  Food Insecurity: Not on file  Transportation Needs: Not on file  Physical Activity: Not on file  Stress: Not on file  Social Connections: Not on file  Intimate Partner Violence: Not on file   Health Maintenance  Topic Date Due  . COVID-19 Vaccine (1) 01/03/2022 (Originally 01/07/1976)  . INFLUENZA VACCINE  02/03/2022  (Originally 06/06/2020)  . COLONOSCOPY (Pts 45-21yrs Insurance coverage will need to be confirmed)  04/09/2023  . TETANUS/TDAP  02/22/2029  . Hepatitis C Screening  Completed  . HIV Screening  Completed    The following portions of the patient's history were reviewed and updated as appropriate: allergies, current medications, past family history, past medical history, past social history, past surgical history and problem list.  Review of Systems Pertinent items noted in HPI and remainder of comprehensive ROS otherwise negative.   Objective:    BP 118/76 (BP Location: Left Arm, Patient Position: Sitting, Cuff Size: Large)   Pulse 75   Temp 98.1 F (36.7 C) (Oral)   Ht 6\' 1"  (1.854 m)   Wt 264 lb 12.8 oz (120.1 kg)   SpO2 98%   BMI 34.94 kg/m  General appearance: alert, cooperative and no distress Head: Normocephalic, without obvious abnormality, atraumatic Eyes: conjunctivae/corneas clear. PERRL, EOM's intact. Fundi benign. Ears: normal TM's and external ear canals both ears Nose: Nares normal. Septum midline. Mucosa normal. No drainage or sinus tenderness. Throat: lips, mucosa, and tongue normal; teeth and gums normal Neck: no adenopathy, no carotid bruit, no JVD, supple, symmetrical, trachea midline and thyroid not enlarged, symmetric, no tenderness/mass/nodules Lungs: clear to  auscultation bilaterally Heart: regular rate and rhythm, S1, S2 normal, no murmur, click, rub or gallop Abdomen: soft, non-tender; bowel sounds normal; no masses,  no organomegaly Extremities: extremities normal, atraumatic, no cyanosis or edema Pulses: 2+ and symmetric Skin: Skin color, texture, turgor normal. No rashes or lesions Lymph nodes: Cervical, supraclavicular, and axillary nodes normal. Neurologic: Alert and oriented X 3, normal strength and tone. Normal symmetric reflexes. Normal coordination and gait    Assessment:    Healthy male exam with ED.      Plan:     Anticipatory guidance  given including wearing seatbelts, smoke detectors in the home, increasing physical activity, increasing p.o. intake of water and vegetables. -will obtain labs -colonoscopy completed 04/08/2018.   -given hanout -Next CPE in 1 yr See After Visit Summary for Counseling Recommendations    Essential hypertension -controlled -lifestyle modifications encouraged -OSA may be a contributory factor as CPAP setting may need adjusting. -discussed losartan dosing.  Pt historically on BID dosing from prior prescriber. -continue losartan 50 mg BID, will try to adjust dose to daily dosing if tolerated. -continue to monitor  Other male erectile dysfunction -discussed possible causes including psychologic, medications, etc -given handout -consider f/u with Urology  - Plan: CBC with Differential/Platelet, Testosterone  OSA on CPAP  -dx'd 8 yrs ago -new sleep study recommended for proper adjustments to setting and mask. - Plan: Ambulatory referral to Sleep Studies  Fatigue, unspecified type  - Plan: CMP, TSH, T4, Free, Hemoglobin A1c, Testosterone  Mixed hyperlipidemia  -continue lifestyle modifications -continue fish oil tablets - Plan: Lipid panel  Screening for prostate cancer  - Plan: PSA  Routine screening for STI (sexually transmitted infection)  - Plan: HIV Antibody (routine testing w rflx), RPR, C. trachomatis/N. gonorrhoeae RNA  Encounter for hepatitis C screening test for low risk patient  - Plan: Hep C Antibody  F/u prn in 1-2 months  Abbe Amsterdam, MD

## 2020-12-16 NOTE — Patient Instructions (Addendum)
Preventive Care 40-49 Years Old, Male Preventive care refers to lifestyle choices and visits with your health care provider that can promote health and wellness. This includes:  A yearly physical exam. This is also called an annual wellness visit.  Regular dental and eye exams.  Immunizations.  Screening for certain conditions.  Healthy lifestyle choices, such as: ? Eating a healthy diet. ? Getting regular exercise. ? Not using drugs or products that contain nicotine and tobacco. ? Limiting alcohol use. What can I expect for my preventive care visit? Physical exam Your health care provider will check your:  Height and weight. These may be used to calculate your BMI (body mass index). BMI is a measurement that tells if you are at a healthy weight.  Heart rate and blood pressure.  Body temperature.  Skin for abnormal spots. Counseling Your health care provider may ask you questions about your:  Past medical problems.  Family's medical history.  Alcohol, tobacco, and drug use.  Emotional well-being.  Home life and relationship well-being.  Sexual activity.  Diet, exercise, and sleep habits.  Work and work environment.  Access to firearms. What immunizations do I need? Vaccines are usually given at various ages, according to a schedule. Your health care provider will recommend vaccines for you based on your age, medical history, and lifestyle or other factors, such as travel or where you work.   What tests do I need? Blood tests  Lipid and cholesterol levels. These may be checked every 5 years, or more often if you are over 50 years old.  Hepatitis C test.  Hepatitis B test. Screening  Lung cancer screening. You may have this screening every year starting at age 55 if you have a 30-pack-year history of smoking and currently smoke or have quit within the past 15 years.  Prostate cancer screening. Recommendations will vary depending on your family history and  other risks.  Genital exam to check for testicular cancer or hernias.  Colorectal cancer screening. ? All adults should have this screening starting at age 50 and continuing until age 75. ? Your health care provider may recommend screening at age 45 if you are at increased risk. ? You will have tests every 1-10 years, depending on your results and the type of screening test.  Diabetes screening. ? This is done by checking your blood sugar (glucose) after you have not eaten for a while (fasting). ? You may have this done every 1-3 years.  STD (sexually transmitted disease) testing, if you are at risk. Follow these instructions at home: Eating and drinking  Eat a diet that includes fresh fruits and vegetables, whole grains, lean protein, and low-fat dairy products.  Take vitamin and mineral supplements as recommended by your health care provider.  Do not drink alcohol if your health care provider tells you not to drink.  If you drink alcohol: ? Limit how much you have to 0-2 drinks a day. ? Be aware of how much alcohol is in your drink. In the [place] one drink equals one 12 oz bottle of beer (355 mL), one 5 oz glass of wine (148 mL), or one 1 oz glass of hard liquor (44 mL).   Lifestyle  Take daily care of your teeth and gums. Brush your teeth every morning and night with fluoride toothpaste. Floss one time each day.  Stay active. Exercise for at least 30 minutes 5 or more days each week.  Do not use any products that contain nicotine or   tobacco, such as cigarettes, e-cigarettes, and chewing tobacco. If you need help quitting, ask your health care provider.  Do not use drugs.  If you are sexually active, practice safe sex. Use a condom or other form of protection to prevent STIs (sexually transmitted infections).  If told by your health care provider, take low-dose aspirin daily starting at age 97.  Find healthy ways to cope with stress, such as: ? Meditation, yoga, or  listening to music. ? Journaling. ? Talking to a trusted person. ? Spending time with friends and family. Safety  Always wear your seat belt while driving or riding in a vehicle.  Do not drive: ? If you have been drinking alcohol. Do not ride with someone who has been drinking. ? When you are tired or distracted. ? While texting.  Wear a helmet and other protective equipment during sports activities.  If you have firearms in your house, make sure you follow all gun safety procedures. What's next?  Go to your health care provider once a year for an annual wellness visit.  Ask your health care provider how often you should have your eyes and teeth checked.  Stay up to date on all vaccines. This information is not intended to replace advice given to you by your health care provider. Make sure you discuss any questions you have with your health care provider. Document Revised: 07/22/2019 Document Reviewed: 10/17/2018 Elsevier Patient Education  2021 [place].  How to Take Your Blood Pressure Blood pressure measures how strongly your blood is pressing against the walls of your arteries. Arteries are blood vessels that carry blood from your heart throughout your body. You can take your blood pressure at home with a machine. You may need to check your blood pressure at home:  To check if you have high blood pressure (hypertension).  To check your blood pressure over time.  To make sure your blood pressure medicine is working. Supplies needed:  Blood pressure machine, or monitor.  Dining room chair to sit in.  Table or desk.  Small notebook.  Pencil or pen. How to prepare Avoid these things for 30 minutes before checking your blood pressure:  Having drinks with caffeine in them, such as coffee or tea.  Drinking alcohol.  Eating.  Smoking.  Exercising. Do these things five minutes before checking your blood pressure:  Go to the bathroom and pee  (urinate).  Sit in a dining chair. Do not sit in a soft couch or an armchair.  Be quiet. Do not talk. How to take your blood pressure Follow the instructions that came with your machine. If you have a digital blood pressure monitor, these may be the instructions: 1. Sit up straight. 2. Place your feet on the floor. Do not cross your ankles or legs. 3. Rest your left arm at the level of your heart. You may rest it on a table, desk, or chair. 4. Pull up your shirt sleeve. 5. Wrap the blood pressure cuff around the upper part of your left arm. The cuff should be 1 inch (2.5 cm) above your elbow. It is best to wrap the cuff around bare skin. 6. Fit the cuff snugly around your arm. You should be able to place only one finger between the cuff and your arm. 7. Place the cord so that it rests in the bend of your elbow. 8. Press the power button. 9. Sit quietly while the cuff fills with air and loses air. 10. Write down the  numbers on the screen. 11. Wait 2-3 minutes and then repeat steps 1-10.   What do the numbers mean? Two numbers make up your blood pressure. The first number is called systolic pressure. The second is called diastolic pressure. An example of a blood pressure reading is "120 over 80" (or 120/80). If you are an adult and do not have a medical condition, use this guide to find out if your blood pressure is normal: Normal  First number: below 120.  Second number: below 80. Elevated  First number: 120-129.  Second number: below 80. Hypertension stage 1  First number: 130-139.  Second number: 80-89. Hypertension stage 2  First number: 140 or above.  Second number: 90 or above. Your blood pressure is above normal even if only the top or bottom number is above normal. Follow these instructions at home:  Check your blood pressure as often as your doctor tells you to.  Check your blood pressure at the same time every day.  Take your monitor to your next doctor's  appointment. Your doctor will: ? Make sure you are using it correctly. ? Make sure it is working right.  Make sure you understand what your blood pressure numbers should be.  Tell your doctor if your medicine is causing side effects.  Keep all follow-up visits as told by your doctor. This is important. General tips:  You will need a blood pressure machine, or monitor. Your doctor can suggest a monitor. You can buy one at a drugstore or online. When choosing one: ? Choose one with an arm cuff. ? Choose one that wraps around your upper arm. Only one finger should fit between your arm and the cuff. ? Do not choose one that measures your blood pressure from your wrist or finger. Where to find more information American Heart Association: www.heart.org Contact a doctor if:  Your blood pressure keeps being high. Get help right away if:  Your first blood pressure number is higher than 180.  Your second blood pressure number is higher than 120. Summary  Check your blood pressure at the same time every day.  Avoid caffeine, alcohol, smoking, and exercise for 30 minutes before checking your blood pressure.  Make sure you understand what your blood pressure numbers should be. This information is not intended to replace advice given to you by your health care provider. Make sure you discuss any questions you have with your health care provider. Document Revised: 10/17/2019 Document Reviewed: 10/17/2019 Elsevier Patient Education  2021 [employer] Inc.  Erectile Dysfunction Erectile dysfunction (ED) is the inability to get or keep an erection in order to have sexual intercourse. ED is considered a symptom of an underlying disorder and not considered a disease. Erectile dysfunction may include:  Inability to get an erection.  Lack of enough hardness of the erection to allow penetration.  Loss of the erection before sex is finished. What are the causes? This condition may be caused  by:  Certain medicines, such as: ? Pain relievers. ? Antihistamines. ? Antidepressants. ? Blood pressure medicines. ? Water pills (diuretics). ? Ulcer medicines. ? Muscle relaxants. ? Drugs.  Excessive drinking.  Psychological causes, such as: ? Anxiety. ? Depression. ? Sadness. ? Exhaustion. ? Performance fear. ? Stress.  Physical causes, such as: ? Artery problems. This may include diabetes, smoking, liver disease, or atherosclerosis. ? High blood pressure. ? Hormonal problems, such as low testosterone. ? Obesity. ? Nerve problems. This may include back or pelvic injuries, diabetes mellitus, multiple  sclerosis, or Parkinson's disease. What are the signs or symptoms? Symptoms of this condition include:  Inability to get an erection.  Lack of enough hardness of the erection to allow penetration.  Loss of the erection before sex is finished.  Normal erections at some times, but with frequent unsatisfactory episodes.  Low sexual satisfaction in either partner due to erection problems.  A curved penis occurring with erection. The curve may cause pain or the penis may be too curved to allow for intercourse.  Never having nighttime erections. How is this diagnosed? This condition is often diagnosed by:  Performing a physical exam to find other diseases or specific problems with the penis.  Asking you detailed questions about the problem.  Performing blood tests to check for diabetes mellitus or to measure hormone levels.  Performing other tests to check for underlying health conditions.  Performing an ultrasound exam to check for scarring.  Performing a test to check blood flow to the penis.  Doing a sleep study at home to measure nighttime erections. How is this treated? This condition may be treated by:  Medicine taken by mouth to help you achieve an erection (oral medicine).  Hormone replacement therapy to replace low testosterone levels.  Medicine  that is injected into the penis. Your health care provider may instruct you how to give yourself these injections at home.  Vacuum pump. This is a pump with a ring on it. The pump and ring are placed on the penis and used to create pressure that helps the penis become erect.  Penile implant surgery. In this procedure, you may receive: ? An inflatable implant. This consists of cylinders, a pump, and a reservoir. The cylinders can be inflated with a fluid that helps to create an erection, and they can be deflated after intercourse. ? A semi-rigid implant. This consists of two silicone rubber rods. The rods provide some rigidity. They are also flexible, so the penis can both curve downward in its normal position and become straight for sexual intercourse.  Blood vessel surgery, to improve blood flow to the penis. During this procedure, a blood vessel from a different part of the body is placed into the penis to allow blood to flow around (bypass) damaged or blocked blood vessels.  Lifestyle changes, such as exercising more, losing weight, and quitting smoking. Follow these instructions at home: Medicines  Take over-the-counter and prescription medicines only as told by your health care provider. Do not increase the dosage without first discussing it with your health care provider.  If you are using self-injections, perform injections as directed by your health care provider. Make sure to avoid any veins that are on the surface of the penis. After giving an injection, apply pressure to the injection site for 5 minutes.   General instructions  Exercise regularly, as directed by your health care provider. Work with your health care provider to lose weight, if needed.  Do not use any products that contain nicotine or tobacco, such as cigarettes and e-cigarettes. If you need help quitting, ask your health care provider.  Before using a vacuum pump, read the instructions that come with the pump and  discuss any questions with your health care provider.  Keep all follow-up visits as told by your health care provider. This is important. Contact a health care provider if:  You feel nauseous.  You vomit. Get help right away if:  You are taking oral or injectable medicines and you have an erection that lasts  longer than 4 hours. If your health care provider is unavailable, go to the nearest emergency room for evaluation. An erection that lasts much longer than 4 hours can result in permanent damage to your penis.  You have severe pain in your groin or abdomen.  You develop redness or severe swelling of your penis.  You have redness spreading up into your groin or lower abdomen.  You are unable to urinate.  You experience chest pain or a rapid heart beat (palpitations) after taking oral medicines. Summary  Erectile dysfunction (ED) is the inability to get or keep an erection during sexual intercourse. This problem can usually be treated successfully.  This condition is diagnosed based on a physical exam, your symptoms, and tests to determine the cause. Treatment varies depending on the cause and may include medicines, hormone therapy, surgery, or a vacuum pump.  You may need follow-up visits to make sure that you are using your medicines or devices correctly.  Get help right away if you are taking or injecting medicines and you have an erection that lasts longer than 4 hours. This information is not intended to replace advice given to you by your health care provider. Make sure you discuss any questions you have with your health care provider. Document Revised: 07/09/2020 Document Reviewed: 07/09/2020 Elsevier Patient Education  2021 [place], Adult Feeling a certain amount of stress is normal. Stress helps our body and mind get ready to deal with the demands of life. Stress hormones can motivate you to do well at work and meet your responsibilities. However  severe or long-lasting (chronic) stress can affect your mental and physical health. Chronic stress puts you at higher risk for anxiety, depression, and other health problems like digestive problems, muscle aches, heart disease, high blood pressure, and stroke. What are the causes? Common causes of stress include:  Demands from work, such as deadlines, feeling overworked, or having long hours.  Pressures at home, such as money issues, disagreements with a spouse, or parenting issues.  Pressures from major life changes, such as divorce, moving, loss of a loved one, or chronic illness. You may be at higher risk for stress-related problems if you do not get enough sleep, are in poor health, do not have emotional support, or have a mental health disorder like anxiety or depression. How to recognize stress Stress can make you:  Have trouble sleeping.  Feel sad, anxious, irritable, or overwhelmed.  Lose your appetite.  Overeat or want to eat unhealthy foods.  Want to use drugs or alcohol. Stress can also cause physical symptoms, such as:  Sore, tense muscles, especially in the shoulders and neck.  Headaches.  Trouble breathing.  A faster heart rate.  Stomach pain, nausea, or vomiting.  Diarrhea or constipation.  Trouble concentrating. Follow these instructions at home: Lifestyle  Identify the source of your stress and your reaction to it. See a therapist who can help you change your reactions.  When there are stressful events: ? Talk about it with family, friends, or co-workers. ? Try to think realistically about stressful events and not ignore them or overreact. ? Try to find the positives in a stressful situation and not focus on the negatives. ? Cut back on responsibilities at work and home, if possible. Ask for help from friends or family members if you need it.  Find ways to cope with stress, such as: ? Meditation. ? Deep breathing. ? Yoga or tai chi. ? Progressive  muscle  relaxation. ? Doing art, playing music, or reading. ? Making time for fun activities. ? Spending time with family and friends.  Get support from family, friends, or spiritual resources. Eating and drinking  Eat a healthy diet. This includes: ? Eating foods that are high in fiber, such as beans, whole grains, and fresh fruits and vegetables. ? Limiting foods that are high in fat and processed sugars, such as fried and sweet foods.  Do not skip meals or overeat.  Drink enough fluid to keep your urine pale yellow. Alcohol use  Do not drink alcohol if: ? Your health care provider tells you not to drink. ? You are pregnant, may be pregnant, or are planning to become pregnant.  Drinking alcohol is a way some people try to ease their stress. This can be dangerous, so if you drink alcohol: ? Limit how much you use to:  0-1 drink a day for women.  0-2 drinks a day for men. ? Be aware of how much alcohol is in your drink. In the [place] one drink equals one 12 oz bottle of beer (355 mL), one 5 oz glass of wine (148 mL), or one 1 oz glass of hard liquor (44 mL). Activity  Include 30 minutes of exercise in your daily schedule. Exercise is a good stress reducer.  Include time in your day for an activity that you find relaxing. Try taking a walk, going on a bike ride, reading a book, or listening to music.  Schedule your time in a way that lowers stress, and keep a consistent schedule. Prioritize what is most important to get done.   General instructions  Get enough sleep. Try to go to sleep and get up at about the same time every day.  Take over-the-counter and prescription medicines only as told by your health care provider.  Do not use any products that contain nicotine or tobacco, such as cigarettes, e-cigarettes, and chewing tobacco. If you need help quitting, ask your health care provider.  Do not use drugs or smoke to cope with stress.  Keep all follow-up visits as told by  your health care provider. This is important. Where to find support  Talk with your health care provider about stress management or finding a support group.  Find a therapist to work with you on your stress management techniques. Contact a health care provider if:  Your stress symptoms get worse.  You are unable to manage your stress at home.  You are struggling to stop using drugs or alcohol. Get help right away if:  You may be a danger to yourself or others.  You have any thoughts of death or suicide. If you ever feel like you may hurt yourself or others, or have thoughts about taking your own life, get help right away. You can go to your nearest emergency department or call:  Your local emergency services (911 in the [place]  A suicide crisis helpline, such as the National Suicide Prevention Lifeline at (580)573-3326. This is open 24 hours a day. Summary  Feeling a certain amount of stress is normal, but severe or long-lasting (chronic) stress can affect your mental and physical health.  Chronic stress can put you at higher risk for anxiety, depression, and other health problems like digestive problems, muscle aches, heart disease, high blood pressure, and stroke.  You may be at higher risk for stress-related problems if you do not get enough sleep, are in poor health, lack emotional support, or have a mental  health disorder like anxiety or depression.  Identify the source of your stress and your reaction to it. Try talking about stressful events with family, friends, or co-workers, finding a coping method, or getting support from spiritual resources.  If you need more help, talk with your health care provider about finding a support group or a mental health therapist. This information is not intended to replace advice given to you by your health care provider. Make sure you discuss any questions you have with your health care provider. Document Revised: 05/21/2019 Document  Reviewed: 05/21/2019 Elsevier Patient Education  [place].

## 2020-12-17 LAB — HIV ANTIBODY (ROUTINE TESTING W REFLEX): HIV 1&2 Ab, 4th Generation: NONREACTIVE

## 2020-12-17 LAB — C. TRACHOMATIS/N. GONORRHOEAE RNA
C. trachomatis RNA, TMA: NOT DETECTED
N. gonorrhoeae RNA, TMA: NOT DETECTED

## 2020-12-17 LAB — RPR: RPR Ser Ql: NONREACTIVE

## 2020-12-17 LAB — HEPATITIS C ANTIBODY
Hepatitis C Ab: NONREACTIVE
SIGNAL TO CUT-OFF: 0.03 (ref <1.00)

## 2020-12-25 ENCOUNTER — Other Ambulatory Visit: Payer: Self-pay | Admitting: Family Medicine

## 2020-12-26 ENCOUNTER — Encounter: Payer: Self-pay | Admitting: Family Medicine

## 2021-02-02 ENCOUNTER — Telehealth: Payer: Self-pay | Admitting: Family Medicine

## 2021-02-02 NOTE — Telephone Encounter (Signed)
Rx is not on med list. No notes from OV last month about starting/sending Ambien to pharmacy.

## 2021-02-02 NOTE — Telephone Encounter (Signed)
Pt call and stated he want to know will dr.Banks sent a RX to  [id number] [place], [place] - [address] Phone:  [phone]  Fax:  [phone]     for Ambien and want a call back.

## 2021-02-07 NOTE — Telephone Encounter (Signed)
The patient called to see what the response was from his last message on 03/30 and I asked him if Dr. Salomon Fick has prescribed this for him before and he said no and I suggested that he will probably have to make an appointment and he said that's crazy that he has to spend $300 for medication and that Dr. Salomon Fick will never give him what he wants and that he will probably find another doctor.  FYI

## 2021-02-10 NOTE — Telephone Encounter (Signed)
Sounds like that may be best.

## 2021-03-07 ENCOUNTER — Encounter: Payer: Self-pay | Admitting: Family Medicine

## 2021-03-07 ENCOUNTER — Other Ambulatory Visit: Payer: Self-pay

## 2021-03-07 ENCOUNTER — Ambulatory Visit (INDEPENDENT_AMBULATORY_CARE_PROVIDER_SITE_OTHER): Payer: BC Managed Care – PPO | Admitting: Family Medicine

## 2021-03-07 VITALS — BP 140/98 | HR 76 | Temp 98.2°F | Wt 255.8 lb

## 2021-03-07 DIAGNOSIS — I10 Essential (primary) hypertension: Secondary | ICD-10-CM

## 2021-03-07 DIAGNOSIS — G4733 Obstructive sleep apnea (adult) (pediatric): Secondary | ICD-10-CM | POA: Diagnosis not present

## 2021-03-07 DIAGNOSIS — G4700 Insomnia, unspecified: Secondary | ICD-10-CM

## 2021-03-07 DIAGNOSIS — K5909 Other constipation: Secondary | ICD-10-CM

## 2021-03-07 MED ORDER — HYDROXYZINE PAMOATE 50 MG PO CAPS: 50.0000 mg | ORAL_CAPSULE | Freq: Every evening | ORAL | 0 refills | Status: DC | PRN

## 2021-03-07 MED ORDER — LUBIPROSTONE 8 MCG PO CAPS: 8.0000 ug | ORAL_CAPSULE | Freq: Two times a day (BID) | ORAL | 1 refills | Status: DC

## 2021-03-07 NOTE — Progress Notes (Signed)
Subjective:    Patient ID: Luke Hunt, male    DOB: 11/04/1971, 50 y.o.   MRN: [id number]  Chief Complaint  Patient presents with  . Insomnia  . Constipation    HPI Patient was seen today for ongoing concerns. Pt notes insomnia.  Tried OTC meds without success.   Pt also with chronic constipation.  States prune juice daily no longer working.  Linzess causes pt to be in the bathroom x 4 hrs which makes it difficult when he is at work.  Seen by GI in the past.  States miralax and other meds and diet changes were not helpful.  Pt requesting a new med.  Pt mentions occasional flush feeling.  Taking supplements such as Milk thistle and niacin to "cleanse his liver".   Of note: pt called on 02/02/2021 requesting Ambien be sent to pharmacy.  Pt advised to make an appt as rx for med never written by this provider nor insomnia discussed.  Pt frustrated that he can never get what he wants and he will probably find a new provider.  Pt advised that may be best.  Past Medical History:  Diagnosis Date  . Chronic constipation    intermittent  . Environmental allergies   . GERD   . Gout   . Hyperlipemia   . Hypertension   . Obesity   . OSA (obstructive sleep apnea) 05/23/2012   on CPAP  . Syncope    related to blood draws, stitches    No Known Allergies  ROS General: Denies fever, chills, night sweats, changes in weight, changes in appetite  +insomnia HEENT: Denies headaches, ear pain, changes in vision, rhinorrhea, sore throat CV: Denies CP, palpitations, SOB, orthopnea Pulm: Denies SOB, cough, wheezing GI: Denies abdominal pain, nausea, vomiting, diarrhea  +chronic constipation GU: Denies dysuria, hematuria, frequency Msk: Denies muscle cramps, joint pains Neuro: Denies weakness, numbness, tingling Skin: Denies rashes, bruising Psych: Denies depression, anxiety, hallucinations      Objective:    Blood pressure (!) 140/98, pulse 76, temperature 98.2 F (36.8 C), temperature source  Oral, weight 255 lb 12.8 oz (116 kg), SpO2 98 %.  Gen. Pleasant, well-nourished, in no distress, normal affect   HEENT: [place]/AT, face symmetric, conjunctiva clear, no scleral icterus, PERRLA, EOMI, nares patent without drainage Lungs: no accessory muscle use Cardiovascular: RRR, no peripheral edema Musculoskeletal: No deformities, no cyanosis or clubbing, normal tone Neuro:  A&Ox3, CN II-XII intact, normal gait Skin:  Warm, no lesions/ rash   Wt Readings from Last 3 Encounters:  12/16/20 264 lb 12.8 oz (120.1 kg)  03/11/20 256 lb (116.1 kg)  02/18/20 254 lb 8 oz (115.4 kg)    Lab Results  Component Value Date   WBC 4.8 12/16/2020   HGB 13.3 12/16/2020   HCT 38.8 (L) 12/16/2020   PLT 209.0 12/16/2020   GLUCOSE 104 (H) 12/16/2020   CHOL 243 (H) 12/16/2020   TRIG 174.0 (H) 12/16/2020   HDL 60.20 12/16/2020   LDLDIRECT 141.9 06/30/2013   LDLCALC 148 (H) 12/16/2020   ALT 19 12/16/2020   AST 21 12/16/2020   NA 132 (L) 12/16/2020   K 4.5 12/16/2020   CL 95 (L) 12/16/2020   CREATININE 1.06 12/16/2020   BUN 12 12/16/2020   CO2 28 12/16/2020   TSH 1.81 12/16/2020   PSA 0.40 12/16/2020   INR 1.1 (H) 04/23/2018   HGBA1C 5.8 12/16/2020    Assessment/Plan:  Chronic constipation  -low FODMAP diet, probiotic -will start Amitiza.   -  For continued or worsening symptoms follow-up with GI. - Plan: lubiprostone (AMITIZA) 8 MCG capsule  Insomnia, unspecified type  -sleep hygiene  -advised to avoid EtOH - Plan: hydrOXYzine (VISTARIL) 50 MG capsule  Essential hypertension -elevated  -continue losartan 50 mg  -lifestyle modifications  OSA (obstructive sleep apnea) -wear CPAP -repeat sleep study ordered at previous [facility], however pt declined appt. -wt loss advised.  F/u prn in 1 month  Abbe Amsterdam, MD

## 2021-03-07 NOTE — Patient Instructions (Signed)
Chronic Constipation Chronic constipation is a condition in which a person has three or fewer bowel movements a week, for 3 months or longer. This condition is especially common in older adults. What are the causes? Causes of chronic constipation may include:  Not drinking enough fluid, eating enough food or fiber, or getting enough physical activity.  Pregnancy.  A tear in the anus (anal fissure).  Blockage in the bowel (bowel obstruction).  Narrowing of the bowel (bowel stricture).  Having a long-term medical condition, such as: ? Diabetes, hypothyroidism, or iron-deficiency anemia. ? Stroke or spinal cord injury. ? Multiple sclerosis or Parkinson's disease. ? Colon cancer. ? Dementia. ? Inflammatory bowel disease (IBD), outward collapse of the rectum (rectal prolapse), or hemorrhoids.  Taking certain medicines, including: ? Narcotics. These are a certain type of prescription pain medicine. ? Antacids or iron supplements. ? Water pills (diuretics). ? Certain blood pressure medicines. ? Anti-seizure medicines. ? Antidepressants. ? Medicines for Parkinson's disease. Other causes of this condition may include:  Stress.  Problems in the nerves and muscles that control the movement of stool.  Weak or impaired pelvic floor muscles.   What increases the risk? You may be at higher risk for chronic constipation if:  You are older than age 87.  You are male.  You live in a long-term care facility.  You have a long-term disease.  You have a mental health disorder or eating disorder. What are the signs or symptoms? The main symptom of chronic constipation is having three or fewer bowel movements a week for several weeks. Other signs and symptoms may vary from person to person. These include:  Pushing hard (straining) to pass stool, or having hard or lumpy stools.  Painful bowel movements.  Having lower abdominal discomfort, such as cramps or bloating.  Being unable  to have a bowel movement when you feel the urge, or feeling like you still need to pass stool after a bowel movement.  Feeling that you have something in your rectum that is blocking or preventing bowel movements.  Seeing blood on the toilet paper or in your stool.  Worsening confusion (in older adults). How is this diagnosed? This condition may be diagnosed based on:  Your symptoms and medical history. You will be asked about your symptoms, lifestyle, diet, and any medicines that you are taking.  A physical exam. ? Your abdomen will be examined. ? A digital rectal exam may be done. For this exam, a health care provider places a lubricated, gloved finger into the rectum.  Tests to check for any underlying causes of your constipation. These may be ordered if you have bleeding in your rectum, weight loss, or a family history of colon cancer. In these cases, you may have: ? Imaging studies of the colon. These may include X-ray, ultrasound, or a CT scan. ? Blood tests. ? A procedure to examine the inside of your colon (colonoscopy). ? More specialized tests to check:  Whether your anal sphincter works well. This is a ring-shaped muscle that controls the closing of the anus.  How well food moves through your colon. ? Tests to measure the nerve signal in your pelvic floor muscles (electromyography). How is this treated? Treatment for chronic constipation depends on the cause. Most often, treatment starts with:  Being more active and getting regular exercise.  Drinking more fluids.  Adding fiber to your diet. Sources of fiber include fruits, vegetables, whole grains, and fiber supplements.  Using medicines such as stool softeners  or medicines that increase contractions in your digestive system (pro-motility agents).  Training your pelvic muscles with biofeedback.  Surgery, if there is obstruction. Treatment may also include:  Stopping or changing some medicines if they cause  constipation.  Using a fiber supplement (bulk laxative) or stool softener.  Using a prescription laxative. This works by [employer] Companyabsorbing water into your colon (osmotic laxative). You may also need to see a specialist who treats conditions of the digestive system (gastroenterologist).   Follow these instructions at home: Medicines  Take over-the-counter and prescription medicines only as told by your health care provider.  If you are taking a laxative, take it as told by your health care provider. Eating and drinking  Eat a balanced diet that includes enough fiber. Ask your health care provider to recommend a diet that is right for you.  Drink clear fluids, especially water. Avoid drinking alcohol, caffeine, and soda. These can make constipation worse.  Drink enough fluid to keep your urine pale yellow.   General instructions  Get some physical activity every day. Ask your health care provider what activities are safe for you.  Get colon cancer screenings as told by your health care provider.  Keep all follow-up visits as told by your health care provider. This is important. Contact a health care provider if you have:  Three or fewer bowel movements a week.  Stools that are hard or lumpy.  Blood on the toilet paper or in your stool after you have a bowel movement.  Unexplained weight loss.  Rectum (rectal) pain.  Stool leakage.  Nausea or vomiting. Get help right away if you have:  Rectal bleeding or you pass blood clots.  Severe rectal pain.  Body tissue that pushes out (protrudes) from your anus.  Severe pain or bloating (distension) in your abdomen.  Vomiting that you cannot control. Summary  Chronic constipation is a condition in which a person has three or fewer bowel movements a week, for 3 months or longer.  You may have a higher risk for this condition if you are an older adult, you are male, or you have a long-term disease.  Treatment for this condition  depends on the cause. Most treatments for chronic constipation include adding fiber to your diet, drinking more fluids, and getting more physical activity. You may also need to treat any underlying medical conditions or stop or change certain medicines if they cause constipation.  If lifestyle changes do not relieve constipation, your health care provider may recommend taking a laxative. This information is not intended to replace advice given to you by your health care provider. Make sure you discuss any questions you have with your health care provider. Document Revised: 09/10/2019 Document Reviewed: 09/10/2019 Elsevier Patient Education  2021 Elsevier Inc.  Insomnia Insomnia is a sleep disorder that makes it difficult to fall asleep or stay asleep. Insomnia can cause fatigue, low energy, difficulty concentrating, mood swings, and poor performance at work or school. There are three different ways to classify insomnia:  Difficulty falling asleep.  Difficulty staying asleep.  Waking up too early in the morning. Any type of insomnia can be long-term (chronic) or short-term (acute). Both are common. Short-term insomnia usually lasts for three months or less. Chronic insomnia occurs at least three times a week for longer than three months. What are the causes? Insomnia may be caused by another condition, situation, or substance, such as:  Anxiety.  Certain medicines.  Gastroesophageal reflux disease (GERD) or other gastrointestinal conditions.  Asthma or other breathing conditions.  Restless legs syndrome, sleep apnea, or other sleep disorders.  Chronic pain.  Menopause.  Stroke.  Abuse of alcohol, tobacco, or illegal drugs.  Mental health conditions, such as depression.  Caffeine.  Neurological disorders, such as Alzheimer's disease.  An overactive thyroid (hyperthyroidism). Sometimes, the cause of insomnia may not be known. What increases the risk? Risk factors for  insomnia include:  Gender. Women are affected more often than men.  Age. Insomnia is more common as you get older.  Stress.  Lack of exercise.  Irregular work schedule or working night shifts.  Traveling between different time zones.  Certain medical and mental health conditions. What are the signs or symptoms? If you have insomnia, the main symptom is having trouble falling asleep or having trouble staying asleep. This may lead to other symptoms, such as:  Feeling fatigued or having low energy.  Feeling nervous about going to sleep.  Not feeling rested in the morning.  Having trouble concentrating.  Feeling irritable, anxious, or depressed. How is this diagnosed? This condition may be diagnosed based on:  Your symptoms and medical history. Your health care provider may ask about: ? Your sleep habits. ? Any medical conditions you have. ? Your mental health.  A physical exam. How is this treated? Treatment for insomnia depends on the cause. Treatment may focus on treating an underlying condition that is causing insomnia. Treatment may also include:  Medicines to help you sleep.  Counseling or therapy.  Lifestyle adjustments to help you sleep better. Follow these instructions at home: Eating and drinking  Limit or avoid alcohol, caffeinated beverages, and cigarettes, especially close to bedtime. These can disrupt your sleep.  Do not eat a large meal or eat spicy foods right before bedtime. This can lead to digestive discomfort that can make it hard for you to sleep.   Sleep habits  Keep a sleep diary to help you and your health care provider figure out what could be causing your insomnia. Write down: ? When you sleep. ? When you wake up during the night. ? How well you sleep. ? How rested you feel the next day. ? Any side effects of medicines you are taking. ? What you eat and drink.  Make your bedroom a dark, comfortable place where it is easy to fall  asleep. ? Put up shades or blackout curtains to block light from outside. ? Use a white noise machine to block noise. ? Keep the temperature cool.  Limit screen use before bedtime. This includes: ? Watching TV. ? Using your smartphone, tablet, or computer.  Stick to a routine that includes going to bed and waking up at the same times every day and night. This can help you fall asleep faster. Consider making a quiet activity, such as reading, part of your nighttime routine.  Try to avoid taking naps during the day so that you sleep better at night.  Get out of bed if you are still awake after 15 minutes of trying to sleep. Keep the lights down, but try reading or doing a quiet activity. When you feel sleepy, go back to bed.   General instructions  Take over-the-counter and prescription medicines only as told by your health care provider.  Exercise regularly, as told by your health care provider. Avoid exercise starting several hours before bedtime.  Use relaxation techniques to manage stress. Ask your health care provider to suggest some techniques that may work well for you. These may  include: ? Breathing exercises. ? Routines to release muscle tension. ? Visualizing peaceful scenes.  Make sure that you drive carefully. Avoid driving if you feel very sleepy.  Keep all follow-up visits as told by your health care provider. This is important. Contact a health care provider if:  You are tired throughout the day.  You have trouble in your daily routine due to sleepiness.  You continue to have sleep problems, or your sleep problems get worse. Get help right away if:  You have serious thoughts about hurting yourself or someone else. If you ever feel like you may hurt yourself or others, or have thoughts about taking your own life, get help right away. You can go to your nearest emergency department or call:  Your local emergency services (911 in the [place]  A suicide crisis  helpline, such as the National Suicide Prevention Lifeline at 2346361493. This is open 24 hours a day. Summary  Insomnia is a sleep disorder that makes it difficult to fall asleep or stay asleep.  Insomnia can be long-term (chronic) or short-term (acute).  Treatment for insomnia depends on the cause. Treatment may focus on treating an underlying condition that is causing insomnia.  Keep a sleep diary to help you and your health care provider figure out what could be causing your insomnia. This information is not intended to replace advice given to you by your health care provider. Make sure you discuss any questions you have with your health care provider. Document Revised: 09/02/2020 Document Reviewed: 09/02/2020 Elsevier Patient Education  2021 Elsevier Inc.  Living With Sleep Apnea Sleep apnea is a condition in which breathing pauses or becomes shallow during sleep. Sleep apnea is most commonly caused by a collapsed or blocked airway. People with sleep apnea snore loudly and have times when they gasp and stop breathing for 10 seconds or more during sleep. This happens over and over during the night. This disrupts your sleep and keeps your body from getting the rest that it needs, which can cause tiredness and lack of energy (fatigue) during the day. The breaks in breathing also interrupt the deep sleep that you need to feel rested. Even if you do not completely wake up from the gaps in breathing, your sleep may not be restful. You may also have a headache in the morning and low energy during the day, and you may feel anxious or depressed. How can sleep apnea affect me? Sleep apnea increases your chances of extreme tiredness during the day (daytime fatigue). It can also increase your risk for health conditions, such as:  Heart attack.  Stroke.  Diabetes.  Heart failure.  Irregular heartbeat.  High blood pressure. If you have daytime fatigue as a result of sleep apnea, you may be  more likely to:  Perform poorly at school or work.  Fall asleep while driving.  Have difficulty with attention.  Develop depression or anxiety.  Become severely overweight (obese).  Have sexual dysfunction. What actions can I take to manage sleep apnea? Sleep apnea treatment  If you were given a device to open your airway while you sleep, use it only as told by your health care provider. You may be given: ? An oral appliance. This is a custom-made mouthpiece that shifts your lower jaw forward. ? A continuous positive airway pressure (CPAP) device. This device blows air through a mask when you breathe out (exhale). ? A nasal expiratory positive airway pressure (EPAP) device. This device has valves that you put  into each nostril. ? A bi-level positive airway pressure (BPAP) device. This device blows air through a mask when you breathe in (inhale) and breathe out (exhale).  You may need surgery if other treatments do not work for you.   Sleep habits  Go to sleep and wake up at the same time every day. This helps set your internal clock (circadian rhythm) for sleeping. ? If you stay up later than usual, such as on weekends, try to get up in the morning within 2 hours of your normal wake time.  Try to get at least 7-9 hours of sleep each night.  Stop computer, tablet, and mobile phone use a few hours before bedtime.  Do not take long naps during the day. If you nap, limit it to 30 minutes.  Have a relaxing bedtime routine. Reading or listening to music may relax you and help you sleep.  Use your bedroom only for sleep. ? Keep your television and computer out of your bedroom. ? Keep your bedroom cool, dark, and quiet. ? Use a supportive mattress and pillows.  Follow your health care provider's instructions for other changes to sleep habits. Nutrition  Do not eat heavy meals in the evening.  Do not have caffeine in the later part of the day. The effects of caffeine can last for  more than 5 hours.  Follow your health care provider's or dietitian's instructions for any diet changes. Lifestyle  Do not drink alcohol before bedtime. Alcohol can cause you to fall asleep at first, but then it can cause you to wake up in the middle of the night and have trouble getting back to sleep.  Do not use any products that contain nicotine or tobacco, such as cigarettes and e-cigarettes. If you need help quitting, ask your health care provider.      Medicines  Take over-the-counter and prescription medicines only as told by your health care provider.  Do not use over-the-counter sleep medicine. You can become dependent on this medicine, and it can make sleep apnea worse.  Do not use medicines, such as sedatives and narcotics, unless told by your health care provider. Activity  Exercise on most days, but avoid exercising in the evening. Exercising near bedtime can interfere with sleeping.  If possible, spend time outside every day. Natural light helps regulate your circadian rhythm. General information  Lose weight if you need to, and maintain a healthy weight.  Keep all follow-up visits as told by your health care provider. This is important.  If you are having surgery, make sure to tell your health care provider that you have sleep apnea. You may need to bring your device with you. Where to find more information Learn more about sleep apnea and daytime fatigue from:  American Sleep Association: sleepassociation.org  National Sleep Foundation: sleepfoundation.org  National Heart, Lung, and Blood Institute: [employer] Summary  Sleep apnea can cause daytime fatigue and other serious health conditions.  Both sleep apnea and daytime fatigue can be bad for your health and well-being.  You may need to wear a device while sleeping to help keep your airway open.  If you are having surgery, make sure to tell your health care provider that you have sleep apnea. You may  need to bring your device with you.  Making changes to sleep habits, diet, lifestyle, and activity can help you manage sleep apnea. This information is not intended to replace advice given to you by your health care provider. Make sure you  discuss any questions you have with your health care provider. Document Revised: 02/14/2019 Document Reviewed: 01/17/2018 Elsevier Patient Education  2021 [employer].

## 2021-03-18 ENCOUNTER — Telehealth: Payer: Self-pay | Admitting: Family Medicine

## 2021-03-18 NOTE — Telephone Encounter (Addendum)
Patient is calling and stated that pharmacy will not release Amitiza until provider sends a PA. Pt is requesting a call back please advise. CB is [phone]

## 2021-03-19 ENCOUNTER — Encounter: Payer: Self-pay | Admitting: Family Medicine

## 2021-03-21 ENCOUNTER — Telehealth: Payer: Self-pay | Admitting: Family Medicine

## 2021-03-21 NOTE — Telephone Encounter (Signed)
PA denied - awaiting fax from insurance company.

## 2021-03-21 NOTE — Telephone Encounter (Signed)
Pt states Hydroxine for sleep has not worked for him.  Dr. Salomon Fick prescribed this for him.  He is requesting Ambien to be prescribed for him in its place.  Pt is requesting a call back.   Pharmacy- [facility] [address]

## 2021-03-21 NOTE — Telephone Encounter (Signed)
Submitted via covermymeds. (Key: [id number]

## 2021-03-24 DIAGNOSIS — K5904 Chronic idiopathic constipation: Secondary | ICD-10-CM | POA: Diagnosis not present

## 2021-03-25 ENCOUNTER — Other Ambulatory Visit: Payer: Self-pay | Admitting: Family Medicine

## 2021-03-25 NOTE — Telephone Encounter (Signed)
Noted  

## 2021-03-29 NOTE — Telephone Encounter (Signed)
Medication was approved through GI office.

## 2021-06-15 ENCOUNTER — Telehealth: Payer: Self-pay

## 2021-06-15 DIAGNOSIS — K648 Other hemorrhoids: Secondary | ICD-10-CM | POA: Diagnosis not present

## 2021-06-15 NOTE — Telephone Encounter (Signed)
Patient tested positive for Covid on Monday and has fever blisters would like to know if pt needs an appt or Dr. Salomon Fick will call in Rx

## 2021-06-25 ENCOUNTER — Other Ambulatory Visit: Payer: Self-pay | Admitting: Family Medicine

## 2021-07-03 ENCOUNTER — Ambulatory Visit
Admission: EM | Admit: 2021-07-03 | Discharge: 2021-07-03 | Disposition: A | Discharge status: Home / Self Care | Payer: BC Managed Care – PPO | Attending: Internal Medicine | Admitting: Internal Medicine

## 2021-07-03 ENCOUNTER — Other Ambulatory Visit: Payer: Self-pay

## 2021-07-03 ENCOUNTER — Encounter: Payer: Self-pay | Admitting: Emergency Medicine

## 2021-07-03 DIAGNOSIS — B001 Herpesviral vesicular dermatitis: Secondary | ICD-10-CM | POA: Diagnosis not present

## 2021-07-03 MED ORDER — VALACYCLOVIR HCL 1 G PO TABS: 1000.0000 mg | ORAL_TABLET | Freq: Two times a day (BID) | ORAL | 0 refills | Status: AC

## 2021-07-03 NOTE — ED Provider Notes (Signed)
[facility]    CSN: [id number] Arrival date & time: 07/03/21  1430      History   Chief Complaint Chief Complaint  Patient presents with   Blister    HPI Luke Hunt is a 50 y.o. male.   Patient presents with fever blister to left upper lip. Has tried Abreva OTC with no improvement. Denies any fevers or upper respiratory symptoms. Has history of chronic fever blisters.     Past Medical History:  Diagnosis Date   Chronic constipation    intermittent   Environmental allergies    GERD    Gout    Hyperlipemia    Hypertension    Obesity    OSA (obstructive sleep apnea) 05/23/2012   on CPAP   Syncope    related to blood draws, stitches    Patient Active Problem List   Diagnosis Date Noted   History of COVID-19 03/11/2020   Shortness of breath 02/18/2020   Cough 02/18/2020   Wheeze 02/18/2020   COVID-19 02/18/2020   Prediabetes 08/25/2019   Constipation 03/20/2016   Alcohol abuse 03/20/2016   OSA (obstructive sleep apnea) 05/23/2012   ALLERGIC RHINITIS 01/09/2008   Obesity 07/05/2007   GERD 07/05/2007   Hyperlipemia 07/04/2007   Essential hypertension 07/04/2007    Past Surgical History:  Procedure Laterality Date   APPENDECTOMY     Rigth knee arthroscopy     @ [facility] [facility]   VASECTOMY     10/2017-[facility]-per patient       Home Medications    Prior to Admission medications   Medication Sig Start Date End Date Taking? Authorizing Provider  valACYclovir (VALTREX) 1000 MG tablet Take 1 tablet (1,000 mg total) by mouth 2 (two) times daily for 7 days. 07/03/21 07/10/21 Yes Lance Muss, FNP  hydrOXYzine (VISTARIL) 50 MG capsule Take 1 capsule (50 mg total) by mouth at bedtime as needed. 03/07/21   Deeann Saint, MD  losartan (COZAAR) 50 MG tablet TAKE 1 TABLET BY MOUTH TWICE A DAY 06/27/21   Deeann Saint, MD  lubiprostone (AMITIZA) 8 MCG capsule Take 1 capsule (8 mcg total) by mouth 2 (two) times daily with a meal. 03/07/21    Deeann Saint, MD  Omega-3 Fatty Acids (FISH OIL PO) Take by mouth.    [provider]  omeprazole (PRILOSEC OTC) 20 MG tablet Take 20 mg by mouth as needed.     [provider]    Family History Family History  Problem Relation Age of Onset   Stroke Maternal Grandmother    Diabetes Mother    Breast cancer Mother    Heart disease Maternal Grandfather    Diabetes Maternal Grandfather    Cystic fibrosis Maternal Aunt    Diabetes Brother     Social History Social History   Tobacco Use   Smoking status: Former    Years: 6.00    Types: Cigarettes    Quit date: 02/15/2006    Years since quitting: 15.3   Smokeless tobacco: Never  Substance Use Topics   Alcohol use: Yes    Alcohol/week: 14.0 standard drinks    Types: 14 Standard drinks or equivalent per week    Comment: 2 per day   Drug use: No     Allergies   Patient has no known allergies.   Review of Systems Review of Systems Per HPI  Physical Exam Triage Vital Signs ED Triage Vitals [07/03/21 1530]  Enc Vitals Group  BP 124/77     Pulse Rate 87     Resp 16     Temp 97.8 F (36.6 C)     Temp Source Oral     SpO2 95 %     Weight      Height      Head Circumference      Peak Flow      Pain Score 0     Pain Loc      Pain Edu?      Excl. in GC?    No data found.  Updated Vital Signs BP 124/77 (BP Location: Left Arm)   Pulse 87   Temp 97.8 F (36.6 C) (Oral)   Resp 16   SpO2 95%   Visual Acuity Right Eye Distance:   Left Eye Distance:   Bilateral Distance:    Right Eye Near:   Left Eye Near:    Bilateral Near:     Physical Exam Constitutional:      Appearance: Normal appearance.  HENT:     Head: Normocephalic and atraumatic.     Mouth/Throat:     Lips: Lesions present.     Mouth: Mucous membranes are moist.     Pharynx: Oropharynx is clear. No posterior oropharyngeal erythema.     Comments: Vesicular raised lesion present to left upper lip Eyes:     Extraocular  Movements: Extraocular movements intact.     Conjunctiva/sclera: Conjunctivae normal.  Pulmonary:     Effort: Pulmonary effort is normal.  Neurological:     General: No focal deficit present.     Mental Status: He is alert and oriented to person, place, and time. Mental status is at baseline.  Psychiatric:        Mood and Affect: Mood normal.        Behavior: Behavior normal.        Thought Content: Thought content normal.        Judgment: Judgment normal.     UC Treatments / Results  Labs (all labs ordered are listed, but only abnormal results are displayed) Labs Reviewed - No data to display  EKG   Radiology No results found.  Procedures Procedures (including critical care time)  Medications Ordered in UC Medications - No data to display  Initial Impression / Assessment and Plan / UC Course  I have reviewed the triage vital signs and the nursing notes.  Pertinent labs & imaging results that were available during my care of the patient were reviewed by me and considered in my medical decision making (see chart for details).     Will treat fever blister with valtrex since it is refractory to OTC medications. Advised patient to follow up if it does not resolve. Discussed strict return precautions. Patient verbalized understanding and is agreeable with plan.  Final Clinical Impressions(s) / UC Diagnoses   Final diagnoses:  Fever blister     Discharge Instructions      Please pick up your antiviral medication to treat fever blister.     ED Prescriptions     Medication Sig Dispense Auth. Provider   valACYclovir (VALTREX) 1000 MG tablet Take 1 tablet (1,000 mg total) by mouth 2 (two) times daily for 7 days. 14 tablet Lance Muss, FNP      PDMP not reviewed this encounter.   Lance Muss, FNP 07/03/21 [phone]

## 2021-07-03 NOTE — ED Triage Notes (Signed)
Fever blister on lip, has taken medication to help stop it if taken early enough, requesting medication

## 2021-07-03 NOTE — Discharge Instructions (Addendum)
Please pick up your antiviral medication to treat fever blister.

## 2021-07-06 DIAGNOSIS — K648 Other hemorrhoids: Secondary | ICD-10-CM | POA: Diagnosis not present

## 2021-07-20 DIAGNOSIS — K648 Other hemorrhoids: Secondary | ICD-10-CM | POA: Diagnosis not present

## 2021-09-26 ENCOUNTER — Other Ambulatory Visit: Payer: Self-pay | Admitting: Family Medicine

## 2021-10-03 ENCOUNTER — Other Ambulatory Visit: Payer: Self-pay

## 2021-10-03 ENCOUNTER — Ambulatory Visit (HOSPITAL_COMMUNITY)
Admission: EM | Admit: 2021-10-03 | Discharge: 2021-10-03 | Disposition: A | Discharge status: Home / Self Care | Payer: BC Managed Care – PPO | Attending: Physician Assistant | Admitting: Physician Assistant

## 2021-10-03 ENCOUNTER — Encounter (HOSPITAL_COMMUNITY): Payer: Self-pay | Admitting: Emergency Medicine

## 2021-10-03 DIAGNOSIS — J329 Chronic sinusitis, unspecified: Secondary | ICD-10-CM | POA: Diagnosis not present

## 2021-10-03 DIAGNOSIS — R051 Acute cough: Secondary | ICD-10-CM | POA: Insufficient documentation

## 2021-10-03 DIAGNOSIS — J40 Bronchitis, not specified as acute or chronic: Secondary | ICD-10-CM | POA: Insufficient documentation

## 2021-10-03 LAB — POCT RAPID STREP A, ED / UC: Streptococcus, Group A Screen (Direct): NEGATIVE

## 2021-10-03 MED ORDER — PREDNISONE 20 MG PO TABS: 40.0000 mg | ORAL_TABLET | Freq: Every day | ORAL | 0 refills | Status: AC

## 2021-10-03 MED ORDER — AMOXICILLIN-POT CLAVULANATE 875-125 MG PO TABS: 1.0000 | ORAL_TABLET | Freq: Two times a day (BID) | ORAL | 0 refills | Status: DC

## 2021-10-03 NOTE — ED Provider Notes (Signed)
[facility]    CSN: [id number] Arrival date & time: 10/03/21  [id number]      History   Chief Complaint Chief Complaint  Patient presents with   Sore Throat   Otalgia   Cough    HPI Luke Hunt is a 50 y.o. male.   Patient presents today with a 4-day history of URI symptoms including cough, congestion, sore throat, otalgia.  Reports that symptoms have gradually been worsening and last night sore throat pain was severe and rated 7 on a 0-10 pain scale, localized Luke posterior oropharynx, described as sharp, no aggravating leaving factors identified.  He has been eating and drinking despite symptoms but reports that he has only been able Luke eat soft foods due Luke pain.  He has tried Motrin without improvement.  Denies any known sick contacts but did recently spend time with family for the holiday.  He has not had COVID-19 vaccine.  Did have COVID-19 in the past with last episode in May 2022.  Denies any recent antibiotic use.   Past Medical History:  Diagnosis Date   Chronic constipation    intermittent   Environmental allergies    GERD    Gout    Hyperlipemia    Hypertension    Obesity    OSA (obstructive sleep apnea) 05/23/2012   on CPAP   Syncope    related Luke blood draws, stitches    Patient Active Problem List   Diagnosis Date Noted   History of COVID-19 03/11/2020   Shortness of breath 02/18/2020   Cough 02/18/2020   Wheeze 02/18/2020   COVID-19 02/18/2020   Prediabetes 08/25/2019   Constipation 03/20/2016   Alcohol abuse 03/20/2016   OSA (obstructive sleep apnea) 05/23/2012   ALLERGIC RHINITIS 01/09/2008   Obesity 07/05/2007   GERD 07/05/2007   Hyperlipemia 07/04/2007   Essential hypertension 07/04/2007    Past Surgical History:  Procedure Laterality Date   APPENDECTOMY     Rigth knee arthroscopy     @ [place]     10/2017-[facility]-per patient       Home Medications    Prior Luke Admission medications   Medication  Sig Start Date End Date Taking? Authorizing Provider  amoxicillin-clavulanate (AUGMENTIN) 875-125 MG tablet Take 1 tablet by mouth every 12 (twelve) hours. 10/03/21  Yes Stefana Lodico K, PA-C  predniSONE (DELTASONE) 20 MG tablet Take 2 tablets (40 mg total) by mouth daily for 5 days. 10/03/21 10/08/21 Yes Junie Engram K, PA-C  hydrOXYzine (VISTARIL) 50 MG capsule Take 1 capsule (50 mg total) by mouth at bedtime as needed. 03/07/21   Billie Ruddy, MD  losartan (COZAAR) 50 MG tablet TAKE 1 TABLET BY MOUTH TWICE A DAY 09/26/21   Billie Ruddy, MD  lubiprostone (AMITIZA) 8 MCG capsule Take 1 capsule (8 mcg total) by mouth 2 (two) times daily with a meal. 03/07/21   Billie Ruddy, MD  Omega-3 Fatty Acids (FISH OIL PO) Take by mouth.    [provider]  omeprazole (PRILOSEC OTC) 20 MG tablet Take 20 mg by mouth as needed.     [provider]    Family History Family History  Problem Relation Age of Onset   Stroke Maternal Grandmother    Diabetes Mother    Breast cancer Mother    Heart disease Maternal Grandfather    Diabetes Maternal Grandfather    Cystic fibrosis Maternal Aunt    Diabetes Brother  Social History Social History   Tobacco Use   Smoking status: Former    Years: 6.00    Types: Cigarettes    Quit date: 02/15/2006    Years since quitting: 15.6   Smokeless tobacco: Never  Substance Use Topics   Alcohol use: Yes    Alcohol/week: 14.0 standard drinks    Types: 14 Standard drinks or equivalent per week    Comment: 2 per day   Drug use: No     Allergies   Patient has no known allergies.   Review of Systems Review of Systems  Constitutional:  Positive for activity change. Negative for appetite change, fatigue and fever.  HENT:  Positive for congestion and sore throat. Negative for sinus pressure and sneezing.   Respiratory:  Positive for cough. Negative for shortness of breath.   Cardiovascular:  Negative for chest pain.  Gastrointestinal:   Negative for abdominal pain, diarrhea, nausea and vomiting.  Musculoskeletal:  Negative for arthralgias and myalgias.  Neurological:  Negative for dizziness, light-headedness and headaches.    Physical Exam Triage Vital Signs ED Triage Vitals  Enc Vitals Group     BP 10/03/21 1111 129/80     Pulse Rate 10/03/21 1111 65     Resp 10/03/21 1111 17     Temp 10/03/21 1111 98.1 F (36.7 C)     Temp Source 10/03/21 1111 Oral     SpO2 10/03/21 1111 97 %     Weight --      Height --      Head Circumference --      Peak Flow --      Pain Score 10/03/21 1110 7     Pain Loc --      Pain Edu? --      Excl. in GC? --    No data found.  Updated Vital Signs BP 129/80 (BP Location: Right Arm)   Pulse 65   Temp 98.1 F (36.7 C) (Oral)   Resp 17   SpO2 97%   Visual Acuity Right Eye Distance:   Left Eye Distance:   Bilateral Distance:    Right Eye Near:   Left Eye Near:    Bilateral Near:     Physical Exam Vitals reviewed.  Constitutional:      General: He is awake.     Appearance: Normal appearance. He is well-developed. He is not ill-appearing.     Comments: Very pleasant male appears stated age in no acute distress sitting comfortably in exam room  HENT:     Head: Normocephalic and atraumatic.     Right Ear: Ear canal and external ear normal. A middle ear effusion is present. Tympanic membrane is not erythematous or bulging.     Left Ear: Ear canal and external ear normal. A middle ear effusion is present. Tympanic membrane is not erythematous or bulging.     Nose:     Right Sinus: Maxillary sinus tenderness present. No frontal sinus tenderness.     Left Sinus: Maxillary sinus tenderness present. No frontal sinus tenderness.     Mouth/Throat:     Pharynx: Uvula midline. Posterior oropharyngeal erythema present. No oropharyngeal exudate or uvula swelling.     Tonsils: No tonsillar exudate or tonsillar abscesses.  Cardiovascular:     Rate and Rhythm: Normal rate and regular  rhythm.     Heart sounds: Normal heart sounds, S1 normal and S2 normal. No murmur heard. Pulmonary:     Effort: Pulmonary effort is normal. No  accessory muscle usage or respiratory distress.     Breath sounds: Normal breath sounds. No stridor. No wheezing, rhonchi or rales.     Comments: Clear Luke auscultation bilaterally Abdominal:     General: Bowel sounds are normal.     Palpations: Abdomen is soft.     Tenderness: There is no abdominal tenderness.  Neurological:     Mental Status: He is alert.  Psychiatric:        Behavior: Behavior is cooperative.     UC Treatments / Results  Labs (all labs ordered are listed, but only abnormal results are displayed) Labs Reviewed  CULTURE, GROUP A STREP [place])  POCT RAPID STREP A, ED / UC    EKG   Radiology No results found.  Procedures Procedures (including critical care time)  Medications Ordered in UC Medications - No data Luke display  Initial Impression / Assessment and Plan / UC Course  Luke have reviewed the triage vital signs and the nursing notes.  Pertinent labs & imaging results that were available during my care of the patient were reviewed by me and considered in my medical decision making (see chart for details).     Rapid strep was negative in clinic today.  Throat culture pending.  No indication for viral testing as patient has been symptomatic for 4 Luke 5 days and this would not change management.  He was instructed Luke remain in isolation until symptoms improve without medication use.  Discussed that there is no indication Luke start antibiotics but he is concerned as he often gets sinus infections and current symptoms are similar Luke previous episodes particular given recent worsening in the past 24 hours.  We will start prednisone burst but if symptoms or not improving within 48-72 hours he can start Augmentin was given prescription Luke keep on hand.  Discussed that he is not Luke start this medication for several days and only  if symptoms or not improving Luke which she expressed understanding.  Discussed that he is not Luke take NSAIDs with prednisone due Luke risk of GI bleeding.  He can use Tylenol, Mucinex, Flonase for additional symptom relief.  Recommended he rest and drink plenty of fluid.  Discussed alarm symptoms that warrant emergent evaluation.  Strict return precautions given Luke which he expressed understanding.  Final Clinical Impressions(s) / UC Diagnoses   Final diagnoses:  Sinobronchitis  Acute cough     Discharge Instructions      Your strep was negative.  Luke believe that you have a sinobronchitis infection.  Please start prednisone as prescribed.  Do not take NSAIDs including aspirin, ibuprofen/Advil, naproxen/Aleve with this medication as it can cause stomach bleeding.  You can use Tylenol, Mucinex, Flonase for symptom relief.  You do not needs antibiotics at this point but if your symptoms are not improving with steroids within a few days you can consider starting Augmentin as we discussed.  Please wait a few days before starting this medication.  If you have any worsening symptoms please return for reevaluation.     ED Prescriptions     Medication Sig Dispense Auth. Provider   amoxicillin-clavulanate (AUGMENTIN) 875-125 MG tablet Take 1 tablet by mouth every 12 (twelve) hours. 14 tablet [place] Athanas K, PA-C   predniSONE (DELTASONE) 20 MG tablet Take 2 tablets (40 mg total) by mouth daily for 5 days. 10 tablet Lorelee Mclaurin, Derry Skill, PA-C      PDMP not reviewed this encounter.   Terrilee Croak, PA-C 10/03/21 1152

## 2021-10-03 NOTE — Discharge Instructions (Signed)
Your strep was negative.  I believe that you have a sinobronchitis infection.  Please start prednisone as prescribed.  Do not take NSAIDs including aspirin, ibuprofen/Advil, naproxen/Aleve with this medication as it can cause stomach bleeding.  You can use Tylenol, Mucinex, Flonase for symptom relief.  You do not needs antibiotics at this point but if your symptoms are not improving with steroids within a few days you can consider starting Augmentin as we discussed.  Please wait a few days before starting this medication.  If you have any worsening symptoms please return for reevaluation.

## 2021-10-03 NOTE — ED Triage Notes (Signed)
Pt c/o cough, congestion, sore throat and ear pain mainly on left x 4 days.

## 2021-10-04 ENCOUNTER — Telehealth: Payer: BC Managed Care – PPO | Admitting: Nurse Practitioner

## 2021-10-05 LAB — CULTURE, GROUP A STREP (THRC)

## 2022-01-14 ENCOUNTER — Other Ambulatory Visit: Payer: Self-pay | Admitting: Family Medicine

## 2022-02-20 ENCOUNTER — Other Ambulatory Visit: Payer: Self-pay | Admitting: Family Medicine

## 2022-05-04 ENCOUNTER — Encounter: Payer: Self-pay | Admitting: Emergency Medicine

## 2022-05-04 ENCOUNTER — Ambulatory Visit
Admission: EM | Admit: 2022-05-04 | Discharge: 2022-05-04 | Disposition: A | Discharge status: Home / Self Care | Payer: No Typology Code available for payment source

## 2022-05-04 DIAGNOSIS — M79645 Pain in left finger(s): Secondary | ICD-10-CM

## 2022-05-04 DIAGNOSIS — S61012A Laceration without foreign body of left thumb without damage to nail, initial encounter: Secondary | ICD-10-CM

## 2022-05-04 NOTE — ED Triage Notes (Addendum)
Pt here with laceration to left thumb from knife last night at 2030. Pt used steri strips and tube gauze to close and is still slightly bleeding today. Pt is up to date of Tetanus (2020)

## 2022-05-04 NOTE — ED Provider Notes (Signed)
[name]   MRN: [id number] DOB: 09-29-1971  Subjective:   Luke Hunt is a 51 y.o. male presenting for suffering a left thumb laceration last night.  Tdap is up-to-date.  He did clean his wound thoroughly and applied over-the-counter wound glue, Steri-Strips.  No current facility-administered medications for this encounter.  Current Outpatient Medications:    hydrOXYzine (VISTARIL) 50 MG capsule, Take 1 capsule (50 mg total) by mouth at bedtime as needed., Disp: 30 capsule, Rfl: 0   losartan (COZAAR) 50 MG tablet, TAKE 1 TABLET BY MOUTH TWICE A DAY, Disp: 60 tablet, Rfl: 2   lubiprostone (AMITIZA) 8 MCG capsule, Take 1 capsule (8 mcg total) by mouth 2 (two) times daily with a meal., Disp: 60 capsule, Rfl: 1   Omega-3 Fatty Acids (FISH OIL PO), Take by mouth., Disp: , Rfl:    omeprazole (PRILOSEC OTC) 20 MG tablet, Take 20 mg by mouth as needed. , Disp: , Rfl:    No Known Allergies  Past Medical History:  Diagnosis Date   Chronic constipation    intermittent   Environmental allergies    GERD    Gout    Hyperlipemia    Hypertension    Obesity    OSA (obstructive sleep apnea) 05/23/2012   on CPAP   Syncope    related to blood draws, stitches     Past Surgical History:  Procedure Laterality Date   APPENDECTOMY     Rigth knee arthroscopy     @ [facility]   VASECTOMY     10/2017-[facility]-per patient    Family History  Problem Relation Age of Onset   Stroke Maternal Grandmother    Diabetes Mother    Breast cancer Mother    Heart disease Maternal Grandfather    Diabetes Maternal Grandfather    Cystic fibrosis Maternal Aunt    Diabetes Brother     Social History   Tobacco Use   Smoking status: Former    Years: 6.00    Types: Cigarettes    Quit date: 02/15/2006    Years since quitting: 16.2   Smokeless tobacco: Never  Substance Use Topics   Alcohol use: Yes    Alcohol/week: 14.0 standard drinks of alcohol    Types: 14  Standard drinks or equivalent per week    Comment: 2 per day   Drug use: No    ROS   Objective:   Vitals: BP 136/85   Pulse 81   Temp 98 F (36.7 C)   Resp 20   SpO2 98%   Physical Exam Constitutional:      General: He is not in acute distress.    Appearance: Normal appearance. He is well-developed and normal weight. He is not ill-appearing, toxic-appearing or diaphoretic.  HENT:     Head: Normocephalic and atraumatic.     Right Ear: External ear normal.     Left Ear: External ear normal.     Nose: Nose normal.     Mouth/Throat:     Pharynx: Oropharynx is clear.  Eyes:     General: No scleral icterus.       Right eye: No discharge.        Left eye: No discharge.     Extraocular Movements: Extraocular movements intact.  Cardiovascular:     Rate and Rhythm: Normal rate.  Pulmonary:     Effort: Pulmonary effort is normal.  Musculoskeletal:       Hands:  Cervical back: Normal range of motion.  Neurological:     Mental Status: He is alert and oriented to person, place, and time.  Psychiatric:        Mood and Affect: Mood normal.        Behavior: Behavior normal.        Thought Content: Thought content normal.        Judgment: Judgment normal.    Full range of motion prior to and after laceration repair.  PROCEDURE NOTE: laceration repair Verbal consent obtained from patient.  Local anesthesia with 3cc Lidocaine 1% without epinephrine.  Wound explored for tendon, ligament damage. Wound scrubbed with soap and water and rinsed. Wound closed with #4 5-0 Prolene (simple interrupted) sutures.  Wound cleansed and dressed.   Assessment and Plan :   PDMP not reviewed this encounter.  1. Pain of left thumb   2. Laceration of left thumb without foreign body without damage to nail, initial encounter    Laceration repaired successfully. Wound care reviewed. Recommended Tylenol and/or ibuprofen for pain control. Return-to-clinic precautions discussed, patient  verbalized understanding. Otherwise, follow up in 7 days for suture removal. Counseled patient on potential for adverse effects with medications prescribed/recommended today, ER and return-to-clinic precautions discussed, patient verbalized understanding.    Wallis Bamberg, PA-C 05/04/22 1005

## 2022-05-04 NOTE — Discharge Instructions (Addendum)
WOUND CARE Please return in 7 days to have your stitches/staples removed or sooner if you have concerns.  Keep area clean and dry for 24 hours. Do not remove bandage, if applied.  After 24 hours, remove bandage and wash wound gently with mild soap and warm water. Reapply a new bandage after cleaning wound, if directed.  Continue daily cleansing with soap and water until stitches/staples are removed.  Do not apply any ointments or creams to the wound while stitches/staples are in place, as this may cause delayed healing.  Notify the office if you experience any of the following signs of infection: Swelling, redness, pus drainage, streaking, fever >101.0 F  Notify the office if you experience excessive bleeding that does not stop after 15-20 minutes of constant, firm pressure.  Use Tylenol and/or ibuprofen for pain relief.

## 2022-05-15 ENCOUNTER — Ambulatory Visit
Admission: EM | Admit: 2022-05-15 | Discharge: 2022-05-15 | Disposition: A | Discharge status: Home / Self Care | Payer: No Typology Code available for payment source

## 2022-05-15 NOTE — ED Triage Notes (Signed)
Patient needing sutures removed from left thumb, the patient now has 3 sutures visible and states he removed one on his own. The patient states he has not noticed any s/s of infection to the area.

## 2022-05-15 NOTE — ED Notes (Signed)
3 sutures successfully removed, skin intact.

## 2022-05-15 NOTE — Discharge Instructions (Signed)
Please monitor for signs of infection (drainage, odor, redness, and swelling) or any opening/s of the wound. If you notice any of these symptoms or have complications to the area please return for re-evaluation.  

## 2022-05-29 ENCOUNTER — Other Ambulatory Visit: Payer: Self-pay | Admitting: Family Medicine

## 2022-06-02 ENCOUNTER — Ambulatory Visit
Admission: EM | Admit: 2022-06-02 | Discharge: 2022-06-02 | Disposition: A | Discharge status: Home / Self Care | Payer: No Typology Code available for payment source | Attending: Emergency Medicine | Admitting: Emergency Medicine

## 2022-06-02 DIAGNOSIS — J309 Allergic rhinitis, unspecified: Secondary | ICD-10-CM

## 2022-06-02 DIAGNOSIS — J019 Acute sinusitis, unspecified: Secondary | ICD-10-CM

## 2022-06-02 DIAGNOSIS — J329 Chronic sinusitis, unspecified: Secondary | ICD-10-CM

## 2022-06-02 DIAGNOSIS — J310 Chronic rhinitis: Secondary | ICD-10-CM

## 2022-06-02 DIAGNOSIS — B9689 Other specified bacterial agents as the cause of diseases classified elsewhere: Secondary | ICD-10-CM

## 2022-06-02 DIAGNOSIS — J3089 Other allergic rhinitis: Secondary | ICD-10-CM

## 2022-06-02 DIAGNOSIS — J0140 Acute pansinusitis, unspecified: Secondary | ICD-10-CM

## 2022-06-02 MED ORDER — FLUTICASONE PROPIONATE 50 MCG/ACT NA SUSP
1.0000 | Freq: Every day | NASAL | 2 refills | Status: DC
Start: 2022-06-02 — End: 2022-09-15

## 2022-06-02 MED ORDER — AMOXICILLIN-POT CLAVULANATE 875-125 MG PO TABS
1.0000 | ORAL_TABLET | Freq: Two times a day (BID) | ORAL | 0 refills | Status: AC
Start: 2022-06-02 — End: 2022-06-09

## 2022-06-02 MED ORDER — FEXOFENADINE HCL 180 MG PO TABS
180.0000 mg | ORAL_TABLET | Freq: Every day | ORAL | 1 refills | Status: DC
Start: 2022-06-02 — End: 2022-09-15

## 2022-06-02 MED ORDER — IPRATROPIUM BROMIDE 0.06 % NA SOLN
2.0000 | Freq: Three times a day (TID) | NASAL | 2 refills | Status: DC
Start: 2022-06-02 — End: 2022-09-15

## 2022-06-02 NOTE — ED Provider Notes (Signed)
[facility]    CSN: [id number] Arrival date & time: 06/02/22  0846    HISTORY   Chief Complaint  Patient presents with   Nasal Congestion   Facial Pain   HPI Luke Hunt is a pleasant, 51 y.o. male who presents to urgent care today. Patient complains of pressure in his face above and below his eyes on both sides, nasal congestion for the past 5 days, feels Hunt he is starting to develop some congestion in his chest now which is what brought him in to be seen.  Patient states he has been using Vicks nasal spray (oxymetazoline) and Zyrtec-D without meaningful relief of his symptoms.  Vital signs are reasonably normal on arrival today with the exception of his elevated blood pressure.  The history is provided by the patient.   Past Medical History:  Diagnosis Date   Chronic constipation    intermittent   Environmental allergies    GERD    Gout    Hyperlipemia    Hypertension    Obesity    OSA (obstructive sleep apnea) 05/23/2012   on CPAP   Syncope    related to blood draws, stitches   Patient Active Problem List   Diagnosis Date Noted   History of COVID-19 03/11/2020   Shortness of breath 02/18/2020   Cough 02/18/2020   Wheeze 02/18/2020   COVID-19 02/18/2020   Prediabetes 08/25/2019   Constipation 03/20/2016   Alcohol abuse 03/20/2016   OSA (obstructive sleep apnea) 05/23/2012   ALLERGIC RHINITIS 01/09/2008   Obesity 07/05/2007   GERD 07/05/2007   Hyperlipemia 07/04/2007   Essential hypertension 07/04/2007   Past Surgical History:  Procedure Laterality Date   APPENDECTOMY     Rigth knee arthroscopy     @ [place]     10/2017-[facility]-per patient    Home Medications    Prior to Admission medications   Medication Sig Start Date End Date Taking? Authorizing Provider  losartan (COZAAR) 50 MG tablet TAKE 1 TABLET BY MOUTH TWICE A DAY 02/21/22   Billie Ruddy, MD  Omega-3 Fatty Acids (FISH OIL PO) Take by mouth.     [provider]  omeprazole (PRILOSEC OTC) 20 MG tablet Take 20 mg by mouth as needed.     [provider]    Family History Family History  Problem Relation Age of Onset   Stroke Maternal Grandmother    Diabetes Mother    Breast cancer Mother    Heart disease Maternal Grandfather    Diabetes Maternal Grandfather    Cystic fibrosis Maternal Aunt    Diabetes Brother    Social History Social History   Tobacco Use   Smoking status: Former    Years: 6.00    Types: Cigarettes    Quit date: 02/15/2006    Years since quitting: 16.3   Smokeless tobacco: Never  Substance Use Topics   Alcohol use: Yes    Alcohol/week: 14.0 standard drinks of alcohol    Types: 14 Standard drinks or equivalent per week    Comment: 2 per day   Drug use: No   Allergies   Patient has no known allergies.  Review of Systems Review of Systems Pertinent findings revealed after performing a 14 point review of systems has been noted in the history of present illness.  Physical Exam Triage Vital Signs ED Triage Vitals  Enc Vitals Group     BP 09/02/21 0827 (!) 147/82  Pulse Rate 09/02/21 0827 72     Resp 09/02/21 0827 18     Temp 09/02/21 0827 98.3 F (36.8 C)     Temp Source 09/02/21 0827 Oral     SpO2 09/02/21 0827 98 %     Weight --      Height --      Head Circumference --      Peak Flow --      Pain Score 09/02/21 0826 5     Pain Loc --      Pain Edu? --      Excl. in GC? --   No data found.  Updated Vital Signs BP (!) 159/82 (BP Location: Left Arm)   Pulse 84   Temp 98.1 F (36.7 C) (Oral)   Resp 18   SpO2 95%   Physical Exam Vitals and nursing note reviewed.  Constitutional:      General: He is not in acute distress.    Appearance: Normal appearance. He is not ill-appearing.  HENT:     Head: Normocephalic and atraumatic.     Salivary Glands: Right salivary gland is not diffusely enlarged or tender. Left salivary gland is not diffusely enlarged or tender.      Right Ear: Hearing, ear canal and external ear normal. No drainage. No middle ear effusion. There is no impacted cerumen. Tympanic membrane is bulging (Suppurative fluid). Tympanic membrane is not injected or erythematous.     Left Ear: Hearing, ear canal and external ear normal. No drainage.  No middle ear effusion. There is no impacted cerumen. Tympanic membrane is bulging (Clear fluid). Tympanic membrane is not injected or erythematous.     Ears:     Comments: Bilateral EACs normal    Nose: Rhinorrhea present. No nasal deformity, septal deviation, signs of injury, nasal tenderness, mucosal edema or congestion. Rhinorrhea is clear.     Right Nostril: Occlusion present. No foreign body, epistaxis or septal hematoma.     Left Nostril: Occlusion present. No foreign body, epistaxis or septal hematoma.     Right Turbinates: Enlarged, swollen and pale.     Left Turbinates: Enlarged, swollen and pale.     Right Sinus: Maxillary sinus tenderness and frontal sinus tenderness present.     Left Sinus: Maxillary sinus tenderness and frontal sinus tenderness present.     Mouth/Throat:     Lips: Pink. No lesions.     Mouth: Mucous membranes are moist. No oral lesions.     Pharynx: Oropharynx is clear. Uvula midline. No posterior oropharyngeal erythema or uvula swelling.     Tonsils: No tonsillar exudate. 0 on the right. 0 on the left.     Comments: Postnasal drip Eyes:     General: Lids are normal.        Right eye: No discharge.        Left eye: No discharge.     Extraocular Movements: Extraocular movements intact.     Conjunctiva/sclera: Conjunctivae normal.     Right eye: Right conjunctiva is not injected.     Left eye: Left conjunctiva is not injected.  Neck:     Trachea: Trachea and phonation normal.  Cardiovascular:     Rate and Rhythm: Normal rate and regular rhythm.     Pulses: Normal pulses.     Heart sounds: Normal heart sounds. No murmur heard.    No friction rub. No gallop.   Pulmonary:     Effort: Pulmonary effort is normal. No accessory muscle usage,  prolonged expiration or respiratory distress.     Breath sounds: Normal breath sounds. No stridor, decreased air movement or transmitted upper airway sounds. No decreased breath sounds, wheezing, rhonchi or rales.  Chest:     Chest wall: No tenderness.  Musculoskeletal:        General: Normal range of motion.     Cervical back: Normal range of motion and neck supple. Normal range of motion.  Lymphadenopathy:     Cervical: Cervical adenopathy present.     Right cervical: Superficial cervical adenopathy present.     Left cervical: Superficial cervical adenopathy present.  Skin:    General: Skin is warm and dry.     Findings: No erythema or rash.  Neurological:     General: No focal deficit present.     Mental Status: He is alert and oriented to person, place, and time.  Psychiatric:        Mood and Affect: Mood normal.        Behavior: Behavior normal.     Visual Acuity Right Eye Distance:   Left Eye Distance:   Bilateral Distance:    Right Eye Near:   Left Eye Near:    Bilateral Near:     UC Couse / Diagnostics / Procedures:     Radiology No results found.  Procedures Procedures (including critical care time) EKG  Pending results:  Labs Reviewed - No data to display  Medications Ordered in UC: Medications - No data to display  UC Diagnoses / Final Clinical Impressions(s)   I have reviewed the triage vital signs and the nursing notes.  Pertinent labs & imaging results that were available during my care of the patient were reviewed by me and considered in my medical decision making (see chart for details).    Final diagnoses:  Environmental and seasonal allergies  Allergic rhinitis, unspecified seasonality, unspecified trigger  Rhinosinusitis  Acute pansinusitis, recurrence not specified  Acute bacterial sinusitis   Patient likely suffering from a viral sinus infection secondary  to uncontrolled seasonal environmental allergies.  Given duration of symptoms, it is likely patient is developed a bacterial superinfection at this point.  Patient advised to discontinue OTC decongestants, begin Allegra, Atrovent, and Flonase for findings concerning for chronic allergic rhinitis.  Patient provided with 7-day course of Augmentin for presumed bacterial sinus infection.  Return precautions advised.  ED Prescriptions     Medication Sig Dispense Auth. Provider   amoxicillin-clavulanate (AUGMENTIN) 875-125 MG tablet Take 1 tablet by mouth 2 (two) times daily for 7 days. 14 tablet Theadora Rama Scales, PA-C   fexofenadine (ALLEGRA) 180 MG tablet Take 1 tablet (180 mg total) by mouth daily. 90 tablet Theadora Rama Scales, PA-C   ipratropium (ATROVENT) 0.06 % nasal spray Place 2 sprays into both nostrils 3 (three) times daily. As needed for nasal congestion, runny nose 15 mL Theadora Rama Scales, PA-C   fluticasone (FLONASE) 50 MCG/ACT nasal spray Place 1 spray into both nostrils daily. Begin by using 2 sprays in each nare daily for 3 to 5 days, then decrease to 1 spray in each nare daily. 15.8 mL Theadora Rama Scales, PA-C      PDMP not reviewed this encounter.  Disposition Upon Discharge:  Condition: stable for discharge home Home: take medications as prescribed; routine discharge instructions as discussed; follow up as advised.  Patient presented with an acute illness with associated systemic symptoms and significant discomfort requiring urgent management. In my opinion, this is a condition that a prudent  lay person (someone who possesses an average knowledge of health and medicine) may potentially expect to result in complications if not addressed urgently such as respiratory distress, impairment of bodily function or dysfunction of bodily organs.   Routine symptom specific, illness specific and/or disease specific instructions were discussed with the patient and/or caregiver  at length.   As such, the patient has been evaluated and assessed, work-up was performed and treatment was provided in alignment with urgent care protocols and evidence based medicine.  Patient/parent/caregiver has been advised that the patient may require follow up for further testing and treatment if the symptoms continue in spite of treatment, as clinically indicated and appropriate.  If the patient was tested for COVID-19, Influenza and/or RSV, then the patient/parent/guardian was advised to isolate at home pending the results of his/her diagnostic coronavirus test and potentially longer if they're positive. I have also advised pt that if his/her COVID-19 test returns positive, it's recommended to self-isolate for at least 10 days after symptoms first appeared AND until fever-free for 24 hours without fever reducer AND other symptoms have improved or resolved. Discussed self-isolation recommendations as well as instructions for household member/close contacts as per the [facility] and [place] DHHS, and also gave patient the [facility] packet with this information.  Patient/parent/caregiver has been advised to return to the [facility] or PCP in 3-5 days if no better; to PCP or the Emergency Department if new signs and symptoms develop, or if the current signs or symptoms continue to change or worsen for further workup, evaluation and treatment as clinically indicated and appropriate  The patient will follow up with their current PCP if and as advised. If the patient does not currently have a PCP we will assist them in obtaining one.   The patient may need specialty follow up if the symptoms continue, in spite of conservative treatment and management, for further workup, evaluation, consultation and treatment as clinically indicated and appropriate.  Patient/parent/caregiver verbalized understanding and agreement of plan as discussed.  All questions were addressed during visit.  Please see discharge instructions below for  further details of plan.  Discharge Instructions:   Discharge Instructions      Your symptoms and physical exam findings are concerning for a viral respiratory infection.  Because you are respiratory allergies are not well controlled at this time, this makes you more susceptible to catching respiratory infections.  To avoid catching frequent respiratory infections, having skin reactions, dealing with eye irritation, losing sleep, missing work, etc., due to uncontrolled allergies, it is important that you begin a reliable allergy regimen and are consistent with taking your meds exactly as prescribed.   Please see the list below for recommended medications, dosages and frequencies to provide relief of your current symptoms:    Augmentin (amoxicillin - clavulanic acid):  take 1 tablet twice daily for 10 days, you can take it with or without food.  This antibiotic can cause upset stomach, this will resolve once antibiotics are complete.  You are welcome to use a probiotic, eat yogurt, take Imodium while taking this medication.  Please avoid other systemic medications such as Maalox, Pepto-Bismol or milk of magnesia as they can interfere with your body's ability to absorb the antibiotics.   Allegra (fexofenadine): This is an excellent second-generation antihistamine that helps to reduce respiratory inflammatory response to environmental allergens.  This medication is not known to cause daytime sleepiness so it can be taken in the daytime.  If you find that it does make you  sleepy, please feel free to take it at bedtime.   Flonase (fluticasone): This is a steroid nasal spray that you use once daily, 1 spray in each nare.  This medication does not work well if you decide to use it only used as you feel you need to, it works best used on a daily basis.  After 3 to 5 days of use, you will notice significant reduction of the inflammation and mucus production that is currently being caused by exposure to  allergens, whether seasonal or environmental.  The most common side effect of this medication is nosebleeds.  If you experience a nosebleed, please discontinue use for 1 week, then feel free to resume.  I have provided you with a prescription.     Atrovent (ipratropium): This is an excellent nasal decongestant spray I have added to your recommended nasal steroid that will not cause rebound congestion, please instill 2 sprays into each nare with each use.  Because nasal steroids can take several days before they begin to provide full benefit, I recommend that you use this spray in addition to the nasal steroid prescribed for you.  Please use it after you have used your nasal steroid and repeat up to 4 times daily as needed.  I have provided you with a prescription for this medication.      Advil, Motrin (ibuprofen): This is a good anti-inflammatory medication which not only addresses aches, pains but also significantly reduces soft tissue inflammation of the upper airways that causes sinus and nasal congestion as well as inflammation of the lower airways which makes you feel Hunt your breathing is constricted or your cough feel tight.  I recommend that you take between 400 mg every 8 hours as needed.      If your insurance will not cover your allergy medications, please consider downloading the Good Rx app which is free.  You can find considerable discounts on prescription and over-the-counter medications.   Please follow-up within the next 5-7 days either with your primary care provider or urgent care if your symptoms do not resolve.  If you do not have a primary care provider, we will assist you in finding one.   Thank you for visiting urgent care today.  We appreciate the opportunity to participate in your care.       This office note has been dictated using Teaching laboratory technician.  Unfortunately, this method of dictation can sometimes lead to typographical or grammatical errors.  I  apologize for your inconvenience in advance if this occurs.  Please do not hesitate to reach out to me if clarification is needed.      Theadora Rama Scales, [place] 06/02/22 [phone]

## 2022-06-02 NOTE — Discharge Instructions (Signed)
Your symptoms and physical exam findings are concerning for a viral respiratory infection.  Because you are respiratory allergies are not well controlled at this time, this makes you more susceptible to catching respiratory infections.  To avoid catching frequent respiratory infections, having skin reactions, dealing with eye irritation, losing sleep, missing work, etc., due to uncontrolled allergies, it is important that you begin a reliable allergy regimen and are consistent with taking your meds exactly as prescribed.   Please see the list below for recommended medications, dosages and frequencies to provide relief of your current symptoms:    Augmentin (amoxicillin - clavulanic acid):  take 1 tablet twice daily for 10 days, you can take it with or without food.  This antibiotic can cause upset stomach, this will resolve once antibiotics are complete.  You are welcome to use a probiotic, eat yogurt, take Imodium while taking this medication.  Please avoid other systemic medications such as Maalox, Pepto-Bismol or milk of magnesia as they can interfere with your body's ability to absorb the antibiotics.   Allegra (fexofenadine): This is an excellent second-generation antihistamine that helps to reduce respiratory inflammatory response to environmental allergens.  This medication is not known to cause daytime sleepiness so it can be taken in the daytime.  If you find that it does make you sleepy, please feel free to take it at bedtime.   Flonase (fluticasone): This is a steroid nasal spray that you use once daily, 1 spray in each nare.  This medication does not work well if you decide to use it only used as you feel you need to, it works best used on a daily basis.  After 3 to 5 days of use, you will notice significant reduction of the inflammation and mucus production that is currently being caused by exposure to allergens, whether seasonal or environmental.  The most common side effect of this  medication is nosebleeds.  If you experience a nosebleed, please discontinue use for 1 week, then feel free to resume.  I have provided you with a prescription.     Atrovent (ipratropium): This is an excellent nasal decongestant spray I have added to your recommended nasal steroid that will not cause rebound congestion, please instill 2 sprays into each nare with each use.  Because nasal steroids can take several days before they begin to provide full benefit, I recommend that you use this spray in addition to the nasal steroid prescribed for you.  Please use it after you have used your nasal steroid and repeat up to 4 times daily as needed.  I have provided you with a prescription for this medication.      Advil, Motrin (ibuprofen): This is a good anti-inflammatory medication which not only addresses aches, pains but also significantly reduces soft tissue inflammation of the upper airways that causes sinus and nasal congestion as well as inflammation of the lower airways which makes you feel like your breathing is constricted or your cough feel tight.  I recommend that you take between 400 mg every 8 hours as needed.      If your insurance will not cover your allergy medications, please consider downloading the Good Rx app which is free.  You can find considerable discounts on prescription and over-the-counter medications.   Please follow-up within the next 5-7 days either with your primary care provider or urgent care if your symptoms do not resolve.  If you do not have a primary care provider, we will assist you in finding  one.   Thank you for visiting urgent care today.  We appreciate the opportunity to participate in your care.

## 2022-06-02 NOTE — ED Triage Notes (Signed)
Pt c/o facial pressure, nasal congestion and is now having some chest congestion.   Started: last Sunday   Home interventions: vics nasal spray, zyrtec d

## 2022-06-05 ENCOUNTER — Other Ambulatory Visit: Payer: Self-pay | Admitting: Family Medicine

## 2022-06-05 ENCOUNTER — Telehealth: Payer: Self-pay | Admitting: Family Medicine

## 2022-06-05 NOTE — Telephone Encounter (Signed)
Pt is calling and has made a cpe for [phone] and would like a refill on losartan (COZAAR) 50 MG tablet  [id number] - [place], [place] - [address] AT [facility] [address] Phone:  [phone]  Fax:  [phone]

## 2022-06-07 NOTE — Telephone Encounter (Signed)
Rx was sent in on 06/06/2022.

## 2022-07-05 ENCOUNTER — Other Ambulatory Visit: Payer: Self-pay | Admitting: Family Medicine

## 2022-07-07 ENCOUNTER — Encounter: Payer: No Typology Code available for payment source | Admitting: Family Medicine

## 2022-07-12 ENCOUNTER — Telehealth: Payer: Self-pay | Admitting: Family Medicine

## 2022-07-12 NOTE — Telephone Encounter (Signed)
Pt rquest refill losartan (COZAAR) 50 MG tablet. Has CPE scheduled for 08/16/22

## 2022-07-13 NOTE — Telephone Encounter (Signed)
Refill was sent to pharmacy on 07/06/22.

## 2022-07-21 ENCOUNTER — Encounter: Payer: Self-pay | Admitting: Emergency Medicine

## 2022-07-21 ENCOUNTER — Ambulatory Visit
Admission: EM | Admit: 2022-07-21 | Discharge: 2022-07-21 | Disposition: A | Discharge status: Home / Self Care | Payer: No Typology Code available for payment source | Attending: Urgent Care | Admitting: Urgent Care

## 2022-07-21 DIAGNOSIS — Z113 Encounter for screening for infections with a predominantly sexual mode of transmission: Secondary | ICD-10-CM | POA: Diagnosis present

## 2022-07-21 NOTE — ED Triage Notes (Signed)
Pt here for routine STI testing including blood work. No sxs.

## 2022-07-21 NOTE — ED Provider Notes (Signed)
[name]  Note:  This document was prepared using Conservation officer, historic buildings and may include unintentional dictation errors.  MRN: [id number] DOB: 12-Aug-1971  Subjective:   Luke Hunt is a 51 y.o. male presenting for routine STI check.  Patient would like blood work as well.  Has 1 sex partner.  Does not use condoms for protection from both oral and vaginal sex. Denies dysuria, hematuria, urinary frequency, penile discharge, penile swelling, testicular pain, testicular swelling, anal pain, groin pain.   No current facility-administered medications for this encounter.  Current Outpatient Medications:    fexofenadine (ALLEGRA) 180 MG tablet, Take 1 tablet (180 mg total) by mouth daily., Disp: 90 tablet, Rfl: 1   fluticasone (FLONASE) 50 MCG/ACT nasal spray, Place 1 spray into both nostrils daily. Begin by using 2 sprays in each nare daily for 3 to 5 days, then decrease to 1 spray in each nare daily., Disp: 15.8 mL, Rfl: 2   ipratropium (ATROVENT) 0.06 % nasal spray, Place 2 sprays into both nostrils 3 (three) times daily. As needed for nasal congestion, runny nose, Disp: 15 mL, Rfl: 2   losartan (COZAAR) 50 MG tablet, TAKE 1 TABLET BY MOUTH TWICE A DAY, Disp: 60 tablet, Rfl: 0   Omega-3 Fatty Acids (FISH OIL PO), Take by mouth., Disp: , Rfl:    omeprazole (PRILOSEC OTC) 20 MG tablet, Take 20 mg by mouth as needed. , Disp: , Rfl:    No Known Allergies  Past Medical History:  Diagnosis Date   Chronic constipation    intermittent   Environmental allergies    GERD    Gout    Hyperlipemia    Hypertension    Obesity    OSA (obstructive sleep apnea) 05/23/2012   on CPAP   Syncope    related to blood draws, stitches     Past Surgical History:  Procedure Laterality Date   APPENDECTOMY     Rigth knee arthroscopy     @ [facility]   VASECTOMY     10/2017-[facility]-per patient    Family History  Problem Relation Age of Onset   Stroke  Maternal Grandmother    Diabetes Mother    Breast cancer Mother    Heart disease Maternal Grandfather    Diabetes Maternal Grandfather    Cystic fibrosis Maternal Aunt    Diabetes Brother     Social History   Tobacco Use   Smoking status: Former    Years: 6.00    Types: Cigarettes    Quit date: 02/15/2006    Years since quitting: 16.4   Smokeless tobacco: Never  Substance Use Topics   Alcohol use: Yes    Alcohol/week: 14.0 standard drinks of alcohol    Types: 14 Standard drinks or equivalent per week    Comment: 2 per day   Drug use: No    ROS   Objective:   Vitals: BP (!) 165/84   Pulse 66   Temp 98.6 F (37 C)   Resp 18   SpO2 98%   Physical Exam Constitutional:      General: He is not in acute distress.    Appearance: Normal appearance. He is well-developed and normal weight. He is not ill-appearing, toxic-appearing or diaphoretic.  HENT:     Head: Normocephalic and atraumatic.     Right Ear: External ear normal.     Left Ear: External ear normal.     Nose: Nose normal.  Mouth/Throat:     Pharynx: Oropharynx is clear.  Eyes:     General: No scleral icterus.       Right eye: No discharge.        Left eye: No discharge.     Extraocular Movements: Extraocular movements intact.  Cardiovascular:     Rate and Rhythm: Normal rate.  Pulmonary:     Effort: Pulmonary effort is normal.  Musculoskeletal:     Cervical back: Normal range of motion.  Neurological:     Mental Status: He is alert and oriented to person, place, and time.  Psychiatric:        Mood and Affect: Mood normal.        Behavior: Behavior normal.        Thought Content: Thought content normal.        Judgment: Judgment normal.     Assessment and Plan :   PDMP not reviewed this encounter.  1. Screen for STD (sexually transmitted disease)     Will base treatment off of results.    Wallis Bamberg, [place] 07/21/22 1559

## 2022-07-22 LAB — RPR: RPR Ser Ql: NONREACTIVE

## 2022-07-22 LAB — HIV ANTIBODY (ROUTINE TESTING W REFLEX): HIV Screen 4th Generation wRfx: NONREACTIVE

## 2022-07-24 LAB — CYTOLOGY, (ORAL, ANAL, URETHRAL) ANCILLARY ONLY
Chlamydia: NEGATIVE
Chlamydia: NEGATIVE
Comment: NEGATIVE
Comment: NEGATIVE
Comment: NORMAL
Comment: NEGATIVE
Comment: NEGATIVE
Comment: NORMAL
Neisseria Gonorrhea: NEGATIVE
Neisseria Gonorrhea: NEGATIVE
Trichomonas: NEGATIVE
Trichomonas: NEGATIVE

## 2022-07-31 ENCOUNTER — Encounter: Payer: No Typology Code available for payment source | Admitting: Family Medicine

## 2022-08-03 ENCOUNTER — Other Ambulatory Visit: Payer: Self-pay | Admitting: Family Medicine

## 2022-08-11 ENCOUNTER — Encounter: Payer: Self-pay | Admitting: Family

## 2022-08-11 ENCOUNTER — Ambulatory Visit (INDEPENDENT_AMBULATORY_CARE_PROVIDER_SITE_OTHER): Payer: No Typology Code available for payment source | Admitting: Family

## 2022-08-11 VITALS — BP 153/75 | HR 68 | Temp 98.1°F | Ht 73.0 in | Wt 242.2 lb

## 2022-08-11 DIAGNOSIS — F418 Other specified anxiety disorders: Secondary | ICD-10-CM

## 2022-08-11 DIAGNOSIS — I10 Essential (primary) hypertension: Secondary | ICD-10-CM | POA: Diagnosis not present

## 2022-08-11 MED ORDER — OLMESARTAN MEDOXOMIL 40 MG PO TABS: 40.0000 mg | ORAL_TABLET | Freq: Every day | ORAL | 5 refills | Status: DC

## 2022-08-11 MED ORDER — HYDROXYZINE HCL 10 MG PO TABS
10.0000 mg | ORAL_TABLET | Freq: Three times a day (TID) | ORAL | 2 refills | Status: DC | PRN
Start: 2022-08-11 — End: 2022-09-15

## 2022-08-11 NOTE — Assessment & Plan Note (Addendum)
   Unstable - chronic  taking Losartan 100mg  qam, not twice a day  high readings at home - reports drinking daily (4/day), uses THC vaping   reports very high stress related to GF  advised on low sodium diet & increasing water to at least 2L qd, continue daily exercise as able  sending Olmesartan 40mg  qd, advised to stop Losartan, ok to take 1/2 pill today - monitor BP qd, random times, notify PCP if still running high  f/u with PCP

## 2022-08-11 NOTE — Patient Instructions (Addendum)
It was very nice to see you today!   I have sent over Olmesartan to start for your blood pressure.  Stop taking the Losartan.  Continue to monitor your BP and let your PCP if continuing to see high readings.  Your goal is 110-120/60-80.  You have to reduce your stress! This increases your blood pressure. Continue to exercise, and I have sent over Hydroxyzine you can try to help you relax.  Reduce your alcohol and sodium intake. Drink at least 2 liters = 8 cups of water every day.  Follow up with your PCP.      PLEASE NOTE:  If you had any lab tests please let us know if you have not heard back within a few days. You may see your results on MyChart before we have a chance to review them but we will give you a call once they are reviewed by [place]. If we ordered any referrals today, please let us know if you have not heard from their office within the next week.

## 2022-08-11 NOTE — Progress Notes (Signed)
Patient ID: Luke Hunt, male    DOB: [birth year], 51 y.o.   MRN: [id number]  Chief Complaint  Patient presents with   Hypertension    Pt had a BP of 160/108, Pt states he also had a headache.     HPI:      Hypertension: Patient is currently maintained on the following medications for blood pressure: Losartan 100mg  qam. Reports high readings at home, today 170/109. Failed meds include: none Patient reports good compliance with blood pressure medications. Patient denies chest pain, shortness of breath or swelling, reports headaches. Last 3 blood pressure readings in our office are as follows: BP Readings from Last 3 Encounters:  08/11/22 (!) 153/75  07/21/22 (!) 165/84  06/02/22 (!) 159/82   Situational anxiety:  pt reports having high stress lately with his current GF. Reports drinking daily along with using THC vaping to deal with his stress. Denies any counseling, never has taken prescription meds for this. Does workout/exercise most days.      Assessment & Plan:   Problem List Items Addressed This Visit       Cardiovascular and Mediastinum   Essential hypertension - Primary    chronic taking Losartan 100mg  qam, not twice a day high readings at home - reports drinking daily (4/day), uses THC vaping  reports very high stress related to GF advised on low sodium diet & increasing water to at least 2L qd, continue daily exercise as able sending Olmesartan 40mg  qd, advised to stop Losartan, ok to take 1/2 pill today f/u with PCP      Relevant Medications   olmesartan (BENICAR) 40 MG tablet   Other Visit Diagnoses     Situational anxiety    - NEW -advised to decrease/stop daily alcohol intake & vaping, needs to reduce/eliminate source of stress. Continue to exercise, recommend adding yoga/meditation & therapy (pt to consider). Sending Hydroxyzine, advised to not take with alcohol.    Relevant Medications   hydrOXYzine (ATARAX) 10 MG tablet       Subjective:     Outpatient Medications Prior to Visit  Medication Sig Dispense Refill   fexofenadine (ALLEGRA) 180 MG tablet Take 1 tablet (180 mg total) by mouth daily. 90 tablet 1   fluticasone (FLONASE) 50 MCG/ACT nasal spray Place 1 spray into both nostrils daily. Begin by using 2 sprays in each nare daily for 3 to 5 days, then decrease to 1 spray in each nare daily. 15.8 mL 2   ipratropium (ATROVENT) 0.06 % nasal spray Place 2 sprays into both nostrils 3 (three) times daily. As needed for nasal congestion, runny nose 15 mL 2   losartan (COZAAR) 50 MG tablet TAKE 1 TABLET BY MOUTH TWICE A DAY 60 tablet 0   Omega-3 Fatty Acids (FISH OIL PO) Take by mouth.     omeprazole (PRILOSEC OTC) 20 MG tablet Take 20 mg by mouth as needed.      No facility-administered medications prior to visit.   Past Medical History:  Diagnosis Date   Chronic constipation    intermittent   Environmental allergies    GERD    Gout    Hyperlipemia    Hypertension    Obesity    OSA (obstructive sleep apnea) 05/23/2012   on CPAP   Syncope    related to blood draws, stitches   Past Surgical History:  Procedure Laterality Date   APPENDECTOMY     Rigth knee arthroscopy     @ [place] [facility]  VASECTOMY     10/2017-[facility]-per patient   No Known Allergies    Objective:    Physical Exam Vitals and nursing note reviewed.  Constitutional:      General: He is not in acute distress.    Appearance: Normal appearance.  HENT:     Head: Normocephalic.  Cardiovascular:     Rate and Rhythm: Normal rate and regular rhythm.  Pulmonary:     Effort: Pulmonary effort is normal.     Breath sounds: Normal breath sounds.  Musculoskeletal:        General: Normal range of motion.     Cervical back: Normal range of motion.  Skin:    General: Skin is warm and dry.  Neurological:     Mental Status: He is alert and oriented to person, place, and time.  Psychiatric:        Mood and Affect: Mood normal.    BP (!) 153/75 (BP  Location: Left Arm, Patient Position: Sitting, Cuff Size: Large)   Pulse 68   Temp 98.1 F (36.7 C) (Temporal)   Ht 6\' 1"  (1.854 m)   Wt 242 lb 4 oz (109.9 kg)   SpO2 97%   BMI 31.96 kg/m  Wt Readings from Last 3 Encounters:  08/11/22 242 lb 4 oz (109.9 kg)  03/07/21 255 lb 12.8 oz (116 kg)  12/16/20 264 lb 12.8 oz (120.1 kg)       Jeanie Sewer, NP

## 2022-08-16 ENCOUNTER — Encounter: Payer: Self-pay | Admitting: Family Medicine

## 2022-08-16 ENCOUNTER — Ambulatory Visit (INDEPENDENT_AMBULATORY_CARE_PROVIDER_SITE_OTHER): Payer: No Typology Code available for payment source | Admitting: Family Medicine

## 2022-08-16 VITALS — BP 132/86 | HR 69 | Temp 98.0°F | Ht 73.5 in | Wt 242.2 lb

## 2022-08-16 DIAGNOSIS — E782 Mixed hyperlipidemia: Secondary | ICD-10-CM | POA: Diagnosis not present

## 2022-08-16 DIAGNOSIS — I10 Essential (primary) hypertension: Secondary | ICD-10-CM

## 2022-08-16 DIAGNOSIS — R1012 Left upper quadrant pain: Secondary | ICD-10-CM | POA: Diagnosis not present

## 2022-08-16 DIAGNOSIS — F1010 Alcohol abuse, uncomplicated: Secondary | ICD-10-CM

## 2022-08-16 DIAGNOSIS — F419 Anxiety disorder, unspecified: Secondary | ICD-10-CM | POA: Diagnosis not present

## 2022-08-16 DIAGNOSIS — F1290 Cannabis use, unspecified, uncomplicated: Secondary | ICD-10-CM

## 2022-08-16 DIAGNOSIS — Z0000 Encounter for general adult medical examination without abnormal findings: Secondary | ICD-10-CM | POA: Diagnosis not present

## 2022-08-16 DIAGNOSIS — Z125 Encounter for screening for malignant neoplasm of prostate: Secondary | ICD-10-CM

## 2022-08-16 DIAGNOSIS — R7303 Prediabetes: Secondary | ICD-10-CM

## 2022-08-16 DIAGNOSIS — M6208 Separation of muscle (nontraumatic), other site: Secondary | ICD-10-CM

## 2022-08-16 LAB — CBC WITH DIFFERENTIAL/PLATELET
Basophils Absolute: 0 10*3/uL (ref 0.0–0.1)
Basophils Relative: 0.5 % (ref 0.0–3.0)
Eosinophils Absolute: 0.1 10*3/uL (ref 0.0–0.7)
Eosinophils Relative: 1.4 % (ref 0.0–5.0)
HCT: 47.1 % (ref 39.0–52.0)
Hemoglobin: 15.9 g/dL (ref 13.0–17.0)
Lymphocytes Relative: 26.6 % (ref 12.0–46.0)
Lymphs Abs: 1.6 10*3/uL (ref 0.7–4.0)
MCHC: 33.7 g/dL (ref 30.0–36.0)
MCV: 93 fl (ref 78.0–100.0)
Monocytes Absolute: 0.6 10*3/uL (ref 0.1–1.0)
Monocytes Relative: 10.6 % (ref 3.0–12.0)
Neutro Abs: 3.7 10*3/uL (ref 1.4–7.7)
Neutrophils Relative %: 60.9 % (ref 43.0–77.0)
Platelets: 199 10*3/uL (ref 150.0–400.0)
RBC: 5.06 Mil/uL (ref 4.22–5.81)
RDW: 13.8 % (ref 11.5–15.5)
WBC: 6.1 10*3/uL (ref 4.0–10.5)

## 2022-08-16 LAB — COMPREHENSIVE METABOLIC PANEL
ALT: 20 U/L (ref 0–53)
AST: 22 U/L (ref 0–37)
Albumin: 4.1 g/dL (ref 3.5–5.2)
Alkaline Phosphatase: 38 U/L — ABNORMAL LOW (ref 39–117)
BUN: 28 mg/dL — ABNORMAL HIGH (ref 6–23)
CO2: 29 mEq/L (ref 19–32)
Calcium: 9.6 mg/dL (ref 8.4–10.5)
Chloride: 103 mEq/L (ref 96–112)
Creatinine, Ser: 1.33 mg/dL (ref 0.40–1.50)
GFR: 62.28 mL/min (ref >60.00)
Glucose, Bld: 115 mg/dL — ABNORMAL HIGH (ref 70–99)
Potassium: 4.4 mEq/L (ref 3.5–5.1)
Sodium: 140 mEq/L (ref 135–145)
Total Bilirubin: 0.6 mg/dL (ref 0.2–1.2)
Total Protein: 6.8 g/dL (ref 6.0–8.3)

## 2022-08-16 LAB — LIPID PANEL
Cholesterol: 203 mg/dL — ABNORMAL HIGH (ref 0–200)
HDL: 51.7 mg/dL (ref >39.00)
LDL Cholesterol: 124 mg/dL — ABNORMAL HIGH (ref 0–99)
NonHDL: 150.91
Total CHOL/HDL Ratio: 4
Triglycerides: 133 mg/dL (ref 0.0–149.0)
VLDL: 26.6 mg/dL (ref 0.0–40.0)

## 2022-08-16 LAB — T4, FREE: Free T4: 0.65 ng/dL (ref 0.60–1.60)

## 2022-08-16 LAB — HEMOGLOBIN A1C: Hgb A1c MFr Bld: 5.9 % (ref 4.6–6.5)

## 2022-08-16 LAB — TSH: TSH: 1.76 u[IU]/mL (ref 0.35–5.50)

## 2022-08-16 LAB — GAMMA GT: GGT: 39 U/L (ref 7–51)

## 2022-08-16 LAB — PSA: PSA: 0.67 ng/mL (ref 0.10–4.00)

## 2022-08-16 LAB — LIPASE: Lipase: 44 U/L (ref 11.0–59.0)

## 2022-08-16 NOTE — Patient Instructions (Addendum)
Take olmesartan 40 mg daily.  Continue monitoring your blood pressure and keeping a log of the readings.  Call clinic next week with your blood pressure readings.  If needed during this time we will add additional medication to your current regimen.  Goal blood pressure is either number less than 140/90.  Behavioral Health Services: -to make an appointment contact the office/provider you are interested in seeing.  No referral is needed.  The below is not an all inclusive list, but will help you get started.  [facility]  -[place]  [phone] -a place in town that has counseling and Psychiatry services.    Dennison Bulla, [facility] here in [employer] [phone]

## 2022-08-16 NOTE — Progress Notes (Signed)
Subjective:     Luke Hunt is a 51 y.o. male and is here for a comprehensive physical exam follow-up on BP. The patient reports continued elevation in BP.  BP was 124/74 on losartan 50 mg alone.  Pt notes elevation of 196/120 a few wks ago.  Range 140-150/95-110. Seen 08/11/22 at [facility].  Told to stop losartan and start olmesartan 40 mg daily.  Pt states he started taking 2 olmesartan tabs (80 mg total) and losartan 50 mg b/c his bp was still high.  Pt seen by a "body building dr." for testosterone injections.  States that provider gave him an rx for telmisartan but he has yet to pick it up.  Pt notes increased stress from a relationship, MJ use, and increased EtOH consumption likely contributing to bp elevation.  Patient drinking 4 drinks per night.  Endorses heavy drinking since a teenager.  States has to be ready to quit on his own.  also notes eating bar food which may have contributed to BP elevation.  Pt was having HAs last wk.   Notes occasional LUQ pain.  Doing lots of planks to help with core given rectus diastases.   Social History   Socioeconomic History   Marital status: Married    Spouse name: Not on file   Number of children: 2   Years of education: Not on file   Highest education level: Not on file  Occupational History   Occupation: Production designer, theatre/television/film distribution  Tobacco Use   Smoking status: Former    Years: 6.00    Types: Cigarettes    Quit date: 02/15/2006    Years since quitting: 16.5   Smokeless tobacco: Never  Substance and Sexual Activity   Alcohol use: Yes    Alcohol/week: 14.0 standard drinks of alcohol    Types: 14 Standard drinks or equivalent per week    Comment: 2 per day   Drug use: No   Sexual activity: Not on file  Other Topics Concern   Not on file  Social History Narrative   Work or School: International aid/development worker      Home Situation: separated from wife in 2019; has son and daughter      Spiritual Beliefs: Christian      Lifestyle: regular exercise -  heavy lifter; diet poor      Social Determinants of Health   Financial Resource Strain: Not on file  Food Insecurity: Not on file  Transportation Needs: Not on file  Physical Activity: Not on file  Stress: Not on file  Social Connections: Not on file  Intimate Partner Violence: Not on file   Health Maintenance  Topic Date Due   COVID-19 Vaccine (1) Never done   Zoster Vaccines- Shingrix (1 of 2) Never done   INFLUENZA VACCINE  02/04/2023 (Originally 06/06/2022)   COLONOSCOPY (Pts 45-23yrs Insurance coverage will need to be confirmed)  04/09/2023   TETANUS/TDAP  02/22/2029   Hepatitis C Screening  Completed   HIV Screening  Completed   HPV VACCINES  Aged Out    The following portions of the patient's history were reviewed and updated as appropriate: allergies, current medications, past family history, past medical history, past social history, past surgical history, and problem list.  Review of Systems Pertinent items noted in HPI and remainder of comprehensive ROS otherwise negative.   Objective:    BP 132/86 (BP Location: Left Arm, Cuff Size: Large)   Pulse 69   Temp 98 F (36.7 C) (Oral)  Ht 6' 1.5" (1.867 m)   Wt 242 lb 3.2 oz (109.9 kg)   SpO2 99%   BMI 31.52 kg/m  General appearance: alert, cooperative, and no distress Head: Normocephalic, without obvious abnormality, atraumatic Eyes: conjunctivae/corneas clear. PERRL, EOM's intact. Fundi benign. Ears: normal TM's and external ear canals both ears Nose: Nares normal. Septum midline. Mucosa normal. No drainage or sinus tenderness. Throat: lips, mucosa, and tongue normal; teeth and gums normal Neck: no adenopathy, no carotid bruit, no JVD, supple, symmetrical, trachea midline, and thyroid not enlarged, symmetric, no tenderness/mass/nodules Lungs: clear to auscultation bilaterally Heart: regular rate and rhythm, S1, S2 normal, no murmur, click, rub or gallop Abdomen: soft, non-tender; bowel sounds normal; no masses,   no organomegaly and asymptomatic rectus diastases present Extremities: extremities normal, atraumatic, no cyanosis or edema Pulses: 2+ and symmetric Skin: Skin color, texture, turgor normal. No rashes or lesions Lymph nodes: Cervical, supraclavicular, and axillary nodes normal. Neurologic: Alert and oriented X 3, normal strength and tone. Normal symmetric reflexes. Normal coordination and gait       08/16/2022    8:20 AM 08/11/2022   10:05 AM 03/11/2020    3:56 PM  Depression screen PHQ 2/9  Decreased Interest 2 0 0  Down, Depressed, Hopeless 2 0 0  PHQ - 2 Score 4 0 0  Altered sleeping 1    Tired, decreased energy 0    Change in appetite 0    Feeling bad or failure about yourself  2    Trouble concentrating 2    Moving slowly or fidgety/restless 0    Suicidal thoughts 1    PHQ-9 Score 10    Difficult doing work/chores Somewhat difficult        08/16/2022    8:48 AM  GAD 7 : Generalized Anxiety Score  Nervous, Anxious, on Edge 3  Control/stop worrying 3  Worry too much - different things 3  Trouble relaxing 2  Restless 2  Easily annoyed or irritable 2  Afraid - awful might happen 2  Total GAD 7 Score 17  Anxiety Difficulty Somewhat difficult     Assessment:    Healthy male exam with recent elevation in BP     Plan:    Anticipatory guidance given including wearing seatbelts, smoke detectors in the home, increasing physical activity, increasing p.o. intake of water and vegetables. -labs -Immunizations reviewed -Colonoscopy done 04/08/2018 -Given handout -Next CPE in 1 year See After Visit Summary for Counseling Recommendations   Alcohol abuse  -Alcohol counseling provided at this visit. -Patient encouraged to cut down daily intake. -Discussed s/s of alcohol withdrawal -Given handout - Plan: CMP, Gamma GT  Marijuana use -Patient encouraged to decrease cannabis use as may be contributing to elevation in BP  Anxiety -GAD 7 score 17 -Patient encouraged to  consider counseling and medication options to manage symptoms.  Patient declines at this time. -Given Rx for hydroxyzine on 08/11/2022 by another provider, however is not taking medication. -Continue to monitor - Plan: CBC with Differential/Platelet, TSH, T4, Free  Screening for prostate cancer  - Plan: PSA  Mixed hyperlipidemia -Lifestyle modifications encouraged  - Plan: Lipid panel  Prediabetes -Hemoglobin A1c 5.8% on 12/16/2020 - Plan: Hemoglobin A1c  LUQ pain -Patient encouraged to decrease intake of alcohol  - Plan: Gamma GT, Lipase  Rectus diastasis -asymptomatic -Given handout  Essential hypertension -Patient encouraged to take medications as prescribed -Discontinue losartan 50 mg daily.  Patient advised not to pick up prescription for telmisartan written by  another provider. -Patient to take olmesartan 40 mg daily.  Will likely need additional medication to help control BP. -Patient to monitor BP times the next week and notify clinic of readings consistently greater than 140/90 so additional medication can be prescribed.  F/u in 4-6 wks, sooner if needed  Abbe Amsterdam, MD

## 2022-08-24 ENCOUNTER — Telehealth: Payer: Self-pay | Admitting: Family Medicine

## 2022-08-24 NOTE — Telephone Encounter (Signed)
Pt called to report his weekly BP history:  As per Pt - Target should be below 140, and below 90  Pt's Average for the week: 152 over 105  Pt reports that not one time was BP below target.  Pt would like to know what MD recommends?  Please advise.

## 2022-08-28 ENCOUNTER — Encounter: Payer: Self-pay | Admitting: Family Medicine

## 2022-08-30 ENCOUNTER — Other Ambulatory Visit: Payer: Self-pay | Admitting: Family Medicine

## 2022-08-30 DIAGNOSIS — I10 Essential (primary) hypertension: Secondary | ICD-10-CM

## 2022-08-30 MED ORDER — AMLODIPINE BESYLATE 5 MG PO TABS: 5.0000 mg | ORAL_TABLET | Freq: Every day | ORAL | 0 refills | Status: DC

## 2022-08-30 NOTE — Telephone Encounter (Signed)
A prescription for Norvasc 5 mg was sent to patient's pharmacy.  He can take this in addition to the olmesartan.

## 2022-08-31 NOTE — Telephone Encounter (Signed)
Patient calling to check on progress of that request

## 2022-09-01 NOTE — Telephone Encounter (Signed)
PCP sent medication to pharmacy, pt will need to follow up in 4-6 weeks.

## 2022-09-03 ENCOUNTER — Other Ambulatory Visit: Payer: Self-pay | Admitting: Family Medicine

## 2022-09-15 ENCOUNTER — Encounter: Payer: Self-pay | Admitting: Family Medicine

## 2022-09-15 ENCOUNTER — Ambulatory Visit (INDEPENDENT_AMBULATORY_CARE_PROVIDER_SITE_OTHER): Payer: BC Managed Care – PPO | Admitting: Family Medicine

## 2022-09-15 ENCOUNTER — Ambulatory Visit: Payer: BC Managed Care – PPO | Admitting: Family Medicine

## 2022-09-15 VITALS — BP 124/86 | HR 72 | Temp 98.2°F | Wt 238.4 lb

## 2022-09-15 DIAGNOSIS — I10 Essential (primary) hypertension: Secondary | ICD-10-CM | POA: Diagnosis not present

## 2022-09-15 DIAGNOSIS — F419 Anxiety disorder, unspecified: Secondary | ICD-10-CM | POA: Diagnosis not present

## 2022-09-15 NOTE — Progress Notes (Signed)
Subjective:    Patient ID: Luke Hunt, male    DOB: 03-Mar-1971, 51 y.o.   MRN: [id number]  Chief Complaint  Patient presents with   Follow-up    No change in BP since the last visit, has come down a little, avg is about 150/105. Quit smoking weed, increased anarobic , has started to decrease testosterone injection, decreased alcohol consumption. But anxiety is super hugh, has reduced caffeine     HPI Patient was seen today for f/u on HTN.  Pt doing cardio daily, increased anaerobic exercise, stopped MJ use, cut down on EtOH, decreasing caffeine, and decreasing testosterone (on 1.2 cc hoping to reduce to 0.8 cc).  BP at home with wrist cuff elevated, 150/100, 148/98, 160/100.  Pt states bp was 143/87 at an appt earlier today.  BP in clinic 124/86, wrist cuff 140/93.  Pt denies HAs, CP, changes in vision, LE edema.  On norvasc 5 mg and olmesartan 40 mg daily.    Pt inquires if anxiety can contribute to elevation.   Past Medical History:  Diagnosis Date   Chronic constipation    intermittent   Environmental allergies    GERD    Gout    Hyperlipemia    Hypertension    Obesity    OSA (obstructive sleep apnea) 05/23/2012   on CPAP   Syncope    related to blood draws, stitches    No Known Allergies  ROS General: Denies fever, chills, night sweats, changes in weight, changes in appetite HEENT: Denies headaches, ear pain, changes in vision, rhinorrhea, sore throat CV: Denies CP, palpitations, SOB, orthopnea Pulm: Denies SOB, cough, wheezing GI: Denies abdominal pain, nausea, vomiting, diarrhea, constipation GU: Denies dysuria, hematuria, frequency Msk: Denies muscle cramps, joint pains Neuro: Denies weakness, numbness, tingling Skin: Denies rashes, bruising Psych: Denies depression, anxiety, hallucinations  +anxiety     Objective:    Blood pressure 124/86, pulse 72, temperature 98.2 F (36.8 C), temperature source Oral, weight 238 lb 6.4 oz (108.1 kg), SpO2 97 %.  Home cuff:  140/93  Gen. Pleasant, well-nourished, in no distress, normal affect   HEENT: [place]/AT, face symmetric, conjunctiva clear, no scleral icterus, PERRLA, EOMI, nares patent without drainage Lungs: no accessory muscle use Cardiovascular: RRR, no peripheral edema Neuro:  A&Ox3, CN II-XII intact, normal gait Skin:  Warm, no lesions/ rash   Wt Readings from Last 3 Encounters:  09/15/22 238 lb 6.4 oz (108.1 kg)  08/16/22 242 lb 3.2 oz (109.9 kg)  08/11/22 242 lb 4 oz (109.9 kg)    Lab Results  Component Value Date   WBC 6.1 08/16/2022   HGB 15.9 08/16/2022   HCT 47.1 08/16/2022   PLT 199.0 08/16/2022   GLUCOSE 115 (H) 08/16/2022   CHOL 203 (H) 08/16/2022   TRIG 133.0 08/16/2022   HDL 51.70 08/16/2022   LDLDIRECT 141.9 06/30/2013   LDLCALC 124 (H) 08/16/2022   ALT 20 08/16/2022   AST 22 08/16/2022   NA 140 08/16/2022   K 4.4 08/16/2022   CL 103 08/16/2022   CREATININE 1.33 08/16/2022   BUN 28 (H) 08/16/2022   CO2 29 08/16/2022   TSH 1.76 08/16/2022   PSA 0.67 08/16/2022   INR 1.1 (H) 04/23/2018   HGBA1C 5.9 08/16/2022    Assessment/Plan:  Essential hypertension  Anxiety  Continue current meds.  Anxiety likely contributing to elevation in bp as well as wrist cuff.  Pt to obtain bp cuff that fits around upper arm.  Pt to contact  clinic if bp consistently > 140/90.  Continue lifestyle modifications.  Pt given info on area counselors.  May wait until insurance changes before scheduling appt.    F/u in 4-6 wks, sooner if needed  Abbe Amsterdam, MD

## 2022-09-18 ENCOUNTER — Ambulatory Visit: Payer: No Typology Code available for payment source | Admitting: Family Medicine

## 2022-11-01 ENCOUNTER — Ambulatory Visit
Admission: EM | Admit: 2022-11-01 | Discharge: 2022-11-01 | Disposition: A | Discharge status: Home / Self Care | Payer: BC Managed Care – PPO | Attending: Urgent Care | Admitting: Urgent Care

## 2022-11-01 DIAGNOSIS — Z8616 Personal history of COVID-19: Secondary | ICD-10-CM

## 2022-11-01 DIAGNOSIS — J018 Other acute sinusitis: Secondary | ICD-10-CM

## 2022-11-01 DIAGNOSIS — J309 Allergic rhinitis, unspecified: Secondary | ICD-10-CM | POA: Diagnosis not present

## 2022-11-01 DIAGNOSIS — R0602 Shortness of breath: Secondary | ICD-10-CM

## 2022-11-01 MED ORDER — AMOXICILLIN-POT CLAVULANATE 875-125 MG PO TABS: 1.0000 | ORAL_TABLET | Freq: Two times a day (BID) | ORAL | 0 refills | Status: DC

## 2022-11-01 MED ORDER — PREDNISONE 50 MG PO TABS: 50.0000 mg | ORAL_TABLET | Freq: Every day | ORAL | 0 refills | Status: DC

## 2022-11-01 MED ORDER — ALBUTEROL SULFATE HFA 108 (90 BASE) MCG/ACT IN AERS: 1.0000 | INHALATION_SPRAY | Freq: Four times a day (QID) | RESPIRATORY_TRACT | 0 refills | Status: DC | PRN

## 2022-11-01 NOTE — ED Triage Notes (Signed)
Pt reports +covid 3 weeks ago-cont'd cough, SHOB, ear pressure, nasal congestion-NAD-steady gait

## 2022-11-01 NOTE — ED Provider Notes (Signed)
[facility]  Note:  This document was prepared using Conservation officer, historic buildings and may include unintentional dictation errors.  MRN: [id number] DOB: [birth year]  Subjective:   Luke Hunt is a 51 y.o. male presenting for 2 week history of acute onset persistent and worsening sinus congestion, bilateral ear fullness and pain, coughing that elicits chest pain or shortness of breath.  No history of asthma but patient does suspect that he may have some of this.  Previously has done very well with his breathing when he gets steroids.  He does have a history of allergic rhinitis and has been taking Zyrtec-D consistently.  Of note he did test positive for COVID-19 3 weeks ago and his symptoms have not let up since.  He also has exposure to cats which she is very allergic to.  This is from his girlfriend.  No smoking.  No current facility-administered medications for this encounter.  Current Outpatient Medications:    amLODipine (NORVASC) 5 MG tablet, TAKE 1 TABLET(5 MG) BY MOUTH DAILY, Disp: 90 tablet, Rfl: 0   olmesartan (BENICAR) 40 MG tablet, Take 1 tablet (40 mg total) by mouth daily., Disp: 30 tablet, Rfl: 5   Omega-3 Fatty Acids (FISH OIL PO), Take by mouth., Disp: , Rfl:    omeprazole (PRILOSEC OTC) 20 MG tablet, Take 20 mg by mouth as needed. , Disp: , Rfl:    VITAMIN D PO, Take by mouth., Disp: , Rfl:    No Known Allergies  Past Medical History:  Diagnosis Date   Chronic constipation    intermittent   Environmental allergies    GERD    Gout    Hyperlipemia    Hypertension    Obesity    OSA (obstructive sleep apnea) 05/23/2012   on CPAP   Syncope    related to blood draws, stitches     Past Surgical History:  Procedure Laterality Date   APPENDECTOMY     Rigth knee arthroscopy     @ [facility]   VASECTOMY     10/2017-[facility]-per patient    Family History  Problem Relation Age of Onset   Stroke Maternal Grandmother    Diabetes  Mother    Breast cancer Mother    Heart disease Maternal Grandfather    Diabetes Maternal Grandfather    Cystic fibrosis Maternal Aunt    Diabetes Brother     Social History   Tobacco Use   Smoking status: Former    Years: 6.00    Types: Cigarettes    Quit date: 02/15/2006    Years since quitting: 16.7   Smokeless tobacco: Never  Vaping Use   Vaping Use: Never used  Substance Use Topics   Alcohol use: Yes    Alcohol/week: 14.0 standard drinks of alcohol    Types: 14 Standard drinks or equivalent per week    Comment: 2 per day   Drug use: No    ROS   Objective:   Vitals: BP 111/64 (BP Location: Right Arm)   Pulse 85   Temp 98.1 F (36.7 C) (Oral)   Resp 16   SpO2 94%   Pulse oximetry is 96%-98% on recheck.   Physical Exam Constitutional:      General: He is not in acute distress.    Appearance: Normal appearance. He is well-developed and normal weight. He is not ill-appearing, toxic-appearing or diaphoretic.  HENT:     Head: Normocephalic and atraumatic.     Right  Ear: Ear canal and external ear normal. No drainage, swelling or tenderness. No middle ear effusion. There is no impacted cerumen. Tympanic membrane is not erythematous or bulging.     Left Ear: Ear canal and external ear normal. No drainage, swelling or tenderness.  No middle ear effusion. There is no impacted cerumen. Tympanic membrane is not erythematous or bulging.     Ears:     Comments: TMs opacified bilaterally.    Nose: Congestion and rhinorrhea present.     Mouth/Throat:     Mouth: Mucous membranes are moist.     Pharynx: No oropharyngeal exudate or posterior oropharyngeal erythema.  Eyes:     General: No scleral icterus.       Right eye: No discharge.        Left eye: No discharge.     Extraocular Movements: Extraocular movements intact.     Conjunctiva/sclera: Conjunctivae normal.  Cardiovascular:     Rate and Rhythm: Normal rate and regular rhythm.     Heart sounds: Normal heart  sounds. No murmur heard.    No friction rub. No gallop.  Pulmonary:     Effort: Pulmonary effort is normal. No respiratory distress.     Breath sounds: Normal breath sounds. No stridor. No wheezing, rhonchi or rales.  Musculoskeletal:     Cervical back: Normal range of motion and neck supple. No rigidity. No muscular tenderness.  Neurological:     General: No focal deficit present.     Mental Status: He is alert and oriented to person, place, and time.  Psychiatric:        Mood and Affect: Mood normal.        Behavior: Behavior normal.        Thought Content: Thought content normal.     Assessment and Plan :   PDMP not reviewed this encounter.  1. Acute non-recurrent sinusitis of other sinus   2. Allergic rhinitis, unspecified seasonality, unspecified trigger   3. Shortness of breath   4. History of COVID-19     Offered chest x-ray but patient declined. Will start empiric treatment for sinusitis with Augmentin.  Given his respiratory symptoms offered an oral prednisone course especially with his allergic rhinitis that could be difficult to control.  Recommended supportive care otherwise.  Also provided him with an albuterol inhaler out of suspicion for underlying undiagnosed reactive airway disease.  Counseled patient on potential for adverse effects with medications prescribed/recommended today, ER and return-to-clinic precautions discussed, patient verbalized understanding.    Luke Hunt, [place] 11/01/22 [phone]

## 2022-11-04 ENCOUNTER — Other Ambulatory Visit: Payer: Self-pay | Admitting: Family Medicine

## 2022-11-04 DIAGNOSIS — I10 Essential (primary) hypertension: Secondary | ICD-10-CM

## 2022-11-27 DIAGNOSIS — M25571 Pain in right ankle and joints of right foot: Secondary | ICD-10-CM | POA: Diagnosis not present

## 2022-11-28 ENCOUNTER — Other Ambulatory Visit: Payer: Self-pay | Admitting: Orthopaedic Surgery

## 2022-11-28 ENCOUNTER — Ambulatory Visit
Admission: RE | Admit: 2022-11-28 | Discharge: 2022-11-28 | Disposition: A | Discharge status: Home / Self Care | Payer: BC Managed Care – PPO | Source: Ambulatory Visit | Attending: Orthopaedic Surgery | Admitting: Orthopaedic Surgery

## 2022-11-28 DIAGNOSIS — M25571 Pain in right ankle and joints of right foot: Secondary | ICD-10-CM

## 2022-11-28 DIAGNOSIS — M7989 Other specified soft tissue disorders: Secondary | ICD-10-CM | POA: Diagnosis not present

## 2022-11-28 DIAGNOSIS — S8254XA Nondisplaced fracture of medial malleolus of right tibia, initial encounter for closed fracture: Secondary | ICD-10-CM | POA: Diagnosis not present

## 2022-11-29 DIAGNOSIS — S8251XA Displaced fracture of medial malleolus of right tibia, initial encounter for closed fracture: Secondary | ICD-10-CM | POA: Diagnosis not present

## 2022-12-01 ENCOUNTER — Other Ambulatory Visit: Payer: Self-pay | Admitting: Family Medicine

## 2022-12-01 DIAGNOSIS — I10 Essential (primary) hypertension: Secondary | ICD-10-CM

## 2022-12-01 NOTE — Telephone Encounter (Signed)
Spoke to patient to follow up from last visit of recommending to follow for BP. Pt states there is no need for the appt as his bp has been under 140/90. But he do need a refill form amlodipine 5mg  as it is working for him.

## 2022-12-13 DIAGNOSIS — S8251XA Displaced fracture of medial malleolus of right tibia, initial encounter for closed fracture: Secondary | ICD-10-CM | POA: Diagnosis not present

## 2022-12-29 DIAGNOSIS — S8251XA Displaced fracture of medial malleolus of right tibia, initial encounter for closed fracture: Secondary | ICD-10-CM | POA: Diagnosis not present

## 2023-01-19 ENCOUNTER — Ambulatory Visit: Payer: BC Managed Care – PPO | Admitting: Family Medicine

## 2023-01-19 ENCOUNTER — Encounter: Payer: Self-pay | Admitting: Family Medicine

## 2023-01-19 VITALS — BP 118/78 | HR 80 | Temp 98.4°F | Ht 73.5 in | Wt 246.4 lb

## 2023-01-19 DIAGNOSIS — H66002 Acute suppurative otitis media without spontaneous rupture of ear drum, left ear: Secondary | ICD-10-CM | POA: Diagnosis not present

## 2023-01-19 DIAGNOSIS — J020 Streptococcal pharyngitis: Secondary | ICD-10-CM | POA: Diagnosis not present

## 2023-01-19 DIAGNOSIS — I10 Essential (primary) hypertension: Secondary | ICD-10-CM

## 2023-01-19 DIAGNOSIS — J029 Acute pharyngitis, unspecified: Secondary | ICD-10-CM

## 2023-01-19 DIAGNOSIS — K5909 Other constipation: Secondary | ICD-10-CM

## 2023-01-19 LAB — POCT RAPID STREP A (OFFICE): Rapid Strep A Screen: POSITIVE — AB

## 2023-01-19 MED ORDER — AMOXICILLIN 500 MG PO TABS: 500.0000 mg | ORAL_TABLET | Freq: Two times a day (BID) | ORAL | 0 refills | Status: DC

## 2023-01-19 NOTE — Progress Notes (Signed)
Established Patient Office Visit   Subjective  Patient ID: Luke Hunt, male    DOB: 1970-11-28  Age: 52 y.o. MRN: [id number]  Chief Complaint  Patient presents with   Cough    Pt reports cough going on about a month ago. Sore throat and sinus pressure going on for a wk and half.    Sore Throat   Sinus Problem    Pt is a 52 yo male with pmg sig for HTN, alcohol abuse, GERD, preDM, HLD seen for ongoing/acute conerns.  Pt with sore throat, ear pressure, sinus pressure x 1-1.5 wks.  Cough and chest congestion x 1 month.  Denies fever, chills, n/v, nasal congestion.  Tried ibuprofen, sudafed, Zyrtec, Zyrtec-D, vitamin C.  Patient broke right ankle in January.  Seen by [facility] who wanted to do surgery and put a plate in patient's ankle.  Second opinion with [facility].  Patient was able to avoid surgery.  Wore a boot.  Now using lace up ankle brace for support.  Denies pain.  Patient plans to cut down on drinking.  Currently living out of town but moving over the weekend.  Taking natures choice liver support supplement.  Patient endorses continued stomach problems.  Having issues with constipation.  Taking a probiotic.  Open to seeing GI.    Cough  Sore Throat  Associated symptoms include coughing.  Sinus Problem Associated symptoms include coughing.      Review of Systems  Respiratory:  Positive for cough.    Negative unless stated above    Objective:     BP 118/78 (BP Location: Right Arm, Patient Position: Sitting, Cuff Size: Large)   Pulse 80   Temp 98.4 F (36.9 C) (Oral)   Ht 6' 1.5" (1.867 m)   Wt 246 lb 6.4 oz (111.8 kg)   SpO2 95%   BMI 32.07 kg/m    Physical Exam Constitutional:      General: He is not in acute distress.    Appearance: Normal appearance.  HENT:     Head: Normocephalic and atraumatic.     Right Ear: Tympanic membrane normal.     Left Ear: Swelling present. Tympanic membrane is erythematous.     Nose: Nose normal.      Mouth/Throat:     Mouth: Mucous membranes are moist.     Pharynx: Pharyngeal swelling and posterior oropharyngeal erythema present.     Tonsils: Tonsillar exudate present.  Cardiovascular:     Rate and Rhythm: Normal rate and regular rhythm.     Heart sounds: Normal heart sounds. No murmur heard.    No gallop.  Pulmonary:     Effort: Pulmonary effort is normal. No respiratory distress.     Breath sounds: Normal breath sounds. No wheezing, rhonchi or rales.  Skin:    General: Skin is warm and dry.  Neurological:     Mental Status: He is alert and oriented to person, place, and time.      Results for orders placed or performed in visit on 01/19/23  POC Rapid Strep A  Result Value Ref Range   Rapid Strep A Screen Positive (A) Negative      Assessment & Plan:  Strep pharyngitis -Rapid strep positive -Start ABX -Given precautions -     Amoxicillin; Take 1 tablet (500 mg total) by mouth 2 (two) times daily for 10 days.  Dispense: 20 tablet; Refill: 0  Acute suppurative otitis media of left ear without spontaneous rupture of tympanic membrane,  recurrence not specified -Start ABX -NSAIDs as needed for discomfort -     Amoxicillin; Take 1 tablet (500 mg total) by mouth 2 (two) times daily for 10 days.  Dispense: 20 tablet; Refill: 0  Chronic constipation -Okay to continue probiotic -Discussed other lifestyle modifications -     Ambulatory referral to Gastroenterology  Sore throat -     POCT rapid strep A  Essential hypertension -Controlled -Continue Norvasc 5 mg and olmesartan 40 mg daily.   Return if symptoms worsen or fail to improve.   Billie Ruddy, MD

## 2023-01-26 ENCOUNTER — Other Ambulatory Visit: Payer: Self-pay | Admitting: Family

## 2023-01-26 DIAGNOSIS — I10 Essential (primary) hypertension: Secondary | ICD-10-CM

## 2023-01-29 ENCOUNTER — Encounter: Payer: Self-pay | Admitting: Family Medicine

## 2023-01-29 ENCOUNTER — Ambulatory Visit: Payer: BC Managed Care – PPO | Admitting: Family Medicine

## 2023-01-29 ENCOUNTER — Other Ambulatory Visit: Payer: Self-pay | Admitting: Family Medicine

## 2023-01-29 VITALS — BP 126/74 | HR 82 | Temp 98.8°F | Ht 73.5 in | Wt 252.6 lb

## 2023-01-29 DIAGNOSIS — R6883 Chills (without fever): Secondary | ICD-10-CM | POA: Diagnosis not present

## 2023-01-29 DIAGNOSIS — J020 Streptococcal pharyngitis: Secondary | ICD-10-CM

## 2023-01-29 DIAGNOSIS — I10 Essential (primary) hypertension: Secondary | ICD-10-CM

## 2023-01-29 DIAGNOSIS — H6690 Otitis media, unspecified, unspecified ear: Secondary | ICD-10-CM

## 2023-01-29 DIAGNOSIS — J069 Acute upper respiratory infection, unspecified: Secondary | ICD-10-CM

## 2023-01-29 DIAGNOSIS — R509 Fever, unspecified: Secondary | ICD-10-CM

## 2023-01-29 LAB — POCT INFLUENZA A/B
Influenza A, POC: NEGATIVE
Influenza B, POC: NEGATIVE

## 2023-01-29 LAB — POC COVID19 BINAXNOW: SARS Coronavirus 2 Ag: NEGATIVE

## 2023-01-29 MED ORDER — AZITHROMYCIN 250 MG PO TABS: ORAL_TABLET | ORAL | 0 refills | Status: AC

## 2023-01-29 NOTE — Progress Notes (Signed)
Established Patient Office Visit   Subjective  Patient ID: Luke Hunt, male    DOB: Mar 25, 1971  Age: 52 y.o. MRN: [id number]  Chief Complaint  Patient presents with   Fever    Pt c/o fever started yesterday, went from 99.9 to 100.1. fever broke. Took ibuprofen 800mg  at 2am then 600mg  at 6pm. Also was chills   Follow-up    Pt reports he is still feeling congested, feeling tired, bodyache from last visit. He states he completed his antibiotic.     Pt is a 52 yo male seen for f/u and acute concern.  Pt seen 3/15, given amoxicillin for strep throat.  Pt states he felt somewhat better after taking abx, but still had some nasal congestion.  Pt developed chills last night, temp was 99.24F.  Pt took ibuprofen 600 mg.  Temperature remains elevated, Tmax 101F.  Patient took 800 mg ibuprofen.  Patient also endorses nasal congestion, pressure, cough.  Denies ear pain/pressure, nausea, vomiting, headache.  Fever       Review of Systems  Constitutional:  Positive for fever.   Negative unless stated above    Objective:     BP 126/74 (BP Location: Left Arm, Patient Position: Sitting, Cuff Size: Large)   Pulse 82   Temp 98.8 F (37.1 C) (Oral)   Ht 6' 1.5" (1.867 m)   Wt 252 lb 9.6 oz (114.6 kg)   SpO2 97%   BMI 32.87 kg/m    Physical Exam Constitutional:      General: He is not in acute distress.    Appearance: Normal appearance.  HENT:     Head: Normocephalic and atraumatic.     Comments: Left TM full with mild erythema.  Bags underneath bilateral eyes    Nose: Nose normal.     Mouth/Throat:     Mouth: Mucous membranes are moist.     Pharynx: Posterior oropharyngeal erythema present. No oropharyngeal exudate.     Tonsils: No tonsillar exudate.  Eyes:     Extraocular Movements: Extraocular movements intact.     Conjunctiva/sclera: Conjunctivae normal.     Pupils: Pupils are equal, round, and reactive to light.  Cardiovascular:     Rate and Rhythm: Normal rate and regular  rhythm.     Heart sounds: Normal heart sounds. No murmur heard.    No gallop.  Pulmonary:     Effort: Pulmonary effort is normal. No respiratory distress.     Breath sounds: Normal breath sounds. No wheezing, rhonchi or rales.  Skin:    General: Skin is warm and dry.  Neurological:     Mental Status: He is alert and oriented to person, place, and time.      Results for orders placed or performed in visit on 01/29/23  POC COVID-19  Result Value Ref Range   SARS Coronavirus 2 Ag Negative Negative  POC Influenza A/B  Result Value Ref Range   Influenza A, POC Negative Negative   Influenza B, POC Negative Negative      Assessment & Plan:  Acute URI  Acute otitis media, unspecified otitis media type  Fever, unspecified -     POC COVID-19 BinaxNow -     POCT Influenza A/B  Chills -     POC COVID-19 BinaxNow -     POCT Influenza A/B  Strep pharyngitis -     Azithromycin; Take 2 tablets on day 1, then 1 tablet daily on days 2 through 5  Dispense: 6 tablet; Refill:  0   Acute viral URI symptoms starting 1 day ago versus continuation of symptoms from AOM and strep pharyngitis.  Rapid COVID and flu testing negative in clinic.  Patient advised to retest for COVID at home in the next day or 2.  Will start azithromycin.  Continue supportive care.  If at home COVID testing positive patient to contact clinic for antiviral medication.  Continue ibuprofen for fever, pain, discomfort.  Given strict precautions.  Follow-up as needed  Billie Ruddy, MD

## 2023-03-14 ENCOUNTER — Telehealth: Payer: Self-pay | Admitting: Gastroenterology

## 2023-03-14 NOTE — Telephone Encounter (Signed)
Good afternoon Dr. Myrtie Neither,    We received a referral for patient for chronic constipation. He is requesting his care to be transferred over specifically to you because he believes that he will need banding as well. Has history with [facility] with Dr. Kinnie Scales, but he has retired. His records are available in [facility] for you to review and advise on scheduling.   Thank you.

## 2023-03-15 NOTE — Telephone Encounter (Signed)
Patient called and stated he did not want to wait for an appointment.

## 2023-03-15 NOTE — Telephone Encounter (Signed)
Next available new patient appointment.  - H. Danis

## 2023-03-15 NOTE — Telephone Encounter (Signed)
Called patient to schedule, left voicemail

## 2023-05-02 ENCOUNTER — Telehealth: Payer: Self-pay | Admitting: Family Medicine

## 2023-05-02 DIAGNOSIS — I10 Essential (primary) hypertension: Secondary | ICD-10-CM

## 2023-05-02 MED ORDER — AMLODIPINE BESYLATE 5 MG PO TABS: ORAL_TABLET | ORAL | 0 refills | Status: DC

## 2023-05-02 NOTE — Telephone Encounter (Signed)
Prescription Request  05/02/2023  LOV: 01/29/2023  What is the name of the medication or equipment? amLODipine (NORVASC) 5 MG tablet   Have you contacted your pharmacy to request a refill? Yes   Which pharmacy would you like this sent to?   [id number] - [place], [place] - [address] [place] [address] Phone: [phone] Fax: [phone]     Patient notified that their request is being sent to the clinical staff for review and that they should receive a response within 2 business days.   Please advise at Mobile [phone] (mobile)

## 2023-05-02 NOTE — Telephone Encounter (Signed)
Refill sent to requested pharmacy.

## 2023-05-16 ENCOUNTER — Ambulatory Visit: Payer: BC Managed Care – PPO | Admitting: Gastroenterology

## 2023-06-12 ENCOUNTER — Ambulatory Visit: Payer: BC Managed Care – PPO | Admitting: Nurse Practitioner

## 2023-06-12 ENCOUNTER — Encounter: Payer: Self-pay | Admitting: Nurse Practitioner

## 2023-06-12 VITALS — BP 112/76 | HR 100 | Ht 73.0 in | Wt 239.6 lb

## 2023-06-12 DIAGNOSIS — Z860100 Personal history of colon polyps, unspecified: Secondary | ICD-10-CM

## 2023-06-12 DIAGNOSIS — K648 Other hemorrhoids: Secondary | ICD-10-CM | POA: Diagnosis not present

## 2023-06-12 DIAGNOSIS — K644 Residual hemorrhoidal skin tags: Secondary | ICD-10-CM

## 2023-06-12 MED ORDER — HYDROCORTISONE ACETATE 25 MG RE SUPP: 25.0000 mg | Freq: Every day | RECTAL | 1 refills | Status: DC

## 2023-06-12 MED ORDER — NA SULFATE-K SULFATE-MG SULF 17.5-3.13-1.6 GM/177ML PO SOLN: 1.0000 | Freq: Once | ORAL | 0 refills | Status: AC

## 2023-06-12 NOTE — Patient Instructions (Addendum)
Take Miralax 1 capful mixed in 8 ounces of water at bed time for constipation as tolerated.  Benefiber 1 tablespoon daily   Apply a small amount of Desitin inside the anal opening and to the external anal area three times daily as needed for anal or hemorrhoidal irritation/bleeding.   You have been scheduled for a colonoscopy. Please follow written instructions given to you at your visit today.   Please pick up your prep supplies at the pharmacy within the next 1-3 days.  If you use inhalers (even only as needed), please bring them with you on the day of your procedure.  DO NOT TAKE 7 DAYS PRIOR TO TEST- Trulicity (dulaglutide) Ozempic, Wegovy (semaglutide) Mounjaro (tirzepatide) Bydureon Bcise (exanatide extended release)  DO NOT TAKE 1 DAY PRIOR TO YOUR TEST Rybelsus (semaglutide) Adlyxin (lixisenatide) Victoza (liraglutide) Byetta (exanatide) ___________________________________________________  Due to recent changes in healthcare laws, you may see the results of your imaging and laboratory studies on MyChart before your provider has had a chance to review them.  We understand that in some cases there may be results that are confusing or concerning to you. Not all laboratory results come back in the same time frame and the provider may be waiting for multiple results in order to interpret others.  Please give [place] 48 hours in order for your provider to thoroughly review all the results before contacting the office for clarification of your results.   Thank you for trusting me with your gastrointestinal care!   Alcide Evener, CRNP

## 2023-06-12 NOTE — Progress Notes (Signed)
06/12/2023 Luke Hunt [id number] 01-Nov-1971   CHIEF COMPLAINT: Constipation, hemorrhoids   HISTORY OF PRESENT ILLNESS: Luke Hunt is a 52 year old male with a past medical history of hyperlipidemia, hypertension, obesity, obstructive sleep apnea uses CPAP, gout, hepatic steatosis, alcohol use disorder, GERD, chronic constipation and a sessile serrated colon polyp.  He presents to our office today as referred by Dr. Abbe Amsterdam for further evaluation regarding chronic constipation. He was previously followed by gastroenterologist, Dr. Kinnie Scales.  He endorses having chronic constipation which did not improve with past trials of Linzess and Amitiza.  He develops nausea if he takes MiraLAX in the morning.  He is currently drinking a glass of prune juice daily and he takes 3 stool softeners and Benefiber 2 tablespoons daily with intermittent constipation and straining.  Approximately 2-1/2 months ago, he had hemorrhoidal swelling and pain and then thought he likely had a thrombosed hemorrhoid at that time.  He used Preparation H suppository for a few days and his symptoms improved.  He's undergone hemorrhoid banding x 2 by Dr. Kinnie Scales in the past.  His most recent colonoscopy was 04/08/2018 which identified one 9 mm semipedunculated sessile serrated polyp removed from the cecum and moderate internal hemorrhoids.  He was advised to repeat a colonoscopy in 5 years.  He has a history of GERD which is well-controlled on taking Prilosec 20 mg every other day.  His GERD symptoms significantly improved after he lost weight.  He is currently on Mounjaro for weight loss which he intends to discontinue in the next month or two. He aerobically exercises 7 days weekly.        Latest Ref Rng & Units 08/16/2022    8:58 AM 12/16/2020    8:56 AM 08/21/2019    8:25 AM  CBC  WBC 4.0 - 10.5 K/uL 6.1  4.8  5.9   Hemoglobin 13.0 - 17.0 g/dL 86.5  78.4  69.6   Hematocrit 39.0 - 52.0 % 47.1  38.8  40.1   Platelets  150.0 - 400.0 K/uL 199.0  209.0  213.0        Latest Ref Rng & Units 08/16/2022    8:58 AM 12/16/2020    8:56 AM 08/21/2019    8:25 AM  CMP  Glucose 70 - 99 mg/dL 295  284  132   BUN 6 - 23 mg/dL 28  12  18    Creatinine 0.40 - 1.50 mg/dL 4.40  1.02  7.25   Sodium 135 - 145 mEq/L 140  132  137   Potassium 3.5 - 5.1 mEq/L 4.4  4.5  4.5   Chloride 96 - 112 mEq/L 103  95  102   CO2 19 - 32 mEq/L 29  28  29    Calcium 8.4 - 10.5 mg/dL 9.6  9.4  9.5   Total Protein 6.0 - 8.3 g/dL 6.8  7.1  6.9   Total Bilirubin 0.2 - 1.2 mg/dL 0.6  1.3  0.7   Alkaline Phos 39 - 117 U/L 38  44  44   AST 0 - 37 U/L 22  21  20    ALT 0 - 53 U/L 20  19  21      RUQ 03/31/2016:  FINDINGS: Gallbladder: No gallstones or wall thickening visualized. No sonographic Murphy sign noted by sonographer.   Common bile duct: Diameter: 3.4 mm   Liver: Heterogeneous parenchymal pattern suggesting fatty infiltration and/or hepatocellular disease.   IVC: No abnormality visualized.  Pancreas: Visualized portion unremarkable.   Spleen: Size and appearance within normal limits.   Right Kidney: Length: 12.6 cm. Echogenicity within normal limits. No mass or hydronephrosis visualized.   Left Kidney: Length: 13.0 cm. Echogenicity within normal limits. No mass or hydronephrosis visualized.   Abdominal aorta: No aneurysm visualized.   Other findings: None.   IMPRESSION: Liver has a heterogeneous echotexture suggesting fatty infiltration and/or hepatocellular disease. Exam is otherwise unremarkable. No gallstones or biliary distention.  PAST GI PROCEDURES:  Colonoscopy 04/08/2018 by Dr. Kinnie Scales: 9 mm semipedunculated polyp removed from the cecum Moderate internal hemorrhoids Recall colonoscopy 5 years  FINAL PATHOLOGIC DIAGNOSIS  MICROSCOPIC EXAMINATION AND DIAGNOSIS   COLON, CECAL POLYP, BIOPSY:      Sessile serrated adenoma.   Past Medical History:  Diagnosis Date   Chronic constipation    intermittent    Environmental allergies    GERD    Gout    Hyperlipemia    Hypertension    Obesity    OSA (obstructive sleep apnea) 05/23/2012   on CPAP   Syncope    related to blood draws, stitches   Past Surgical History:  Procedure Laterality Date   APPENDECTOMY     Rigth knee arthroscopy     @ [facility]   VASECTOMY     10/2017-[facility]-per patient   Social History: He is divorced.  He has 1 son and 1 daughter.  He is a Lawyer.  He smoked cigarettes from the age of 66 to 27 years, quit smoking 17 years ago.  History of alcohol use disorder, previously drank three fifths of liquor weekly then 5 years ago reduced to drinking a total of 20 shots of liquor weekly. Past marijuana use, none since 09/2022.   Family History: family history includes Breast cancer in his mother; Cystic fibrosis in his maternal aunt; Diabetes in his brother, maternal grandfather, and mother; Heart disease in his maternal grandfather; Stroke in his maternal grandmother.  No known family history of esophageal, gastric or colon cancer.  No Known Allergies    Outpatient Encounter Medications as of 06/12/2023  Medication Sig   albuterol (VENTOLIN HFA) 108 (90 Base) MCG/ACT inhaler Inhale 1-2 puffs into the lungs every 6 (six) hours as needed for wheezing or shortness of breath. (Patient not taking: Reported on 01/19/2023)   amLODipine (NORVASC) 5 MG tablet TAKE 1 TABLET(5 MG) BY MOUTH DAILY   olmesartan (BENICAR) 40 MG tablet TAKE 1 TABLET(40 MG) BY MOUTH DAILY   Omega-3 Fatty Acids (FISH OIL PO) Take by mouth.   omeprazole (PRILOSEC OTC) 20 MG tablet Take 20 mg by mouth as needed.    VITAMIN D PO Take by mouth.   No facility-administered encounter medications on file as of 06/12/2023.    REVIEW OF SYSTEMS:  Gen: Denies fever, sweats or chills. No unexplained weight loss.  CV: Denies chest pain, palpitations or edema. Resp: Denies cough, shortness of breath of hemoptysis.  GI: See HPI. GU: Denies  urinary burning, blood in urine, increased urinary frequency or incontinence. MS: Denies joint pain, muscles aches or weakness. Derm: Denies rash, itchiness, skin lesions or unhealing ulcers. Psych: Denies depression, anxiety, memory loss or confusion. Heme: Denies bruising, easy bleeding. Neuro:  Denies headaches, dizziness or paresthesias. Endo:  Denies any problems with DM, thyroid or adrenal function.  PHYSICAL EXAM: BP 112/76   Pulse 100   Ht 6\' 1"  (1.854 m)   Wt 239 lb 9.6 oz (108.7 kg)   BMI 31.61  kg/m   General: 52 year old male in no acute distress. Head: Normocephalic and atraumatic. Eyes:  Sclerae non-icteric, conjunctive pink. Ears: Normal auditory acuity. Mouth: Dentition intact. No ulcers or lesions.  Neck: Supple, no lymphadenopathy or thyromegaly.  Lungs: Clear bilaterally to auscultation without wheezes, crackles or rhonchi. Heart: Regular rate and rhythm. No murmur, rub or gallop appreciated.  Abdomen: Soft, nondistended. Mild tenderness to the RLQ and LLQ without rebound or guarding.  Smooth liver border palpated with deep inspiration. No masses. No hepatosplenomegaly. Normoactive bowel sounds x 4 quadrants.  Rectal: Noninflamed external hemorrhoids.  Internal hemorrhoids without prolapse or active bleeding.  No stool in the rectal vault. Sheralyn Boatman CMA present during exam. Musculoskeletal: Symmetrical with no gross deformities. Skin: Warm and dry. No rash or lesions on visible extremities. Extremities: No edema. Neurological: Alert oriented x 4, no focal deficits.  Psychological:  Alert and cooperative. Normal mood and affect.  ASSESSMENT AND PLAN:  52 year old male with chronic constipation -Patient will try taking MiraLAX nightly, discontinue if not tolerated -Continue stool softener and Benefiber daily as tolerated  History of colon polyps.  Colonoscopy 04/08/2018 identified one 9 mm sessile serrated polyp removed from the cecum. -Colonoscopy benefits and risks  discussed including risk with sedation, risk of bleeding, perforation and infection  -Patient informed not to take [facility] for one week prior to colonoscopy date  Rectal bleeding, likely hemorrhoidal -Avoid constipation, see plan above -Anusol HC 25 mg suppository 1 PR nightly x 5 nights as needed Apply a small amount of Desitin inside the anal opening and to the external anal area three times daily as needed for anal or hemorrhoidal irritation/bleeding.  -Consider internal hemorrhoid banding after colonoscopy completed  Alcohol use disorder -Recommended continued decreased alcohol intake -Check LFTs annually with PCP at time of routine physical   CC:  Deeann Saint, MD

## 2023-06-14 NOTE — Progress Notes (Signed)
____________________________________________________________  Attending physician addendum:  Thank you for sending this case to me. I have reviewed the entire note and agree with the plan.  My review of Dr. Jennye Boroughs records actually shows that this patient had 6 sessions of hemorrhoidal banding. Given that, it seems that banding is probably not going to provide long-term control of his hemorrhoids, so I may very well refer him to colorectal surgery after his colonoscopy.  Amada Jupiter, MD  ____________________________________________________________

## 2023-07-24 ENCOUNTER — Other Ambulatory Visit: Payer: Self-pay | Admitting: Family Medicine

## 2023-07-24 DIAGNOSIS — I10 Essential (primary) hypertension: Secondary | ICD-10-CM

## 2023-07-26 ENCOUNTER — Ambulatory Visit (HOSPITAL_COMMUNITY)
Admission: EM | Admit: 2023-07-26 | Discharge: 2023-07-26 | Disposition: A | Discharge status: Home / Self Care | Payer: BC Managed Care – PPO | Attending: Emergency Medicine | Admitting: Emergency Medicine

## 2023-07-26 ENCOUNTER — Encounter (HOSPITAL_COMMUNITY): Payer: Self-pay | Admitting: Emergency Medicine

## 2023-07-26 DIAGNOSIS — J069 Acute upper respiratory infection, unspecified: Secondary | ICD-10-CM | POA: Diagnosis not present

## 2023-07-26 DIAGNOSIS — H6693 Otitis media, unspecified, bilateral: Secondary | ICD-10-CM

## 2023-07-26 DIAGNOSIS — J0101 Acute recurrent maxillary sinusitis: Secondary | ICD-10-CM | POA: Diagnosis not present

## 2023-07-26 MED ORDER — AMOXICILLIN-POT CLAVULANATE 875-125 MG PO TABS: 1.0000 | ORAL_TABLET | Freq: Two times a day (BID) | ORAL | 0 refills | Status: DC

## 2023-07-26 MED ORDER — IPRATROPIUM BROMIDE 0.03 % NA SOLN
2.0000 | Freq: Two times a day (BID) | NASAL | 12 refills | Status: DC
Start: 2023-07-26 — End: 2024-05-29

## 2023-07-26 MED ORDER — IBUPROFEN 600 MG PO TABS: 600.0000 mg | ORAL_TABLET | Freq: Four times a day (QID) | ORAL | 0 refills | Status: DC | PRN

## 2023-07-26 MED ORDER — GUAIFENESIN ER 600 MG PO TB12: 600.0000 mg | ORAL_TABLET | Freq: Two times a day (BID) | ORAL | 0 refills | Status: AC

## 2023-07-26 NOTE — ED Provider Notes (Signed)
[facility]    CSN: [id number] Arrival date & time: 07/26/23  1643      History   Chief Complaint Chief Complaint  Patient presents with   Cough   Nasal Congestion    HPI Luke Hunt is a 52 y.o. male.   Patient is presenting with upper respiratory infection symptoms.  Symptoms started Monday and has gradually gotten worse with increased facial pressure and headache today.  The history is provided by the patient.  Cough Cough characteristics:  Productive Sputum characteristics:  Clear Onset quality:  Gradual Duration:  4 days Progression:  Worsening Chronicity:  New Associated symptoms: chills, ear fullness, ear pain, headaches, myalgias, rhinorrhea, sinus congestion and sore throat   Ear pain:    Location:  Bilateral (right is worse than left) Headaches:    Severity:  Moderate   Onset quality:  Gradual   Duration:  3 days   Timing:  Constant   Progression:  Worsening Myalgias:    Location:  Generalized Rhinorrhea:    Quality:  Clear Risk factors: recent infection     Past Medical History:  Diagnosis Date   Chronic constipation    intermittent   Environmental allergies    GERD    Gout    Hyperlipemia    Hypertension    Obesity    OSA (obstructive sleep apnea) 05/23/2012   on CPAP   Syncope    related to blood draws, stitches    Patient Active Problem List   Diagnosis Date Noted   History of COVID-19 03/11/2020   Shortness of breath 02/18/2020   Cough 02/18/2020   Wheeze 02/18/2020   COVID-19 02/18/2020   Prediabetes 08/25/2019   Constipation 03/20/2016   Alcohol abuse 03/20/2016   OSA (obstructive sleep apnea) 05/23/2012   ALLERGIC RHINITIS 01/09/2008   Obesity 07/05/2007   GERD 07/05/2007   Hyperlipemia 07/04/2007   Essential hypertension 07/04/2007    Past Surgical History:  Procedure Laterality Date   APPENDECTOMY     Rigth knee arthroscopy     @ [facility]   VASECTOMY     10/2017-[facility]-per patient        Home Medications    Prior to Admission medications   Medication Sig Start Date End Date Taking? Authorizing Provider  amoxicillin-clavulanate (AUGMENTIN) 875-125 MG tablet Take 1 tablet by mouth every 12 (twelve) hours. 07/26/23  Yes Amariss Detamore, Linde Gillis, NP  guaiFENesin (MUCINEX) 600 MG 12 hr tablet Take 1 tablet (600 mg total) by mouth 2 (two) times daily for 10 days. 07/26/23 08/05/23 Yes Sandip Power, Linde Gillis, NP  ibuprofen (ADVIL) 600 MG tablet Take 1 tablet (600 mg total) by mouth every 6 (six) hours as needed. Take with food 07/26/23  Yes Ramsie Ostrander, Linde Gillis, NP  ipratropium (ATROVENT) 0.03 % nasal spray Place 2 sprays into both nostrils every 12 (twelve) hours. 07/26/23  Yes Arayla Kruschke, Linde Gillis, NP  amLODipine (NORVASC) 5 MG tablet TAKE 1 TABLET(5 MG) BY MOUTH DAILY 07/24/23   Deeann Saint, MD  hydrocortisone (ANUSOL-HC) 25 MG suppository Place 1 suppository (25 mg total) rectally at bedtime. 06/12/23   Arnaldo Natal, NP  olmesartan (BENICAR) 40 MG tablet TAKE 1 TABLET(40 MG) BY MOUTH DAILY 01/30/23   Deeann Saint, MD  Omega-3 Fatty Acids (FISH OIL PO) Take by mouth.    [provider]  omeprazole (PRILOSEC OTC) 20 MG tablet Take 20 mg by mouth as needed.     [provider]  VITAMIN D  PO Take by mouth.    [provider]    Family History Family History  Problem Relation Age of Onset   Stroke Maternal Grandmother    Diabetes Mother    Breast cancer Mother    Heart disease Maternal Grandfather    Diabetes Maternal Grandfather    Cystic fibrosis Maternal Aunt    Diabetes Brother     Social History Social History   Tobacco Use   Smoking status: Former    Current packs/day: 0.00    Types: Cigarettes    Start date: 02/16/2000    Quit date: 02/15/2006    Years since quitting: 17.4   Smokeless tobacco: Never  Vaping Use   Vaping status: Never Used  Substance Use Topics   Alcohol use: Yes    Alcohol/week: 14.0 standard drinks of alcohol     Types: 14 Standard drinks or equivalent per week    Comment: 2 per day   Drug use: No     Allergies   Patient has no known allergies.   Review of Systems Review of Systems  Constitutional:  Positive for chills and fatigue.  HENT:  Positive for congestion, ear pain, rhinorrhea and sore throat.   Respiratory:  Positive for cough.   Musculoskeletal:  Positive for myalgias.  Neurological:  Positive for headaches.       Sinus related  All other systems reviewed and are negative.    Physical Exam Triage Vital Signs ED Triage Vitals  Encounter Vitals Group     BP      Systolic BP Percentile      Diastolic BP Percentile      Pulse      Resp      Temp      Temp src      SpO2      Weight      Height      Head Circumference      Peak Flow      Pain Score      Pain Loc      Pain Education      Exclude from Growth Chart    No data found.  Updated Vital Signs BP 130/85 (BP Location: Right Arm)   Pulse 73   Temp 97.9 F (36.6 C) (Oral)   Resp 16   SpO2 96%   Visual Acuity Right Eye Distance:   Left Eye Distance:   Bilateral Distance:    Right Eye Near:   Left Eye Near:    Bilateral Near:     Physical Exam Vitals and nursing note reviewed.  HENT:     Head: Normocephalic.     Right Ear: Ear canal normal. A middle ear effusion is present. Tympanic membrane is erythematous and bulging.     Left Ear: Ear canal normal. A middle ear effusion is present. Tympanic membrane is bulging.     Ears:     Comments: Purulent fluid noted bilateral ears.    Nose: Congestion and rhinorrhea present.     Mouth/Throat:     Mouth: Mucous membranes are moist.     Pharynx: Posterior oropharyngeal erythema present.  Cardiovascular:     Rate and Rhythm: Normal rate and regular rhythm.     Heart sounds: Normal heart sounds.  Pulmonary:     Effort: Pulmonary effort is normal.     Breath sounds: Normal breath sounds.  Lymphadenopathy:     Head:     Right side of head: Tonsillar  adenopathy  present.     Left side of head: Submandibular and tonsillar adenopathy present.     Comments: Enlarged lymphnodes  Neurological:     Mental Status: He is alert.      UC Treatments / Results  Labs (all labs ordered are listed, but only abnormal results are displayed) Labs Reviewed - No data to display  EKG   Radiology No results found.  Procedures Procedures (including critical care time)  Medications Ordered in UC Medications - No data to display  Initial Impression / Assessment and Plan / UC Course  I have reviewed the triage vital signs and the nursing notes.  Pertinent labs & imaging results that were available during my care of the patient were reviewed by me and considered in my medical decision making (see chart for details).   He is reporting upper respiratory illness x 4 days, he has been treating symptoms with daycare and Sudafed and Motrin.   patient did test negative at home for COVID.  Patient is presenting with signs and symptoms of bacterial sinusitis and bilateral otitis media..  On exam it is noted that he has purulent fluid with bulging tympanic membranes bilateral ears.  Throat is red with tonsillar swelling +3.  Also noted is uvular swelling.  We will treat for bilateral otitis media and sinusitis Augmentin x 7 days  Final Clinical Impressions(s) / UC Diagnoses   Final diagnoses:  Acute recurrent maxillary sinusitis  Acute bacterial otitis media, bilateral  Viral upper respiratory tract infection     Discharge Instructions      Take antibiotics as ordered until completed. Continue Sudafed long-acting 1 tablet twice a day Add on Mucinex 600 mg twice daily. Add on Zrytec daily  nasal spray for congestion Add on Zinc for immunity, Hot tea with lemon and honey, Warm mist inhaled     ED Prescriptions     Medication Sig Dispense Auth. Provider   guaiFENesin (MUCINEX) 600 MG 12 hr tablet Take 1 tablet (600 mg total) by mouth 2 (two)  times daily for 10 days. 20 tablet Talton Delpriore, Linde Gillis, NP   amoxicillin-clavulanate (AUGMENTIN) 875-125 MG tablet Take 1 tablet by mouth every 12 (twelve) hours. 14 tablet Akia Montalban, Linde Gillis, NP   ibuprofen (ADVIL) 600 MG tablet Take 1 tablet (600 mg total) by mouth every 6 (six) hours as needed. Take with food 30 tablet Ariyel Jeangilles M, NP   ipratropium (ATROVENT) 0.03 % nasal spray Place 2 sprays into both nostrils every 12 (twelve) hours. 30 mL Deondrea Markos, Linde Gillis, NP      PDMP not reviewed this encounter.   Nelda Marseille, NP 07/26/23 [phone]

## 2023-07-26 NOTE — ED Triage Notes (Signed)
Pt presents with sore throat, congestion, cough, and runny nose that started Tuesday. Pt took at home Covid test yesterday and was negative.  Pt has taken Sudafed, Day quail, vit C today.

## 2023-07-26 NOTE — Discharge Instructions (Addendum)
Take antibiotics as ordered until completed. Continue Sudafed long-acting 1 tablet twice a day Add on Mucinex 600 mg twice daily. Add on Zrytec daily  nasal spray for congestion Add on Zinc for immunity, Hot tea with lemon and honey, Warm mist inhaled

## 2023-07-31 ENCOUNTER — Telehealth: Payer: Self-pay | Admitting: Gastroenterology

## 2023-07-31 NOTE — Telephone Encounter (Signed)
PT is calling to discuss coding for his colonoscopy 10/15. He is trying to find out if he will have to pay anything towards it. Please advise.

## 2023-08-02 ENCOUNTER — Ambulatory Visit: Payer: BC Managed Care – PPO | Admitting: Family Medicine

## 2023-08-17 ENCOUNTER — Other Ambulatory Visit: Payer: Self-pay | Admitting: Nurse Practitioner

## 2023-08-18 ENCOUNTER — Other Ambulatory Visit: Payer: Self-pay | Admitting: Family Medicine

## 2023-08-18 DIAGNOSIS — I10 Essential (primary) hypertension: Secondary | ICD-10-CM

## 2023-08-21 ENCOUNTER — Encounter: Payer: BC Managed Care – PPO | Admitting: Gastroenterology

## 2023-08-22 ENCOUNTER — Encounter: Payer: BC Managed Care – PPO | Admitting: Family Medicine

## 2023-09-20 ENCOUNTER — Encounter: Payer: Self-pay | Admitting: Family Medicine

## 2023-09-20 ENCOUNTER — Ambulatory Visit: Payer: BC Managed Care – PPO | Admitting: Family Medicine

## 2023-09-20 VITALS — BP 114/80 | HR 96 | Temp 98.9°F | Ht 73.0 in | Wt 244.6 lb

## 2023-09-20 DIAGNOSIS — K5909 Other constipation: Secondary | ICD-10-CM

## 2023-09-20 DIAGNOSIS — R1084 Generalized abdominal pain: Secondary | ICD-10-CM | POA: Diagnosis not present

## 2023-09-20 DIAGNOSIS — Z113 Encounter for screening for infections with a predominantly sexual mode of transmission: Secondary | ICD-10-CM | POA: Diagnosis not present

## 2023-09-20 DIAGNOSIS — Z0001 Encounter for general adult medical examination with abnormal findings: Secondary | ICD-10-CM | POA: Diagnosis not present

## 2023-09-20 DIAGNOSIS — E782 Mixed hyperlipidemia: Secondary | ICD-10-CM | POA: Diagnosis not present

## 2023-09-20 DIAGNOSIS — J029 Acute pharyngitis, unspecified: Secondary | ICD-10-CM

## 2023-09-20 DIAGNOSIS — R051 Acute cough: Secondary | ICD-10-CM

## 2023-09-20 DIAGNOSIS — Z125 Encounter for screening for malignant neoplasm of prostate: Secondary | ICD-10-CM | POA: Diagnosis not present

## 2023-09-20 DIAGNOSIS — I10 Essential (primary) hypertension: Secondary | ICD-10-CM | POA: Diagnosis not present

## 2023-09-20 DIAGNOSIS — J0140 Acute pansinusitis, unspecified: Secondary | ICD-10-CM

## 2023-09-20 LAB — LIPID PANEL
Cholesterol: 249 mg/dL — ABNORMAL HIGH (ref 0–200)
HDL: 57.7 mg/dL (ref >39.00)
LDL Cholesterol: 169 mg/dL — ABNORMAL HIGH (ref 0–99)
NonHDL: 191.79
Total CHOL/HDL Ratio: 4
Triglycerides: 115 mg/dL (ref 0.0–149.0)
VLDL: 23 mg/dL (ref 0.0–40.0)

## 2023-09-20 LAB — COMPREHENSIVE METABOLIC PANEL
ALT: 34 U/L (ref 0–53)
AST: 35 U/L (ref 0–37)
Albumin: 4.7 g/dL (ref 3.5–5.2)
Alkaline Phosphatase: 51 U/L (ref 39–117)
BUN: 23 mg/dL (ref 6–23)
CO2: 32 meq/L (ref 19–32)
Calcium: 10.5 mg/dL (ref 8.4–10.5)
Chloride: 99 meq/L (ref 96–112)
Creatinine, Ser: 1.32 mg/dL (ref 0.40–1.50)
GFR: 62.37 mL/min (ref >60.00)
Glucose, Bld: 95 mg/dL (ref 70–99)
Potassium: 4.5 meq/L (ref 3.5–5.1)
Sodium: 137 meq/L (ref 135–145)
Total Bilirubin: 1 mg/dL (ref 0.2–1.2)
Total Protein: 7.8 g/dL (ref 6.0–8.3)

## 2023-09-20 LAB — CBC WITH DIFFERENTIAL/PLATELET
Basophils Absolute: 0 10*3/uL (ref 0.0–0.1)
Basophils Relative: 0.4 % (ref 0.0–3.0)
Eosinophils Absolute: 0.3 10*3/uL (ref 0.0–0.7)
Eosinophils Relative: 3.3 % (ref 0.0–5.0)
HCT: 47.7 % (ref 39.0–52.0)
Hemoglobin: 15.9 g/dL (ref 13.0–17.0)
Lymphocytes Relative: 21.6 % (ref 12.0–46.0)
Lymphs Abs: 1.7 10*3/uL (ref 0.7–4.0)
MCHC: 33.4 g/dL (ref 30.0–36.0)
MCV: 93.5 fL (ref 78.0–100.0)
Monocytes Absolute: 0.6 10*3/uL (ref 0.1–1.0)
Monocytes Relative: 7.8 % (ref 3.0–12.0)
Neutro Abs: 5.3 10*3/uL (ref 1.4–7.7)
Neutrophils Relative %: 66.9 % (ref 43.0–77.0)
Platelets: 226 10*3/uL (ref 150.0–400.0)
RBC: 5.1 Mil/uL (ref 4.22–5.81)
RDW: 14 % (ref 11.5–15.5)
WBC: 8 10*3/uL (ref 4.0–10.5)

## 2023-09-20 LAB — HEMOGLOBIN A1C: Hgb A1c MFr Bld: 5.7 % (ref 4.6–6.5)

## 2023-09-20 LAB — T4, FREE: Free T4: 0.74 ng/dL (ref 0.60–1.60)

## 2023-09-20 LAB — TSH: TSH: 2.18 u[IU]/mL (ref 0.35–5.50)

## 2023-09-20 LAB — PSA: PSA: 0.79 ng/mL (ref 0.10–4.00)

## 2023-09-20 LAB — POCT RAPID STREP A (OFFICE): Rapid Strep A Screen: NEGATIVE

## 2023-09-20 LAB — POC COVID19 BINAXNOW: SARS Coronavirus 2 Ag: NEGATIVE

## 2023-09-20 MED ORDER — AMLODIPINE BESYLATE 5 MG PO TABS
ORAL_TABLET | ORAL | 3 refills | Status: DC
Start: 2023-09-20 — End: 2024-09-29

## 2023-09-20 MED ORDER — AZITHROMYCIN 250 MG PO TABS
ORAL_TABLET | ORAL | 0 refills | Status: AC
Start: 2023-09-20 — End: 2023-09-25

## 2023-09-20 NOTE — Patient Instructions (Signed)
A prescription for azithromycin was sent to the pharmacy for sinus infection.  Follow the instructions on the pack.  Take 2 tabs now,  then 1 tab daily starting tomorrow.  You can continue using the over-the-counter cough and cold medications as well as nose spray as needed.

## 2023-09-20 NOTE — Progress Notes (Signed)
Established Patient Office Visit   Subjective  Patient ID: Luke Hunt, male    DOB: 1971/08/29  Age: 52 y.o. MRN: [id number]  Chief Complaint  Patient presents with   Annual Exam   Cough    Started a week ago, runny nose, cough, congestion, mucus,     Patient is a 52 year old male seen for CPE and acute illness.  Patient states he was doing well until a week ago.  States started feeling sick after a trip to trade show in [place].  Patient endorses cough, chills, rhinorrhea, nasal and chest congestion, sore throat, facial pressure, and headache.  Patient tried Mucinex, Sudafed, and ipratropium.  Nasal spray for symptoms.  States feels worse".  Still able to exercise at the gym despite symptoms.  Denies fever, nausea, vomiting, loose stools, ear pain.  Pt mentions he stopped drinking EtOH and energy drinks a few wks ago.  Prior to getting sick was feeling the best he has in a while.  Still having abdominal pain and chronic constipation.  Had appointment with [facility] however canceled stating and to get diagnostic colonoscopy which would be $4000.  Colonoscopy 04/25/2018 with polyp removed.  3-5-year recall suggested.  Patient taking Benefiber 3 times daily and drinking prune juice daily.  In the past MiraLAX caused increased bloating and abdominal pain.  Patient has an appointment with GI at [facility] scheduled.  Patient states BP has been well-controlled since additional BP meds added to regimen.  Currently taking olmesartan 40 mg daily and Norvasc 5 mg daily.    Patient Active Problem List   Diagnosis Date Noted   History of COVID-19 03/11/2020   Shortness of breath 02/18/2020   Cough 02/18/2020   Wheeze 02/18/2020   COVID-19 02/18/2020   Prediabetes 08/25/2019   Constipation 03/20/2016   Alcohol abuse 03/20/2016   OSA (obstructive sleep apnea) 05/23/2012   ALLERGIC RHINITIS 01/09/2008   Obesity 07/05/2007   GERD 07/05/2007   Hyperlipemia 07/04/2007   Essential hypertension  07/04/2007   Past Medical History:  Diagnosis Date   Chronic constipation    intermittent   Environmental allergies    GERD    Gout    Hyperlipemia    Hypertension    Obesity    OSA (obstructive sleep apnea) 05/23/2012   on CPAP   Syncope    related to blood draws, stitches   Past Surgical History:  Procedure Laterality Date   APPENDECTOMY     Rigth knee arthroscopy     @ [facility]   VASECTOMY     10/2017-[facility]-per patient   Social History   Tobacco Use   Smoking status: Former    Current packs/day: 0.00    Types: Cigarettes    Start date: 02/16/2000    Quit date: 02/15/2006    Years since quitting: 17.6   Smokeless tobacco: Never  Vaping Use   Vaping status: Never Used  Substance Use Topics   Alcohol use: Yes    Alcohol/week: 14.0 standard drinks of alcohol    Types: 14 Standard drinks or equivalent per week    Comment: 2 per day   Drug use: No   Family History  Problem Relation Age of Onset   Stroke Maternal Grandmother    Diabetes Mother    Breast cancer Mother    Heart disease Maternal Grandfather    Diabetes Maternal Grandfather    Cystic fibrosis Maternal Aunt    Diabetes Brother    No Known Allergies  ROS Negative unless stated above    Objective:     BP 114/80 (BP Location: Left Arm, Patient Position: Sitting, Cuff Size: Large)   Pulse 96   Temp 98.9 F (37.2 C) (Oral)   Ht 6\' 1"  (1.854 m)   Wt 244 lb 9.6 oz (110.9 kg)   SpO2 96%   BMI 32.27 kg/m  BP Readings from Last 3 Encounters:  09/20/23 114/80  07/26/23 130/85  06/12/23 112/76   Wt Readings from Last 3 Encounters:  09/20/23 244 lb 9.6 oz (110.9 kg)  06/12/23 239 lb 9.6 oz (108.7 kg)  01/29/23 252 lb 9.6 oz (114.6 kg)      Physical Exam Constitutional:      Appearance: Normal appearance.  HENT:     Head: Normocephalic and atraumatic.     Right Ear: Tympanic membrane, ear canal and external ear normal.     Left Ear: Tympanic membrane, ear canal and  external ear normal.     Nose: Mucosal edema and congestion present.     Right Sinus: Maxillary sinus tenderness and frontal sinus tenderness present.     Left Sinus: Maxillary sinus tenderness and frontal sinus tenderness present.     Mouth/Throat:     Mouth: Mucous membranes are moist.     Pharynx: Posterior oropharyngeal erythema present. No oropharyngeal exudate.  Eyes:     General: No scleral icterus.    Extraocular Movements: Extraocular movements intact.     Conjunctiva/sclera: Conjunctivae normal.     Pupils: Pupils are equal, round, and reactive to light.  Neck:     Thyroid: No thyromegaly.  Cardiovascular:     Rate and Rhythm: Normal rate and regular rhythm.     Pulses: Normal pulses.     Heart sounds: Normal heart sounds. No murmur heard.    No friction rub.  Pulmonary:     Effort: Pulmonary effort is normal.     Breath sounds: Normal breath sounds. No wheezing, rhonchi or rales.  Abdominal:     General: Bowel sounds are normal.     Palpations: Abdomen is soft.     Tenderness: There is no abdominal tenderness.  Musculoskeletal:        General: No deformity. Normal range of motion.  Lymphadenopathy:     Cervical: No cervical adenopathy.  Skin:    General: Skin is warm and dry.     Findings: No lesion.  Neurological:     General: No focal deficit present.     Mental Status: He is alert and oriented to person, place, and time. Mental status is at baseline.  Psychiatric:        Mood and Affect: Mood normal.        Thought Content: Thought content normal.      Results for orders placed or performed in visit on 09/20/23  POC Rapid Strep A  Result Value Ref Range   Rapid Strep A Screen Negative Negative  POC COVID-19  Result Value Ref Range   SARS Coronavirus 2 Ag Negative Negative      Assessment & Plan:  Encounter for routine adult health examination with abnormal findings -Age-appropriate health screenings discussed -Will obtain labs -Last colonoscopy  6//2019.  3-5-year recall advised.  Discussed at upcoming GI appointment. -Immunizations reviewed.  Patient declines influenza vaccine at this time. -Next CPE in 1 year -     Hemoglobin A1c -     Lipid panel  Acute cough -Likely viral in etiology -COVID testing negative. -Continue supportive  care with OTC cough/cold medications -     POC COVID-19 BinaxNow  Essential hypertension -Controlled -Continue current medications including Norvasc 5 mg daily and olmesartan 40 mg daily -Continue lifestyle modifications -     TSH -     T4, free -     amLODIPine Besylate; TAKE 1 TABLET(5 MG) BY MOUTH DAILY  Dispense: 90 tablet; Refill: 3  Routine screening for STI (sexually transmitted infection) -     RPR -     HIV Antibody (routine testing w rflx) -     C. trachomatis/N. gonorrhoeae RNA  Screening for prostate cancer -     PSA  Sore throat -Rapid strep testing negative in clinic -Continue supportive care including gargling with warm salt water Chloraseptic spray, Tylenol prn, etc. -     POCT rapid strep A  Generalized abdominal pain -Ongoing concern.  Discussed possible causes including chronic constipation, gallbladder/hepatic etiology, IBS, etc. -Food diary -Continue daily bowel regimen -Follow-up with GI scheduled -     Comprehensive metabolic panel -     CBC with Differential/Platelet  Chronic constipation -Continue daily bowel regimen -     Comprehensive metabolic panel -     TSH -     T4, free  Acute pansinusitis, recurrence not specified -     Azithromycin; Take 2 tablets on day 1, then 1 tablet daily on days 2 through 5  Dispense: 6 tablet; Refill: 0  Mixed hyperlipidemia -Lifestyle modifications -     Lipid panel  Return if symptoms worsen or fail to improve.  Next CPE in 1 year  Deeann Saint, MD

## 2023-09-21 LAB — C. TRACHOMATIS/N. GONORRHOEAE RNA
C. trachomatis RNA, TMA: NOT DETECTED
N. gonorrhoeae RNA, TMA: NOT DETECTED

## 2023-09-21 LAB — RPR: RPR Ser Ql: NONREACTIVE

## 2023-09-21 LAB — HIV ANTIBODY (ROUTINE TESTING W REFLEX): HIV 1&2 Ab, 4th Generation: NONREACTIVE

## 2023-10-15 DIAGNOSIS — K5904 Chronic idiopathic constipation: Secondary | ICD-10-CM | POA: Diagnosis not present

## 2023-10-15 DIAGNOSIS — K648 Other hemorrhoids: Secondary | ICD-10-CM | POA: Diagnosis not present

## 2023-10-15 DIAGNOSIS — Z860101 Personal history of adenomatous and serrated colon polyps: Secondary | ICD-10-CM | POA: Diagnosis not present

## 2023-10-22 ENCOUNTER — Encounter (HOSPITAL_COMMUNITY): Payer: Self-pay | Admitting: Emergency Medicine

## 2023-10-22 ENCOUNTER — Ambulatory Visit (HOSPITAL_COMMUNITY)
Admission: EM | Admit: 2023-10-22 | Discharge: 2023-10-22 | Disposition: A | Discharge status: Home / Self Care | Payer: BC Managed Care – PPO | Attending: Emergency Medicine | Admitting: Emergency Medicine

## 2023-10-22 DIAGNOSIS — J019 Acute sinusitis, unspecified: Secondary | ICD-10-CM

## 2023-10-22 DIAGNOSIS — B9689 Other specified bacterial agents as the cause of diseases classified elsewhere: Secondary | ICD-10-CM | POA: Diagnosis not present

## 2023-10-22 MED ORDER — AMOXICILLIN-POT CLAVULANATE 875-125 MG PO TABS: 1.0000 | ORAL_TABLET | Freq: Two times a day (BID) | ORAL | 0 refills | Status: DC

## 2023-10-22 NOTE — Discharge Instructions (Signed)
Take the antibiotics twice daily with food to prevent gastrointestinal upset and take them until finished.  Continue with over-the-counter nasal spray.  You can take 1200 mg of Mucinex daily to help loosen secretions in addition to at least 64 ounces of water.  Sleeping with a humidifier may help as well.  I suggest daily saline nasal rinses with filtered water.  This seems to be a recurrent issue, if this happens again I suggest following up with the ENT for further evaluation.

## 2023-10-22 NOTE — ED Provider Notes (Signed)
[facility]    CSN: [id number] Arrival date & time: 10/22/23  1602      History   Chief Complaint Chief Complaint  Patient presents with   Cough    HPI Luke Hunt is a 52 y.o. male.   Patient presents to clinic for complaints of sinus pain, sinus pressure, congestion and feeling unwell.  Symptoms initially started last Wednesday with a sore throat.  Denies having nasal congestion and ear fullness.  He has not had any fevers.  Cough is nonproductive.  He has been taking the nasal spray that was prescribed at last visit.  He has been taking Mucinex and Sudafed.  He does not have any wheezing or shortness of breath.  Endorses frontal and maxillary sinus pressure and sinus headache.  Pain with bending forward.  Clear nasal drainage.    The history is provided by the patient and medical records.  Cough   Past Medical History:  Diagnosis Date   Chronic constipation    intermittent   Environmental allergies    GERD    Gout    Hyperlipemia    Hypertension    Obesity    OSA (obstructive sleep apnea) 05/23/2012   on CPAP   Syncope    related to blood draws, stitches    Patient Active Problem List   Diagnosis Date Noted   History of COVID-19 03/11/2020   Shortness of breath 02/18/2020   Cough 02/18/2020   Wheeze 02/18/2020   COVID-19 02/18/2020   Prediabetes 08/25/2019   Constipation 03/20/2016   Alcohol abuse 03/20/2016   OSA (obstructive sleep apnea) 05/23/2012   ALLERGIC RHINITIS 01/09/2008   Obesity 07/05/2007   GERD 07/05/2007   Hyperlipemia 07/04/2007   Essential hypertension 07/04/2007    Past Surgical History:  Procedure Laterality Date   APPENDECTOMY     Rigth knee arthroscopy     @ [facility] [facility]   VASECTOMY     10/2017-[facility]-per patient       Home Medications    Prior to Admission medications   Medication Sig Start Date End Date Taking? Authorizing Provider  amoxicillin-clavulanate (AUGMENTIN) 875-125 MG tablet Take  1 tablet by mouth every 12 (twelve) hours. 10/22/23  Yes Rinaldo Ratel, [place] N, FNP  amLODipine (NORVASC) 5 MG tablet TAKE 1 TABLET(5 MG) BY MOUTH DAILY 09/20/23   Deeann Saint, MD  ANUCORT-HC 25 MG suppository UNWRAP AND INSERT 1 SUPPOSITORY(25 MG) RECTALLY AT BEDTIME 08/19/23   Arnaldo Natal, NP  ascorbic acid (VITAMIN C) 500 MG tablet Take 500 mg by mouth daily.    [provider]  cholecalciferol (VITAMIN D3) 25 MCG (1000 UNIT) tablet Take 1,000 Units by mouth daily.    [provider]  ibuprofen (ADVIL) 600 MG tablet Take 1 tablet (600 mg total) by mouth every 6 (six) hours as needed. Take with food 07/26/23   Blitch, Linde Gillis, NP  ipratropium (ATROVENT) 0.03 % nasal spray Place 2 sprays into both nostrils every 12 (twelve) hours. 07/26/23   Blitch, Linde Gillis, NP  magnesium (MAGTAB) 84 MG ( ) TBCR SR tablet Take 84 mg by mouth.    [provider]  Na Sulfate-K Sulfate-Mg Sulf 17.5-3.13-1.6 GM/177ML SOLN SMARTSIG:1 Kit(s) By Mouth Once 06/12/23   [provider]  olmesartan (BENICAR) 40 MG tablet TAKE 1 TABLET(40 MG) BY MOUTH DAILY 01/30/23   Deeann Saint, MD  Omega-3 Fatty Acids (FISH OIL PO) Take by mouth.    [provider]  omeprazole (PRILOSEC  OTC) 20 MG tablet Take 20 mg by mouth as needed.     [provider]  phentermine (ADIPEX-P) 37.5 MG tablet Take by mouth. 06/11/21   [provider]  potassium & sodium citrate-citric acid (TRICITRATES) 550-500-334 MG/5ML SOLN Take by mouth 4 (four) times daily -  with meals and at bedtime.    [provider]  tadalafil (CIALIS) 20 MG tablet TAKE 1 TABLET 60 MINUTES PRIOR TO SEXUAL ACTIVITY. DO NOT TAKE MORE THAN 1 TABLET IN ANY 24 HOUR PERIOD.    [provider]  VITAMIN D PO Take by mouth.    [provider]  zinc sulfate, 50mg  elemental zinc, 220 (50 Zn) MG capsule Take 220 mg by mouth daily.    [provider]    Family History Family  History  Problem Relation Age of Onset   Stroke Maternal Grandmother    Diabetes Mother    Breast cancer Mother    Heart disease Maternal Grandfather    Diabetes Maternal Grandfather    Cystic fibrosis Maternal Aunt    Diabetes Brother     Social History Social History   Tobacco Use   Smoking status: Former    Current packs/day: 0.00    Types: Cigarettes    Start date: 02/16/2000    Quit date: 02/15/2006    Years since quitting: 17.6   Smokeless tobacco: Never  Vaping Use   Vaping status: Never Used  Substance Use Topics   Alcohol use: Yes    Alcohol/week: 14.0 standard drinks of alcohol    Types: 14 Standard drinks or equivalent per week    Comment: 2 per day   Drug use: No     Allergies   Patient has no known allergies.   Review of Systems Review of Systems  Per HPI   Physical Exam Triage Vital Signs ED Triage Vitals  Encounter Vitals Group     BP 10/22/23 1654 129/76     Systolic BP Percentile --      Diastolic BP Percentile --      Pulse Rate 10/22/23 1654 77     Resp 10/22/23 1654 16     Temp 10/22/23 1654 98.2 F (36.8 C)     Temp Source 10/22/23 1654 Oral     SpO2 10/22/23 1654 95 %     Weight --      Height --      Head Circumference --      Peak Flow --      Pain Score 10/22/23 1653 6     Pain Loc --      Pain Education --      Exclude from Growth Chart --    No data found.  Updated Vital Signs BP 129/76 (BP Location: Left Arm)   Pulse 77   Temp 98.2 F (36.8 C) (Oral)   Resp 16   SpO2 95%   Visual Acuity Right Eye Distance:   Left Eye Distance:   Bilateral Distance:    Right Eye Near:   Left Eye Near:    Bilateral Near:     Physical Exam Vitals and nursing note reviewed.  Constitutional:      Appearance: Normal appearance.  HENT:     Head: Normocephalic and atraumatic.     Right Ear: External ear normal.     Left Ear: External ear normal.     Nose: Congestion and rhinorrhea present.     Right Sinus: Maxillary sinus  tenderness and frontal  sinus tenderness present.     Left Sinus: Maxillary sinus tenderness and frontal sinus tenderness present.     Mouth/Throat:     Mouth: Mucous membranes are moist.  Eyes:     Conjunctiva/sclera: Conjunctivae normal.  Cardiovascular:     Rate and Rhythm: Normal rate and regular rhythm.     Heart sounds: Normal heart sounds. No murmur heard. Pulmonary:     Effort: Pulmonary effort is normal. No respiratory distress.     Breath sounds: Normal breath sounds.  Musculoskeletal:        General: Normal range of motion.  Skin:    General: Skin is warm and dry.  Neurological:     General: No focal deficit present.     Mental Status: He is alert and oriented to person, place, and time.  Psychiatric:        Mood and Affect: Mood normal.        Behavior: Behavior normal. Behavior is cooperative.      UC Treatments / Results  Labs (all labs ordered are listed, but only abnormal results are displayed) Labs Reviewed - No data to display  EKG   Radiology No results found.  Procedures Procedures (including critical care time)  Medications Ordered in UC Medications - No data to display  Initial Impression / Assessment and Plan / UC Course  I have reviewed the triage vital signs and the nursing notes.  Pertinent labs & imaging results that were available during my care of the patient were reviewed by me and considered in my medical decision making (see chart for details).  Vitals and triage reviewed, patient is hemodynamically stable.  Lungs are vesicular, heart with regular rate and rhythm.  Diffuse frontal and maxillary sinus tenderness to palpation, pain increases with bending forward, sinus headache present on exam.  Congestion.  Will cover for acute bacterial sinusitis with Augmentin.  This appears to be recurrent, encouraged ENT follow-up for further evaluation.  Symptomatic management with saline rinses and nasal sprays.  Plan of care, follow-up care and  return precautions given, no questions at this time.     Final Clinical Impressions(s) / UC Diagnoses   Final diagnoses:  Acute bacterial sinusitis     Discharge Instructions      Take the antibiotics twice daily with food to prevent gastrointestinal upset and take them until finished.  Continue with over-the-counter nasal spray.  You can take 1200 mg of Mucinex daily to help loosen secretions in addition to at least 64 ounces of water.  Sleeping with a humidifier may help as well.  I suggest daily saline nasal rinses with filtered water.  This seems to be a recurrent issue, if this happens again I suggest following up with the ENT for further evaluation.     ED Prescriptions     Medication Sig Dispense Auth. Provider   amoxicillin-clavulanate (AUGMENTIN) 875-125 MG tablet Take 1 tablet by mouth every 12 (twelve) hours. 14 tablet Rebel Laughridge, [place], [place]      PDMP not reviewed this encounter.   Letrell Attwood, [place] N, [place] 10/22/23 1729

## 2023-10-22 NOTE — ED Triage Notes (Signed)
Pt c/o cough, sore throat, congestion since last Wednesday.

## 2023-11-14 DIAGNOSIS — M9901 Segmental and somatic dysfunction of cervical region: Secondary | ICD-10-CM | POA: Diagnosis not present

## 2023-11-14 DIAGNOSIS — M7918 Myalgia, other site: Secondary | ICD-10-CM | POA: Diagnosis not present

## 2023-11-14 DIAGNOSIS — M5412 Radiculopathy, cervical region: Secondary | ICD-10-CM | POA: Diagnosis not present

## 2023-11-14 DIAGNOSIS — M9902 Segmental and somatic dysfunction of thoracic region: Secondary | ICD-10-CM | POA: Diagnosis not present

## 2023-11-23 ENCOUNTER — Ambulatory Visit: Payer: BC Managed Care – PPO | Admitting: Family Medicine

## 2023-11-27 DIAGNOSIS — K644 Residual hemorrhoidal skin tags: Secondary | ICD-10-CM | POA: Diagnosis not present

## 2023-11-27 DIAGNOSIS — M5412 Radiculopathy, cervical region: Secondary | ICD-10-CM | POA: Diagnosis not present

## 2023-11-27 DIAGNOSIS — M9902 Segmental and somatic dysfunction of thoracic region: Secondary | ICD-10-CM | POA: Diagnosis not present

## 2023-11-27 DIAGNOSIS — M546 Pain in thoracic spine: Secondary | ICD-10-CM | POA: Diagnosis not present

## 2023-11-27 DIAGNOSIS — M7918 Myalgia, other site: Secondary | ICD-10-CM | POA: Diagnosis not present

## 2023-11-27 DIAGNOSIS — M9901 Segmental and somatic dysfunction of cervical region: Secondary | ICD-10-CM | POA: Diagnosis not present

## 2023-11-29 DIAGNOSIS — M5412 Radiculopathy, cervical region: Secondary | ICD-10-CM | POA: Diagnosis not present

## 2023-11-29 DIAGNOSIS — M25512 Pain in left shoulder: Secondary | ICD-10-CM | POA: Diagnosis not present

## 2023-11-29 DIAGNOSIS — M9901 Segmental and somatic dysfunction of cervical region: Secondary | ICD-10-CM | POA: Diagnosis not present

## 2023-11-29 DIAGNOSIS — M7918 Myalgia, other site: Secondary | ICD-10-CM | POA: Diagnosis not present

## 2023-11-29 DIAGNOSIS — M9902 Segmental and somatic dysfunction of thoracic region: Secondary | ICD-10-CM | POA: Diagnosis not present

## 2023-11-29 DIAGNOSIS — M542 Cervicalgia: Secondary | ICD-10-CM | POA: Diagnosis not present

## 2023-12-27 DIAGNOSIS — M5412 Radiculopathy, cervical region: Secondary | ICD-10-CM | POA: Diagnosis not present

## 2023-12-27 DIAGNOSIS — M7918 Myalgia, other site: Secondary | ICD-10-CM | POA: Diagnosis not present

## 2023-12-27 DIAGNOSIS — M9901 Segmental and somatic dysfunction of cervical region: Secondary | ICD-10-CM | POA: Diagnosis not present

## 2023-12-27 DIAGNOSIS — M25512 Pain in left shoulder: Secondary | ICD-10-CM | POA: Diagnosis not present

## 2023-12-27 DIAGNOSIS — M9902 Segmental and somatic dysfunction of thoracic region: Secondary | ICD-10-CM | POA: Diagnosis not present

## 2024-01-16 DIAGNOSIS — M19021 Primary osteoarthritis, right elbow: Secondary | ICD-10-CM | POA: Diagnosis not present

## 2024-01-16 DIAGNOSIS — M7711 Lateral epicondylitis, right elbow: Secondary | ICD-10-CM | POA: Diagnosis not present

## 2024-01-29 DIAGNOSIS — M25512 Pain in left shoulder: Secondary | ICD-10-CM | POA: Diagnosis not present

## 2024-01-29 DIAGNOSIS — M7918 Myalgia, other site: Secondary | ICD-10-CM | POA: Diagnosis not present

## 2024-01-29 DIAGNOSIS — M9902 Segmental and somatic dysfunction of thoracic region: Secondary | ICD-10-CM | POA: Diagnosis not present

## 2024-01-29 DIAGNOSIS — M9901 Segmental and somatic dysfunction of cervical region: Secondary | ICD-10-CM | POA: Diagnosis not present

## 2024-01-29 DIAGNOSIS — M5412 Radiculopathy, cervical region: Secondary | ICD-10-CM | POA: Diagnosis not present

## 2024-02-02 ENCOUNTER — Other Ambulatory Visit: Payer: Self-pay | Admitting: Family Medicine

## 2024-02-02 DIAGNOSIS — I10 Essential (primary) hypertension: Secondary | ICD-10-CM

## 2024-02-18 ENCOUNTER — Encounter: Payer: Self-pay | Admitting: Family Medicine

## 2024-02-18 ENCOUNTER — Ambulatory Visit: Admitting: Family Medicine

## 2024-02-18 VITALS — BP 130/82 | HR 70 | Temp 97.7°F | Ht 73.0 in | Wt 253.6 lb

## 2024-02-18 DIAGNOSIS — I10 Essential (primary) hypertension: Secondary | ICD-10-CM | POA: Diagnosis not present

## 2024-02-18 NOTE — Progress Notes (Signed)
 Established Patient Office Visit   Subjective  Patient ID: Luke Hunt, male    DOB: 1971/06/24  Age: 53 y.o. MRN: [id number]  Chief Complaint  Patient presents with   Hypertension    High Blood pressure last week, lightheaded     Pt is a 53 yo male seen for elevated bp.  Pt felt flushed so decided to check BP which was 180/100.  Pt started checking bp more consistently in the last 2 weeks, readings 150s-170s/90-100s.  BP cuff is 60-84 years old. Taking Norvasc 5 mg daily and olmesartan 40 mg daily.  Exercising in the gym lifting weights and doing cardio 7 days/week.  Does not eat extra salt.  May have a protein bar or shake daily.  Eating frozen vegetables, frozen fish, rice cakes.    Patient Active Problem List   Diagnosis Date Noted   History of COVID-19 03/11/2020   Shortness of breath 02/18/2020   Cough 02/18/2020   Wheeze 02/18/2020   COVID-19 02/18/2020   Prediabetes 08/25/2019   Constipation 03/20/2016   Alcohol abuse 03/20/2016   OSA (obstructive sleep apnea) 05/23/2012   ALLERGIC RHINITIS 01/09/2008   Obesity 07/05/2007   GERD 07/05/2007   Hyperlipemia 07/04/2007   Essential hypertension 07/04/2007   Past Medical History:  Diagnosis Date   Chronic constipation    intermittent   Environmental allergies    GERD    Gout    Hyperlipemia    Hypertension    Obesity    OSA (obstructive sleep apnea) 05/23/2012   on CPAP   Syncope    related to blood draws, stitches   Past Surgical History:  Procedure Laterality Date   APPENDECTOMY     Rigth knee arthroscopy     @ [facility] [facility]   VASECTOMY     10/2017-[facility]-per patient   Social History   Tobacco Use   Smoking status: Former    Current packs/day: 0.00    Types: Cigarettes    Start date: 02/16/2000    Quit date: 02/15/2006    Years since quitting: 18.0   Smokeless tobacco: Never  Vaping Use   Vaping status: Never Used  Substance Use Topics   Alcohol use: Yes    Alcohol/week: 14.0 standard  drinks of alcohol    Types: 14 Standard drinks or equivalent per week    Comment: 2 per day   Drug use: No   Family History  Problem Relation Age of Onset   Stroke Maternal Grandmother    Diabetes Mother    Breast cancer Mother    Heart disease Maternal Grandfather    Diabetes Maternal Grandfather    Cystic fibrosis Maternal Aunt    Diabetes Brother    No Known Allergies    ROS Negative unless stated above    Objective:     BP 130/82 (BP Location: Left Arm, Patient Position: Sitting, Cuff Size: Large)   Pulse 70   Temp 97.7 F (36.5 C) (Oral)   Ht 6\' 1"  (1.854 m)   Wt 253 lb 9.6 oz (115 kg)   SpO2 98%   BMI 33.46 kg/m  BP Readings from Last 3 Encounters:  02/18/24 130/82  10/22/23 129/76  09/20/23 114/80   Wt Readings from Last 3 Encounters:  02/18/24 253 lb 9.6 oz (115 kg)  09/20/23 244 lb 9.6 oz (110.9 kg)  06/12/23 239 lb 9.6 oz (108.7 kg)      Physical Exam Constitutional:      Appearance: Normal appearance.  Comments: Muscular build.  HENT:     Head: Normocephalic and atraumatic.     Nose: Nose normal.     Mouth/Throat:     Mouth: Mucous membranes are moist.  Cardiovascular:     Rate and Rhythm: Normal rate.  Pulmonary:     Effort: Pulmonary effort is normal.  Musculoskeletal:        General: Normal range of motion.  Skin:    General: Skin is warm and dry.  Neurological:     Mental Status: He is alert and oriented to person, place, and time.  Psychiatric:        Behavior: Behavior is cooperative.      No results found for any visits on 02/18/24.    Assessment & Plan:  Essential hypertension  Patient getting elevated BP readings at home.  BP controlled in clinic.  Discussed continuing Norvasc 5 mg daily and olmesartan 40 mg daily.  Patient will decrease alcohol intake and increase water intake.  Continue monitoring BP at home.  Bring BP cuff to next OFV.  For readings at home consistently greater than 140/90 patient to notify clinic  for med adjustment.  Pt would like to avoid increasing Norvasc as concerned about LE edema.  If needed consider HCTZ 12.5 mg daily or hydralazine.  Would avoid beta-blocker due to current workup routine and spironolactone as may affect testosterone levels.  Return in about 8 weeks (around 04/14/2024), or if symptoms worsen or fail to improve.,  Sooner if needed  Viola Greulich, MD

## 2024-02-19 DIAGNOSIS — M9901 Segmental and somatic dysfunction of cervical region: Secondary | ICD-10-CM | POA: Diagnosis not present

## 2024-02-19 DIAGNOSIS — M5412 Radiculopathy, cervical region: Secondary | ICD-10-CM | POA: Diagnosis not present

## 2024-02-19 DIAGNOSIS — M9902 Segmental and somatic dysfunction of thoracic region: Secondary | ICD-10-CM | POA: Diagnosis not present

## 2024-02-19 DIAGNOSIS — M7918 Myalgia, other site: Secondary | ICD-10-CM | POA: Diagnosis not present

## 2024-03-12 ENCOUNTER — Encounter: Payer: Self-pay | Admitting: Family Medicine

## 2024-05-08 NOTE — Progress Notes (Signed)
 [facility] Quality Team Note  Name: Luke Hunt Date of Birth: 06-Sep-1971 MRN: [id number] Date: 05/08/2024  [facility] Quality Team has reviewed this patient's chart, please see recommendations below:  [facility] Quality Other; (CHART REVIEWED. ABSTRACTED MOST RECENT CONTROLLING BLOOD PRESSURE FOR GAP CLOSURE.)

## 2024-05-29 ENCOUNTER — Ambulatory Visit: Admitting: Family Medicine

## 2024-05-29 ENCOUNTER — Encounter: Payer: Self-pay | Admitting: Family Medicine

## 2024-05-29 VITALS — BP 124/82 | HR 80 | Temp 98.6°F | Ht 73.2 in | Wt 246.0 lb

## 2024-05-29 DIAGNOSIS — I10 Essential (primary) hypertension: Secondary | ICD-10-CM | POA: Diagnosis not present

## 2024-05-29 DIAGNOSIS — Z113 Encounter for screening for infections with a predominantly sexual mode of transmission: Secondary | ICD-10-CM | POA: Diagnosis not present

## 2024-05-29 NOTE — Addendum Note (Signed)
 Addended by: MERCER KIRSCH R on: 05/29/2024 01:36 PM   Modules accepted: Orders, Level of Service

## 2024-05-29 NOTE — Progress Notes (Signed)
 Established Patient Office Visit   Subjective  Patient ID: Luke Hunt, male    DOB: 01/19/1971  Age: 53 y.o. MRN: [id number]  Chief Complaint  Patient presents with   Medical Management of Chronic Issues    Patient is here for a 8 week follow-up for BP, patient would like STD testing     Patient is a 53 year old male seen for follow-up on HTN.  States he is feeling great.  In a new relationship and wants to have STI testing.  Patient is not checking BP at home.  Taking Norvasc  5 mg daily, olmesartan  40 mg daily.    Patient Active Problem List   Diagnosis Date Noted   History of COVID-19 03/11/2020   Shortness of breath 02/18/2020   Cough 02/18/2020   Wheeze 02/18/2020   COVID-19 02/18/2020   Prediabetes 08/25/2019   Constipation 03/20/2016   Alcohol abuse 03/20/2016   OSA (obstructive sleep apnea) 05/23/2012   ALLERGIC RHINITIS 01/09/2008   Obesity 07/05/2007   GERD 07/05/2007   Hyperlipemia 07/04/2007   Essential hypertension 07/04/2007   Past Medical History:  Diagnosis Date   Chronic constipation    intermittent   Environmental allergies    GERD    Gout    Hyperlipemia    Hypertension    Obesity    OSA (obstructive sleep apnea) 05/23/2012   on CPAP   Syncope    related to blood draws, stitches   Past Surgical History:  Procedure Laterality Date   APPENDECTOMY     Rigth knee arthroscopy     @ [facility]   VASECTOMY     10/2017-[facility]-per patient   Social History   Tobacco Use   Smoking status: Former    Current packs/day: 0.00    Types: Cigarettes    Start date: 02/16/2000    Quit date: 02/15/2006    Years since quitting: 18.2   Smokeless tobacco: Never  Vaping Use   Vaping status: Never Used  Substance Use Topics   Alcohol use: Yes    Alcohol/week: 14.0 standard drinks of alcohol    Types: 14 Standard drinks or equivalent per week    Comment: 2 per day   Drug use: No   Family History  Problem Relation Age of Onset   Stroke  Maternal Grandmother    Diabetes Mother    Breast cancer Mother    Heart disease Maternal Grandfather    Diabetes Maternal Grandfather    Cystic fibrosis Maternal Aunt    Diabetes Brother    No Known Allergies  ROS Negative unless stated above    Objective:     BP 124/82 (BP Location: Left Arm, Patient Position: Sitting, Cuff Size: Normal)   Pulse 80   Temp 98.6 F (37 C) (Oral)   Ht 6' 1.2 (1.859 m)   Wt 246 lb (111.6 kg)   SpO2 98%   BMI 32.28 kg/m  BP Readings from Last 3 Encounters:  05/29/24 124/82  02/18/24 130/82  10/22/23 129/76   Wt Readings from Last 3 Encounters:  05/29/24 246 lb (111.6 kg)  02/18/24 253 lb 9.6 oz (115 kg)  09/20/23 244 lb 9.6 oz (110.9 kg)      Physical Exam Constitutional:      General: He is not in acute distress.    Appearance: Normal appearance.  HENT:     Head: Normocephalic and atraumatic.     Nose: Nose normal.     Mouth/Throat:     Mouth:  Mucous membranes are moist.  Cardiovascular:     Rate and Rhythm: Normal rate and regular rhythm.     Heart sounds: Normal heart sounds. No murmur heard.    No gallop.  Pulmonary:     Effort: Pulmonary effort is normal. No respiratory distress.     Breath sounds: Normal breath sounds. No wheezing, rhonchi or rales.  Skin:    General: Skin is warm and dry.  Neurological:     Mental Status: He is alert and oriented to person, place, and time.        09/20/2023   10:50 AM 01/29/2023   10:42 AM 01/19/2023    2:47 PM  Depression screen PHQ 2/9  Decreased Interest 0 0 0  Down, Depressed, Hopeless 0 0 1  PHQ - 2 Score 0 0 1  Altered sleeping 1 3 1   Tired, decreased energy 0 0 1  Change in appetite 0 0 0  Feeling bad or failure about yourself  0 0 1  Trouble concentrating 0 0 0  Moving slowly or fidgety/restless 0 0 0  Suicidal thoughts 0 0 0  PHQ-9 Score 1 3 4   Difficult doing work/chores Not difficult at all Somewhat difficult Not difficult at all      09/20/2023   10:50  AM 01/29/2023   10:42 AM 01/19/2023    2:48 PM 08/16/2022    8:48 AM  GAD 7 : Generalized Anxiety Score  Nervous, Anxious, on Edge 0 0 0 3  Control/stop worrying 0 1 0 3  Worry too much - different things 0 1 0 3  Trouble relaxing 0 1 0 2  Restless 0 0 0 2  Easily annoyed or irritable 0 1 0 2  Afraid - awful might happen 0 0 0 2  Total GAD 7 Score 0 4 0 17  Anxiety Difficulty Not difficult at all Not difficult at all Not difficult at all Somewhat difficult     No results found for any visits on 05/29/24.    Assessment & Plan:   Essential hypertension  Routine screening for STI (sexually transmitted infection) -     HIV Antibody (routine testing w rflx) -     RPR -     C. trachomatis/N. gonorrhoeae RNA  BP well-controlled.  Continue Norvasc  5 mg daily and olmesartan  40 mg daily.  Continue lifestyle modifications.  Obtain STI testing.  Follow-up in Nov for CPE.  Return in about 4 months (around 09/29/2024) for physical.   Clotilda JONELLE Single, MD

## 2024-05-29 NOTE — Addendum Note (Signed)
 Addended by: MERCER CLOTILDA SAUNDERS on: 05/29/2024 01:37 PM   Modules accepted: Orders

## 2024-05-30 LAB — RPR: RPR Ser Ql: NONREACTIVE

## 2024-05-30 LAB — HIV ANTIBODY (ROUTINE TESTING W REFLEX): HIV 1&2 Ab, 4th Generation: NONREACTIVE

## 2024-05-31 ENCOUNTER — Encounter: Payer: Self-pay | Admitting: Family Medicine

## 2024-06-01 LAB — URINE CULTURE
MICRO NUMBER:: 16741562
Result:: NO GROWTH
SPECIMEN QUALITY:: ADEQUATE

## 2024-06-01 LAB — C. TRACHOMATIS/N. GONORRHOEAE RNA

## 2024-06-02 ENCOUNTER — Ambulatory Visit: Payer: Self-pay | Admitting: Family Medicine

## 2024-06-02 ENCOUNTER — Encounter: Payer: Self-pay | Admitting: Family Medicine

## 2024-06-02 ENCOUNTER — Other Ambulatory Visit

## 2024-06-02 DIAGNOSIS — Z113 Encounter for screening for infections with a predominantly sexual mode of transmission: Secondary | ICD-10-CM

## 2024-06-02 NOTE — Telephone Encounter (Signed)
 Copied from CRM [id number]. Topic: Clinical - Lab/Test Results >> Jun 02, 2024 12:26 PM Burnard DEL wrote: Reason for CRM: Patient called in and would like to know if he could have lab orders placed for him to get STD checked. He gave a sample on last week but they checked him for UTI instead of STD'S. Patient stated that he isn't feeling to well and wanted to have these test done.

## 2024-06-03 LAB — C. TRACHOMATIS/N. GONORRHOEAE RNA
C. trachomatis RNA, TMA: NOT DETECTED
N. gonorrhoeae RNA, TMA: NOT DETECTED

## 2024-06-06 ENCOUNTER — Ambulatory Visit: Payer: Self-pay | Admitting: Family Medicine

## 2024-06-12 ENCOUNTER — Encounter: Payer: Self-pay | Admitting: Family Medicine

## 2024-06-26 DIAGNOSIS — N50811 Right testicular pain: Secondary | ICD-10-CM | POA: Diagnosis not present

## 2024-07-04 ENCOUNTER — Ambulatory Visit: Admitting: Family Medicine

## 2024-07-08 DIAGNOSIS — S298XXA Other specified injuries of thorax, initial encounter: Secondary | ICD-10-CM | POA: Diagnosis not present

## 2024-07-08 DIAGNOSIS — M533 Sacrococcygeal disorders, not elsewhere classified: Secondary | ICD-10-CM | POA: Diagnosis not present

## 2024-07-09 ENCOUNTER — Ambulatory Visit: Admitting: Family Medicine

## 2024-07-09 ENCOUNTER — Encounter: Payer: Self-pay | Admitting: Family Medicine

## 2024-07-09 VITALS — BP 132/76 | HR 75 | Temp 98.1°F | Ht 73.8 in | Wt 259.0 lb

## 2024-07-09 DIAGNOSIS — K5909 Other constipation: Secondary | ICD-10-CM | POA: Diagnosis not present

## 2024-07-09 DIAGNOSIS — K4090 Unilateral inguinal hernia, without obstruction or gangrene, not specified as recurrent: Secondary | ICD-10-CM | POA: Diagnosis not present

## 2024-07-09 DIAGNOSIS — Z113 Encounter for screening for infections with a predominantly sexual mode of transmission: Secondary | ICD-10-CM | POA: Diagnosis not present

## 2024-07-09 DIAGNOSIS — B009 Herpesviral infection, unspecified: Secondary | ICD-10-CM

## 2024-07-09 MED ORDER — LINACLOTIDE 145 MCG PO CAPS
145.0000 ug | ORAL_CAPSULE | Freq: Every day | ORAL | 3 refills | Status: DC
Start: 2024-07-09 — End: 2024-09-29

## 2024-07-09 NOTE — Patient Instructions (Signed)
 You had a colonoscopy in 2019.  Per those recommendations you were to repeat testing in 5 years.  As this is soon in the typical 10 years as may not cover cost.  You can check with them to see if they are willing to.
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
## 2024-07-10 LAB — HSV(HERPES SIMPLEX VRS) I + II AB-IGG
HSV 1 IGG,TYPE SPECIFIC AB: >58 {index} — ABNORMAL HIGH
HSV 2 IGG,TYPE SPECIFIC AB: <0.9 {index}

## 2024-07-14 ENCOUNTER — Ambulatory Visit: Payer: Self-pay | Admitting: Family Medicine

## 2024-07-26 ENCOUNTER — Encounter: Payer: Self-pay | Admitting: Family Medicine

## 2024-07-30 ENCOUNTER — Other Ambulatory Visit (HOSPITAL_COMMUNITY): Payer: Self-pay

## 2024-07-31 ENCOUNTER — Other Ambulatory Visit (HOSPITAL_COMMUNITY): Payer: Self-pay

## 2024-07-31 ENCOUNTER — Telehealth: Payer: Self-pay

## 2024-07-31 NOTE — Telephone Encounter (Signed)
 Pharmacy Patient Advocate Encounter   Received notification from Pt Calls Messages that prior authorization for Linzess  145 is required/requested.   Insurance verification completed.   The patient is insured through [facility] .   Per test claim: Per test claim, medication is not covered due to plan/benefit exclusion, PA not submitted at this time

## 2024-08-22 DIAGNOSIS — K4090 Unilateral inguinal hernia, without obstruction or gangrene, not specified as recurrent: Secondary | ICD-10-CM | POA: Diagnosis not present

## 2024-09-22 ENCOUNTER — Encounter: Admitting: Family Medicine

## 2024-09-24 ENCOUNTER — Encounter: Admitting: Family Medicine

## 2024-09-29 ENCOUNTER — Encounter: Payer: Self-pay | Admitting: Family Medicine

## 2024-09-29 ENCOUNTER — Ambulatory Visit (INDEPENDENT_AMBULATORY_CARE_PROVIDER_SITE_OTHER): Admitting: Family Medicine

## 2024-09-29 VITALS — BP 130/72 | HR 87 | Temp 98.8°F | Ht 73.8 in | Wt 251.4 lb

## 2024-09-29 DIAGNOSIS — E782 Mixed hyperlipidemia: Secondary | ICD-10-CM | POA: Diagnosis not present

## 2024-09-29 DIAGNOSIS — Z0000 Encounter for general adult medical examination without abnormal findings: Secondary | ICD-10-CM

## 2024-09-29 DIAGNOSIS — I10 Essential (primary) hypertension: Secondary | ICD-10-CM | POA: Diagnosis not present

## 2024-09-29 DIAGNOSIS — Z125 Encounter for screening for malignant neoplasm of prostate: Secondary | ICD-10-CM

## 2024-09-29 DIAGNOSIS — Z113 Encounter for screening for infections with a predominantly sexual mode of transmission: Secondary | ICD-10-CM

## 2024-09-29 LAB — COMPREHENSIVE METABOLIC PANEL WITH GFR
ALT: 25 U/L (ref 0–53)
AST: 26 U/L (ref 0–37)
Albumin: 4.5 g/dL (ref 3.5–5.2)
Alkaline Phosphatase: 43 U/L (ref 39–117)
BUN: 15 mg/dL (ref 6–23)
CO2: 33 meq/L — ABNORMAL HIGH (ref 19–32)
Calcium: 10.3 mg/dL (ref 8.4–10.5)
Chloride: 98 meq/L (ref 96–112)
Creatinine, Ser: 1.34 mg/dL (ref 0.40–1.50)
GFR: 60.81 mL/min (ref >60.00)
Glucose, Bld: 117 mg/dL — ABNORMAL HIGH (ref 70–99)
Potassium: 5.4 meq/L — ABNORMAL HIGH (ref 3.5–5.1)
Sodium: 135 meq/L (ref 135–145)
Total Bilirubin: 1.2 mg/dL (ref 0.2–1.2)
Total Protein: 7.4 g/dL (ref 6.0–8.3)

## 2024-09-29 LAB — CBC WITH DIFFERENTIAL/PLATELET
Basophils Absolute: 0 K/uL (ref 0.0–0.1)
Basophils Relative: 0.7 % (ref 0.0–3.0)
Eosinophils Absolute: 0.1 K/uL (ref 0.0–0.7)
Eosinophils Relative: 2.4 % (ref 0.0–5.0)
HCT: 45.4 % (ref 39.0–52.0)
Hemoglobin: 15.3 g/dL (ref 13.0–17.0)
Lymphocytes Relative: 22 % (ref 12.0–46.0)
Lymphs Abs: 1.2 K/uL (ref 0.7–4.0)
MCHC: 33.7 g/dL (ref 30.0–36.0)
MCV: 93.2 fl (ref 78.0–100.0)
Monocytes Absolute: 0.5 K/uL (ref 0.1–1.0)
Monocytes Relative: 9.5 % (ref 3.0–12.0)
Neutro Abs: 3.6 K/uL (ref 1.4–7.7)
Neutrophils Relative %: 65.4 % (ref 43.0–77.0)
Platelets: 233 K/uL (ref 150.0–400.0)
RBC: 4.87 Mil/uL (ref 4.22–5.81)
RDW: 14.2 % (ref 11.5–15.5)
WBC: 5.5 K/uL (ref 4.0–10.5)

## 2024-09-29 LAB — LIPID PANEL
Cholesterol: 272 mg/dL — ABNORMAL HIGH (ref 0–200)
HDL: 64.4 mg/dL (ref >39.00)
LDL Cholesterol: 193 mg/dL — ABNORMAL HIGH (ref 0–99)
NonHDL: 208.08
Total CHOL/HDL Ratio: 4
Triglycerides: 77 mg/dL (ref 0.0–149.0)
VLDL: 15.4 mg/dL (ref 0.0–40.0)

## 2024-09-29 LAB — HEMOGLOBIN A1C: Hgb A1c MFr Bld: 5.4 % (ref 4.6–6.5)

## 2024-09-29 LAB — T4, FREE: Free T4: 0.66 ng/dL (ref 0.60–1.60)

## 2024-09-29 LAB — TSH: TSH: 1.81 u[IU]/mL (ref 0.35–5.50)

## 2024-09-29 LAB — PSA: PSA: 0.86 ng/mL (ref 0.10–4.00)

## 2024-09-29 MED ORDER — AMLODIPINE BESYLATE 5 MG PO TABS: ORAL_TABLET | ORAL | 3 refills | Status: AC

## 2024-09-29 MED ORDER — OLMESARTAN MEDOXOMIL 40 MG PO TABS: 40.0000 mg | ORAL_TABLET | Freq: Every day | ORAL | 3 refills | Status: AC

## 2024-09-29 NOTE — Progress Notes (Signed)
 Established Patient Office Visit   Subjective  Patient ID: Luke Hunt, male    DOB: 1971-07-16  Age: 53 y.o. MRN: [id number]  Chief Complaint  Patient presents with   Annual Exam    Patient is a 53 year old male seen for CPE.  Patient is fasting.  Patient states he feels great overall.  Working on wt loss.  Pt cut out some of the supplements he was taking such as creatine.  May use a colon cleanse one a month which helps with bowels.  Tried linzess  in the past for chronic constipation. Followed by GI.  Repeat colonoscopy due.  Would like routine screenings.  Starting a new job in [place].  Will be commuting.      Patient Active Problem List   Diagnosis Date Noted   History of COVID-19 03/11/2020   Shortness of breath 02/18/2020   Cough 02/18/2020   Wheeze 02/18/2020   COVID-19 02/18/2020   Prediabetes 08/25/2019   Constipation 03/20/2016   Alcohol abuse 03/20/2016   OSA (obstructive sleep apnea) 05/23/2012   ALLERGIC RHINITIS 01/09/2008   Obesity 07/05/2007   GERD 07/05/2007   Hyperlipemia 07/04/2007   Essential hypertension 07/04/2007   Past Medical History:  Diagnosis Date   Chronic constipation    intermittent   Environmental allergies    GERD    Gout    Hyperlipemia    Hypertension    Obesity    OSA (obstructive sleep apnea) 05/23/2012   on CPAP   Syncope    related to blood draws, stitches   Past Surgical History:  Procedure Laterality Date   APPENDECTOMY     Rigth knee arthroscopy     @ [facility]   VASECTOMY     10/2017-[facility]-per patient   Social History   Tobacco Use   Smoking status: Former    Current packs/day: 0.00    Types: Cigarettes    Start date: 02/16/2000    Quit date: 02/15/2006    Years since quitting: 18.6   Smokeless tobacco: Never  Vaping Use   Vaping status: Never Used  Substance Use Topics   Alcohol use: Yes    Alcohol/week: 14.0 standard drinks of alcohol    Types: 14 Standard drinks or equivalent per week     Comment: 2 per day   Drug use: No   Family History  Problem Relation Age of Onset   Stroke Maternal Grandmother    Diabetes Mother    Breast cancer Mother    Heart disease Maternal Grandfather    Diabetes Maternal Grandfather    Cystic fibrosis Maternal Aunt    Diabetes Brother    No Known Allergies  ROS Negative unless stated above    Objective:     BP 130/72 (BP Location: Left Arm, Patient Position: Sitting, Cuff Size: Large)   Pulse 87   Temp 98.8 F (37.1 C) (Oral)   Ht 6' 1.8 (1.875 m)   Wt 251 lb 6.4 oz (114 kg)   SpO2 98%   BMI 32.45 kg/m  BP Readings from Last 3 Encounters:  09/29/24 130/72  07/09/24 132/76  05/29/24 124/82   Wt Readings from Last 3 Encounters:  09/29/24 251 lb 6.4 oz (114 kg)  07/09/24 259 lb (117.5 kg)  05/29/24 246 lb (111.6 kg)      Physical Exam Constitutional:      Appearance: Normal appearance.  HENT:     Head: Normocephalic and atraumatic.     Right Ear: Tympanic membrane, ear  canal and external ear normal.     Left Ear: Tympanic membrane, ear canal and external ear normal.     Nose: Nose normal.     Mouth/Throat:     Mouth: Mucous membranes are moist.     Pharynx: No oropharyngeal exudate or posterior oropharyngeal erythema.  Eyes:     General: No scleral icterus.    Extraocular Movements: Extraocular movements intact.     Conjunctiva/sclera: Conjunctivae normal.     Pupils: Pupils are equal, round, and reactive to light.  Neck:     Thyroid : No thyromegaly.     Vascular: No carotid bruit.  Cardiovascular:     Rate and Rhythm: Normal rate and regular rhythm.     Pulses: Normal pulses.     Heart sounds: Normal heart sounds. No murmur heard.    No friction rub.  Pulmonary:     Effort: Pulmonary effort is normal.     Breath sounds: Normal breath sounds. No wheezing, rhonchi or rales.  Abdominal:     General: Bowel sounds are normal.     Palpations: Abdomen is soft.     Tenderness: There is no abdominal  tenderness.  Musculoskeletal:        General: No deformity. Normal range of motion.  Lymphadenopathy:     Cervical: No cervical adenopathy.  Skin:    General: Skin is warm and dry.     Findings: No lesion.  Neurological:     General: No focal deficit present.     Mental Status: He is alert and oriented to person, place, and time.  Psychiatric:        Mood and Affect: Mood normal.        Thought Content: Thought content normal.        09/29/2024   10:12 AM 07/09/2024    3:21 PM 05/29/2024    1:41 PM  Depression screen PHQ 2/9  Decreased Interest 0 0 0  Down, Depressed, Hopeless 0 0 0  PHQ - 2 Score 0 0 0  Altered sleeping 0 0 1  Tired, decreased energy 0 0 1  Change in appetite 0 0 0  Feeling bad or failure about yourself  0 0 0  Trouble concentrating 0 0 0  Moving slowly or fidgety/restless 0 0 0  Suicidal thoughts 0 0 0  PHQ-9 Score 0 0  2   Difficult doing work/chores Not difficult at all       Data saved with a previous flowsheet row definition      09/29/2024   10:12 AM 07/09/2024    3:21 PM 05/29/2024    1:41 PM 09/20/2023   10:50 AM  GAD 7 : Generalized Anxiety Score  Nervous, Anxious, on Edge 0 0 0 0  Control/stop worrying 0 0 0 0  Worry too much - different things 0 1 0 0  Trouble relaxing 0 0 0 0  Restless 0 0 0 0  Easily annoyed or irritable 0 1 0 0  Afraid - awful might happen 0 0 0 0  Total GAD 7 Score 0 2 0 0  Anxiety Difficulty Not difficult at all Not difficult at all Not difficult at all Not difficult at all     No results found for any visits on 09/29/24.    Assessment & Plan:   Well adult exam -     Comprehensive metabolic panel with GFR; Future -     CBC with Differential/Platelet; Future -     Hemoglobin A1c;  Future -     TSH; Future -     T4, free; Future  Essential hypertension -     Comprehensive metabolic panel with GFR; Future -     TSH; Future -     T4, free; Future -     amLODIPine  Besylate; TAKE 1 TABLET(5 MG) BY MOUTH  DAILY  Dispense: 90 tablet; Refill: 3 -     Olmesartan  Medoxomil; Take 1 tablet (40 mg total) by mouth daily.  Dispense: 90 tablet; Refill: 3  Mixed hyperlipidemia -     Lipid panel; Future  Routine screening for STI (sexually transmitted infection) -     C. trachomatis/N. gonorrhoeae RNA; Future -     RPR W/RFLX TO RPR TITER, TREPONEMAL AB, SCREEN AND DIAGNOSIS; Future -     HIV Antibody (routine testing w rflx); Future  Screening for prostate cancer -     PSA; Future  Age appropriate health screenings discussed.  Obtain labs.  Immunizations reviewed.  Pt declines.  Colonoscopy done 04/08/2018.  Repeat due in 5 yrs.  Concern regarding insurance coverage.  Will have new insurance in 2026, will schedule then.  BP controlled.  Continue olmesartan  40 mg daily, Norvasc  5 mg daily, and omega-3 fatty acids for cholesterol.  Continue lifestyle modifications including diet changes.  Return in about 1 year (around 09/29/2025) for physical.  Sooner if needed.   Luke JONELLE Single, MD

## 2024-09-30 LAB — C. TRACHOMATIS/N. GONORRHOEAE RNA
C. trachomatis RNA, TMA: NOT DETECTED
N. gonorrhoeae RNA, TMA: NOT DETECTED

## 2024-09-30 LAB — SYPHILIS: RPR W/REFLEX TO RPR TITER AND TREPONEMAL ANTIBODIES, TRADITIONAL SCREENING AND DIAGNOSIS ALGORITHM: RPR Ser Ql: NONREACTIVE

## 2024-09-30 LAB — HIV ANTIBODY (ROUTINE TESTING W REFLEX)
HIV 1&2 Ab, 4th Generation: NONREACTIVE
HIV FINAL INTERPRETATION: NEGATIVE

## 2024-10-01 ENCOUNTER — Ambulatory Visit: Payer: Self-pay | Admitting: Family Medicine

## 2024-10-01 ENCOUNTER — Encounter: Admitting: Family Medicine

## 2024-10-01 NOTE — Telephone Encounter (Signed)
 FYI Only or Action Required?: Action required by provider: Requesting ABx.  Patient was last seen in primary care on 09/29/2024 by Mercer Clotilda SAUNDERS, MD.  Called Nurse Triage reporting Shortness of Breath.  Symptoms began a week ago.  Interventions attempted: Nothing.  Symptoms are: gradually worsening.  Triage Disposition: See PCP Within 2 Weeks  Patient/caregiver understands and will follow disposition?: YesReason for Disposition  [1] MILD longstanding difficulty breathing (e.g., minimal/no SOB at rest, SOB with walking, pulse < 100) AND [2] SAME as normal    SOB due to nose being stuffed  Additional Information  Commented on: Answer Assessment    States his daughter is sick. Patient starts new job Monday so insurance ends Friday. Patient states his cholesterol labs were high and was wondering if he needs to be on medication. States PCP checked him out on Tuesday at physical and did not see or hear anything but patient feels its getting worse, he think he has a chest infection. He is requesting medication for these symptoms to [facility] on file. Please advise. Advised UC if symptoms persist or woresn  1. RESPIRATORY STATUS: Describe your breathing? (e.g., wheezing, shortness of breath, unable to speak, severe coughing)        Coughing, yellow-ish mucous, stuffyness in nose, more difficult to breath through nose, mouth breathing  2. ONSET: When did this breathing problem begin?             Week  Protocols used: Breathing Difficulty-A-AH

## 2024-10-01 NOTE — Telephone Encounter (Signed)
 Patient disconnected before transfer to triage; call attempted x 1, no answer; LVM. Will attempt contact at a later time.         Copied from CRM #[id number]. Topic: Clinical - Red Word Triage >> Oct 01, 2024  8:08 AM Franky GRADE wrote: Red Word that prompted transfer to Nurse Triage: Patient believes he may be experiencing a chest infection, difficulty breathing.

## 2024-10-22 ENCOUNTER — Ambulatory Visit: Payer: Self-pay | Admitting: Family Medicine

## 2024-10-22 DIAGNOSIS — E875 Hyperkalemia: Secondary | ICD-10-CM

## 2024-11-04 MED ORDER — ATORVASTATIN CALCIUM 40 MG PO TABS: 40.0000 mg | ORAL_TABLET | Freq: Every day | ORAL | 3 refills | Status: AC

## 2024-11-04 NOTE — Addendum Note (Signed)
 Addended by: BRIEN SONG A on: 11/04/2024 02:44 PM   Modules accepted: Orders
